# Patient Record
Sex: Male | Born: 1955 | Race: White | Hispanic: No | Marital: Married | Smoking: Never smoker
Health system: Southern US, Community
[De-identification: ages and names within clinical notes are randomized; demographics above are authoritative.]

## PROBLEM LIST (undated history)

## (undated) DIAGNOSIS — E039 Hypothyroidism, unspecified: Secondary | ICD-10-CM

## (undated) DIAGNOSIS — Z86018 Personal history of other benign neoplasm: Secondary | ICD-10-CM

## (undated) DIAGNOSIS — K219 Gastro-esophageal reflux disease without esophagitis: Secondary | ICD-10-CM

## (undated) DIAGNOSIS — Z85828 Personal history of other malignant neoplasm of skin: Secondary | ICD-10-CM

## (undated) HISTORY — DX: Personal history of other malignant neoplasm of skin: Z85.828

## (undated) HISTORY — PX: WISDOM TOOTH EXTRACTION: SHX21

## (undated) HISTORY — PX: ESOPHAGOGASTRODUODENOSCOPY (EGD) WITH ESOPHAGEAL DILATION: SHX5812

---
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
## 2006-09-25 ENCOUNTER — Ambulatory Visit: Payer: Self-pay | Admitting: Internal Medicine

## 2006-10-16 ENCOUNTER — Ambulatory Visit: Payer: Self-pay | Admitting: Internal Medicine

## 2006-10-16 LAB — HM COLONOSCOPY: HM COLON: NORMAL

## 2006-11-05 DIAGNOSIS — D239 Other benign neoplasm of skin, unspecified: Secondary | ICD-10-CM

## 2006-11-05 HISTORY — DX: Other benign neoplasm of skin, unspecified: D23.9

## 2012-10-30 ENCOUNTER — Ambulatory Visit: Payer: Self-pay | Admitting: Gastroenterology

## 2012-10-30 IMAGING — RF DG BARIUM SWALLOW
2 series · 8 of 8 positions shown · non-contrast
Comparison: none

REASON FOR EXAM: Dysphagia WIth Tablet
COMMENTS:

PROCEDURE:     FL  - FL BARIUM SWALLOW  - [DATE] [DATE]
RESULT:     Comparison: None.
TECHNIQUE: Standard double contrast barium swallow was performed, with
monitoring via abdomen fluoroscopy.

[Series 1: fluoro_barium 2fps_bw · 0.17mm/px · 4 of 14 frames shown (1 of 2)]
[frame 3/14]
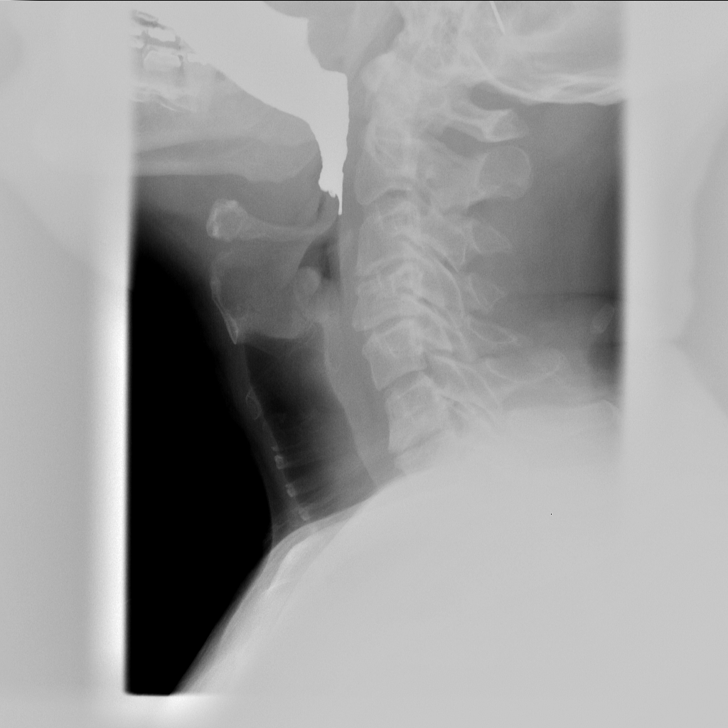
[frame 5/14]
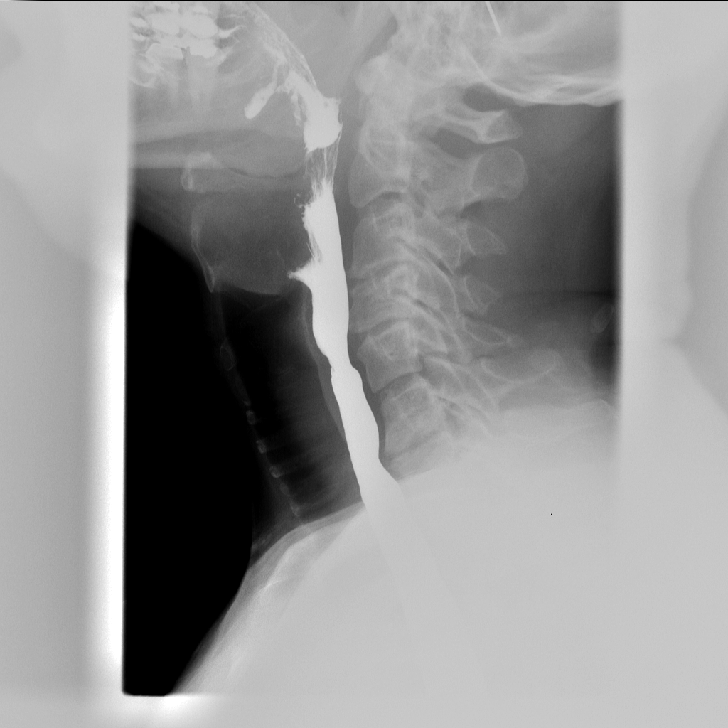
[frame 8/14]
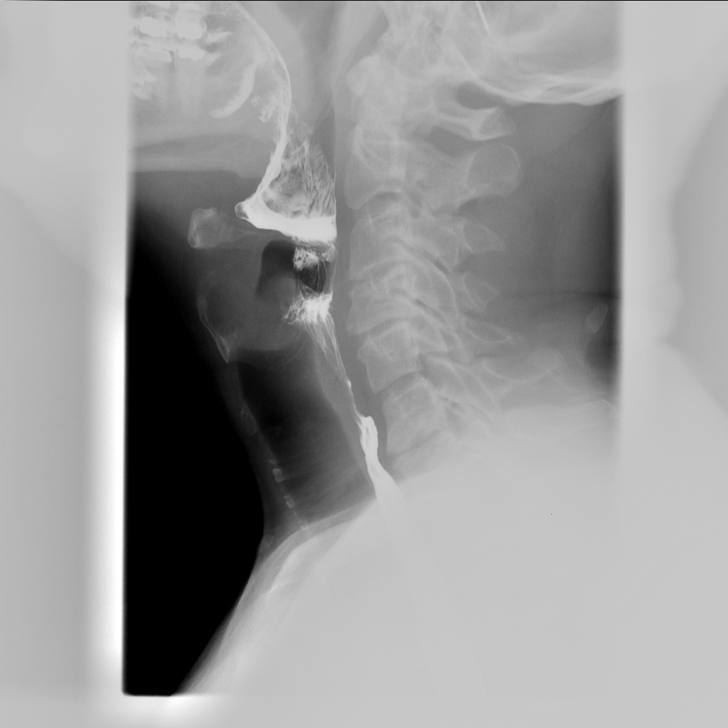
[frame 12/14]
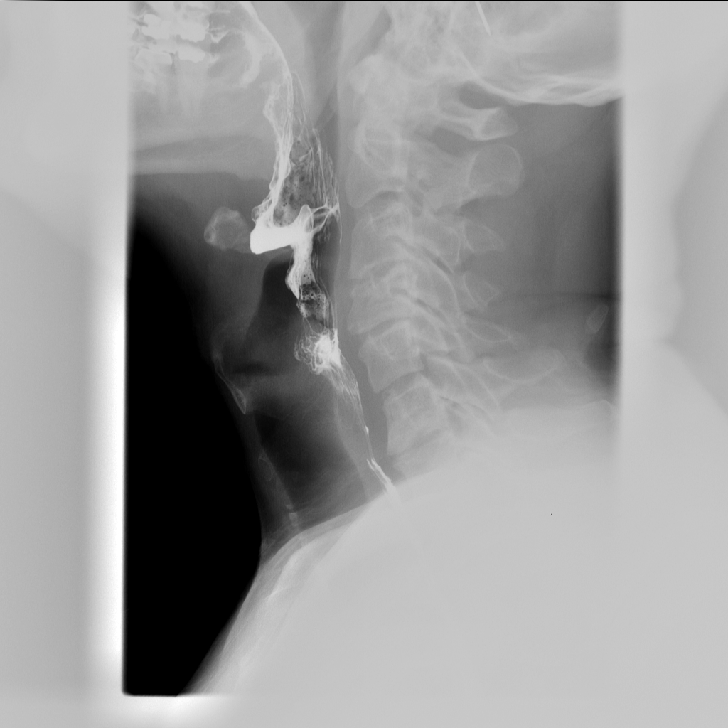

[Series 2: fluoro_barium 2fps_bw · 0.17mm/px · 4 of 12 frames shown (2 of 2)]
[frame 2/12]
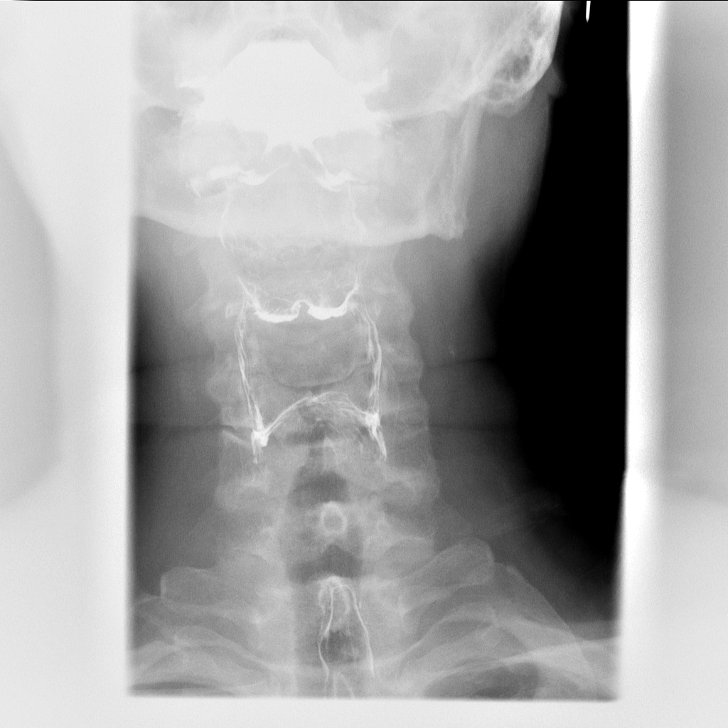
[frame 5/12]
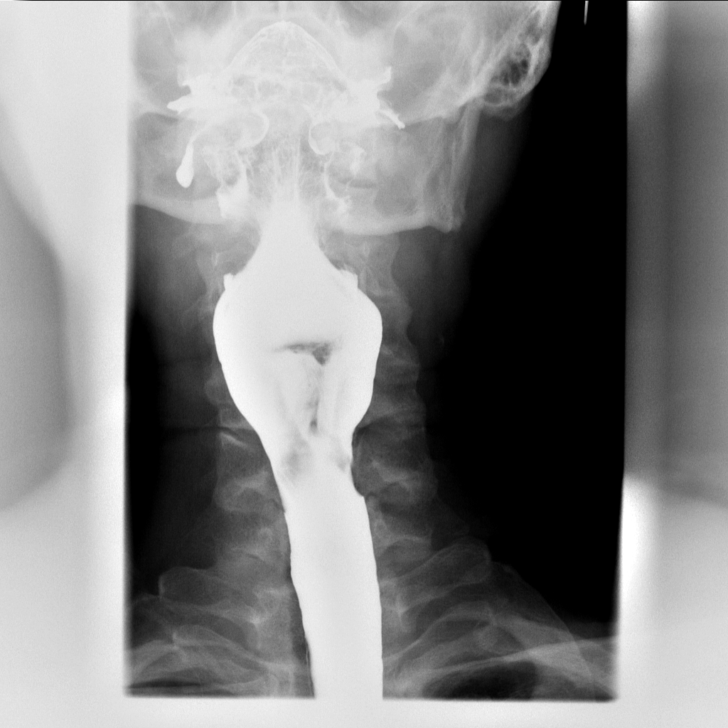
[frame 7/12]
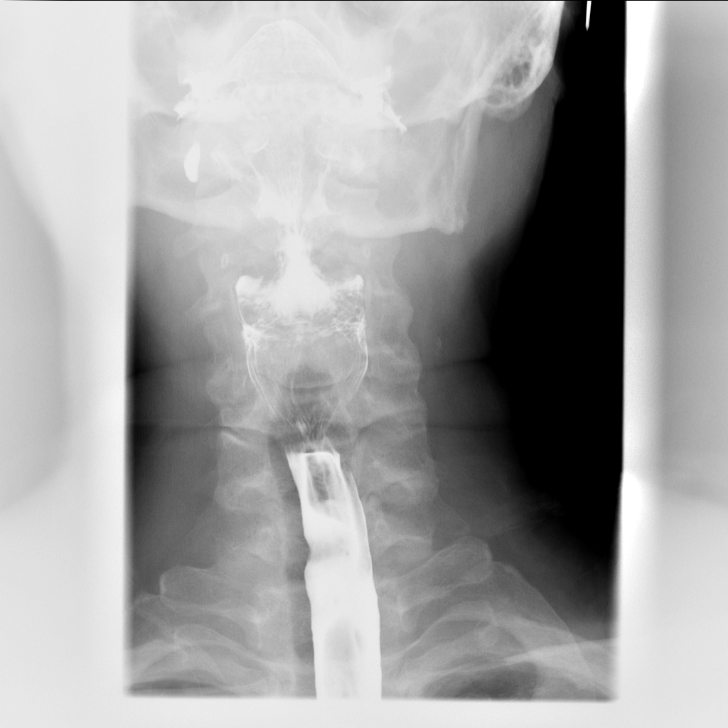
[frame 11/12]
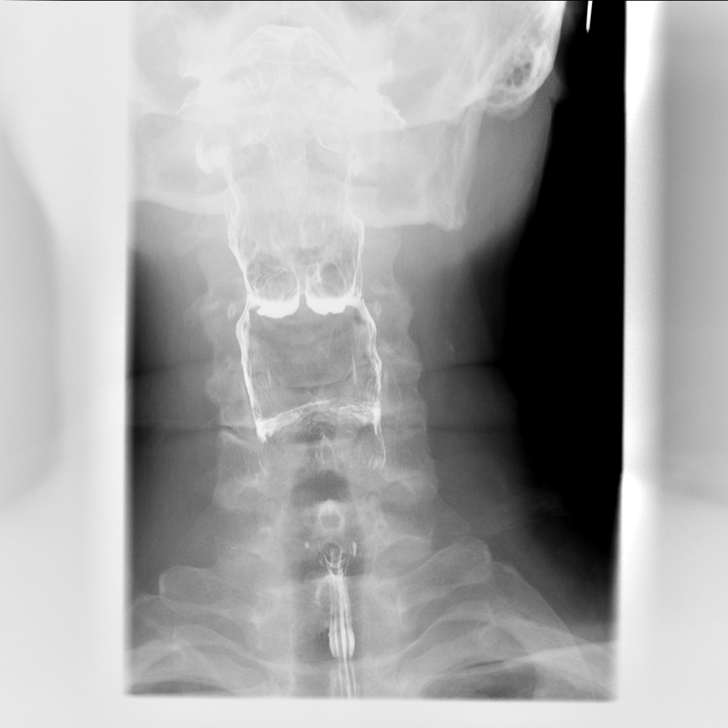

[8 of 8 positions shown; findings below may reference images not displayed]

FINDINGS: There is normal passage of barium contrast material through the hypopharynx
and into the cervical esophagus. Mild multilevel degenerative disc disease
is seen in the cervical spine.

The thoracic esophagus was normal in caliber. No intraluminal filling defect
or ulceration identified. There is a small sliding-type hiatal hernia. The
gastroesophageal junction was normal in caliber. There was gastroesophageal
reflux to the level of the superior thoracic esophagus. The patient
swallowed a 13 mm barium tablet which passed freely into the stomach.
IMPRESSION: 1. Small sliding-type hiatal hernia.
2. Full column gastroesophageal reflux.

## 2013-03-10 DIAGNOSIS — C4491 Basal cell carcinoma of skin, unspecified: Secondary | ICD-10-CM

## 2013-03-10 HISTORY — DX: Basal cell carcinoma of skin, unspecified: C44.91

## 2013-11-11 DIAGNOSIS — C439 Malignant melanoma of skin, unspecified: Secondary | ICD-10-CM

## 2013-11-11 DIAGNOSIS — Z85820 Personal history of malignant melanoma of skin: Secondary | ICD-10-CM

## 2013-11-11 HISTORY — DX: Personal history of malignant melanoma of skin: Z85.820

## 2013-11-11 HISTORY — DX: Malignant melanoma of skin, unspecified: C43.9

## 2013-11-24 ENCOUNTER — Encounter: Payer: Self-pay | Admitting: Dermatology

## 2013-11-24 HISTORY — PX: MELANOMA EXCISION: SHX5266

## 2014-10-19 LAB — TSH: TSH: 2.45 u[IU]/mL (ref 0.41–5.90)

## 2014-10-19 LAB — BASIC METABOLIC PANEL
BUN: 17 mg/dL (ref 4–21)
Creatinine: 1.2 mg/dL (ref 0.6–1.3)
Glucose: 96 mg/dL
POTASSIUM: 4.4 mmol/L (ref 3.4–5.3)
Sodium: 146 mmol/L (ref 137–147)

## 2014-10-19 LAB — CBC AND DIFFERENTIAL
HEMATOCRIT: 44 % (ref 41–53)
HEMOGLOBIN: 15.4 g/dL (ref 13.5–17.5)
Neutrophils Absolute: 3 /uL
PLATELETS: 247 10*3/uL (ref 150–399)
WBC: 5.7 10*3/mL

## 2014-10-19 LAB — LIPID PANEL
CHOLESTEROL: 194 mg/dL (ref 0–200)
HDL: 61 mg/dL (ref 35–70)
LDL CALC: 120 mg/dL
LDL/HDL RATIO: 2
TRIGLYCERIDES: 66 mg/dL (ref 40–160)

## 2014-10-19 LAB — HEPATIC FUNCTION PANEL
ALK PHOS: 55 U/L (ref 25–125)
ALT: 14 U/L (ref 10–40)
AST: 16 U/L (ref 14–40)
Bilirubin, Total: 0.5 mg/dL

## 2014-10-19 LAB — PSA: PSA: 1.1

## 2015-09-19 DIAGNOSIS — M545 Low back pain, unspecified: Secondary | ICD-10-CM | POA: Insufficient documentation

## 2015-09-19 DIAGNOSIS — E039 Hypothyroidism, unspecified: Secondary | ICD-10-CM | POA: Insufficient documentation

## 2015-09-19 DIAGNOSIS — E7800 Pure hypercholesterolemia, unspecified: Secondary | ICD-10-CM | POA: Insufficient documentation

## 2015-09-19 DIAGNOSIS — K219 Gastro-esophageal reflux disease without esophagitis: Secondary | ICD-10-CM | POA: Insufficient documentation

## 2015-09-19 DIAGNOSIS — G8929 Other chronic pain: Secondary | ICD-10-CM | POA: Insufficient documentation

## 2015-09-19 DIAGNOSIS — J309 Allergic rhinitis, unspecified: Secondary | ICD-10-CM | POA: Insufficient documentation

## 2015-09-19 DIAGNOSIS — R609 Edema, unspecified: Secondary | ICD-10-CM | POA: Insufficient documentation

## 2015-09-21 ENCOUNTER — Ambulatory Visit (INDEPENDENT_AMBULATORY_CARE_PROVIDER_SITE_OTHER): Payer: BLUE CROSS/BLUE SHIELD | Admitting: Family Medicine

## 2015-09-21 ENCOUNTER — Encounter: Payer: Self-pay | Admitting: Family Medicine

## 2015-09-21 VITALS — BP 118/78 | HR 66 | Temp 97.8°F | Resp 16 | Wt 210.0 lb

## 2015-09-21 DIAGNOSIS — I499 Cardiac arrhythmia, unspecified: Secondary | ICD-10-CM | POA: Diagnosis not present

## 2015-09-21 DIAGNOSIS — I493 Ventricular premature depolarization: Secondary | ICD-10-CM

## 2015-09-21 NOTE — Progress Notes (Signed)
Patient ID: [name], male   DOB: March 10, 1956, 59 y.o.   MRN: [id number]    Subjective:  HPI Pt is here today because he saw Dr. Myrna Blazer NP and she told him he had an irregular heart beat. He reports that he had been having some chest pain, burning and reflux but he thought it was all related to running out of his Protonix (which is why he went to see Dr. Candace Cruise). Denies any shortness of breath, dizziness, palpitations, or fatigue.  It is notable that the last few years of work up and very stressful for the patient. He frequently works 12 hour days. His wife is retiring early next year and he is planning to do so the next few years but right now the stress level remains high.  Prior to Admission medications   Medication Sig Start Date End Date Taking? Authorizing Provider  aspirin 81 MG tablet Take by mouth.   Yes Historical Provider, MD  levothyroxine (SYNTHROID, LEVOTHROID) 50 MCG tablet Take by mouth. 01/24/15  Yes Historical Provider, MD  Multiple Vitamins tablet Take by mouth.   Yes Historical Provider, MD  pantoprazole (PROTONIX) 40 MG tablet Take by mouth.   Yes Historical Provider, MD    Patient Active Problem List   Diagnosis Date Noted  . Allergic rhinitis 09/19/2015  . Chronic LBP 09/19/2015  . Acid reflux 09/19/2015  . Adult hypothyroidism 09/19/2015  . Edema 09/19/2015  . Pure hypercholesterolemia 09/19/2015    History reviewed. No pertinent past medical history.  Social History   Social History  . Marital Status: Married    Spouse Name: N/A  . Number of Children: N/A  . Years of Education: N/A   Occupational History  . Not on file.   Social History Main Topics  . Smoking status: Never Smoker   . Smokeless tobacco: Not on file  . Alcohol Use: Yes     Comment: occasionally weekends  . Drug Use: No  . Sexual Activity: Not on file   Other Topics Concern  . Not on file   Social History Narrative    No Known Allergies  Review of Systems  Constitutional:  Negative.   HENT: Negative.   Respiratory: Negative.   Cardiovascular: Positive for chest pain.  Gastrointestinal: Positive for heartburn.  Genitourinary: Negative.   Musculoskeletal: Negative.   Skin: Negative.   Neurological: Negative.   Endo/Heme/Allergies: Negative.   Psychiatric/Behavioral: Negative.     Immunization History  Administered Date(s) Administered  . Tdap 12/01/2006  . Zoster 10/19/2014   Objective:  BP 118/78 mmHg  Pulse 66  Temp(Src) 97.8 F (36.6 C) (Oral)  Resp 16  Wt 210 lb (95.255 kg)  SpO2 98%  Physical Exam  Constitutional: He is oriented to person, place, and time and well-developed, well-nourished, and in no distress.  HENT:  Head: Normocephalic and atraumatic.  Right Ear: External ear normal.  Left Ear: External ear normal.  Nose: Nose normal.  Mouth/Throat: Oropharynx is clear and moist.  Eyes: Conjunctivae and EOM are normal. Pupils are equal, round, and reactive to light.  Neck: Normal range of motion. Neck supple.  Cardiovascular: Normal rate, normal heart sounds and intact distal pulses.   Pulmonary/Chest: Effort normal and breath sounds normal.  Abdominal: Soft. Bowel sounds are normal.  Musculoskeletal: Normal range of motion.  Neurological: He is alert and oriented to person, place, and time. He has normal reflexes. Gait normal. GCS score is 15.  Skin: Skin is warm and dry.  Psychiatric:  Mood, memory, affect and judgment normal.    Lab Results  Component Value Date   WBC 5.7 10/19/2014   HGB 15.4 10/19/2014   HCT 44 10/19/2014   PLT 247 10/19/2014   CHOL 194 10/19/2014   TRIG 66 10/19/2014   HDL 61 10/19/2014   LDLCALC 120 10/19/2014   TSH 2.45 10/19/2014   PSA 1.1 10/19/2014    CMP     Component Value Date/Time   NA 146 10/19/2014   K 4.4 10/19/2014   BUN 17 10/19/2014   CREATININE 1.2 10/19/2014   AST 16 10/19/2014   ALT 14 10/19/2014   ALKPHOS 55 10/19/2014    Assessment and Plan :  1. Irregular heart  beat  - EKG 12-Lead - Ambulatory referral to Cardiology - TSH - Renal function panel  2. PVC (premature ventricular contraction) Refer to cardiology for evaluation for her overall cardiac risk assessment and cardiac health. He has already taken aspirin 81 mg daily. no further treatment noted or  needed today - Ambulatory referral to Cardiology I have done the exam and reviewed the above chart and it is accurate to the best of my knowledge.  Patient was seen and examined by Dr. Miguel Aschoff, and noted scribed by Webb Laws, [place] MD [facility] 09/21/2015 2:09 PM

## 2015-09-22 ENCOUNTER — Telehealth: Payer: Self-pay | Admitting: Family Medicine

## 2015-09-22 LAB — RENAL FUNCTION PANEL
Albumin: 4.3 g/dL (ref 3.6–4.8)
BUN / CREAT RATIO: 14 (ref 10–22)
BUN: 15 mg/dL (ref 8–27)
CHLORIDE: 105 mmol/L (ref 96–106)
CO2: 26 mmol/L (ref 18–29)
Calcium: 9.4 mg/dL (ref 8.6–10.2)
Creatinine, Ser: 1.07 mg/dL (ref 0.76–1.27)
GFR calc non Af Amer: 75 mL/min/{1.73_m2} (ref >59)
GFR, EST AFRICAN AMERICAN: 86 mL/min/{1.73_m2} (ref >59)
Glucose: 91 mg/dL (ref 65–99)
Phosphorus: 3.5 mg/dL (ref 2.5–4.5)
Potassium: 4.6 mmol/L (ref 3.5–5.2)
Sodium: 146 mmol/L — ABNORMAL HIGH (ref 134–144)

## 2015-09-22 LAB — TSH: TSH: 2.73 u[IU]/mL (ref 0.450–4.500)

## 2015-09-22 NOTE — Telephone Encounter (Signed)
Pt called saying he was in yesterday and had an EKG that was a little off.  That Dr. Rosanna Randy set him up an appt with a cardiologist at a later date.  He called this morning with a little tightness in his chest a funny feeling in his left hand.  I spoke to [place] and she recomened for his to go to the ER.  I relayed that to Me. Yoak and he said he would do that.  Thanks Con Memos

## 2015-09-22 NOTE — Telephone Encounter (Signed)
FYI

## 2015-09-26 ENCOUNTER — Telehealth: Payer: Self-pay

## 2015-09-26 NOTE — Telephone Encounter (Signed)
Left message to call back  

## 2015-09-26 NOTE — Telephone Encounter (Signed)
-----   Message from Jerrol Banana., MD sent at 09/26/2015  9:26 AM EST ----- Labs ok--normal range. Maybe increase water intake a little.

## 2015-09-27 NOTE — Telephone Encounter (Signed)
Pt advised-aa 

## 2015-10-05 DIAGNOSIS — E782 Mixed hyperlipidemia: Secondary | ICD-10-CM | POA: Insufficient documentation

## 2015-10-06 DIAGNOSIS — I493 Ventricular premature depolarization: Secondary | ICD-10-CM | POA: Insufficient documentation

## 2015-10-06 DIAGNOSIS — R079 Chest pain, unspecified: Secondary | ICD-10-CM | POA: Insufficient documentation

## 2015-10-06 DIAGNOSIS — R002 Palpitations: Secondary | ICD-10-CM | POA: Insufficient documentation

## 2015-10-12 ENCOUNTER — Encounter: Payer: Self-pay | Admitting: Family Medicine

## 2015-10-24 ENCOUNTER — Encounter: Payer: Self-pay | Admitting: Family Medicine

## 2015-10-24 ENCOUNTER — Ambulatory Visit (INDEPENDENT_AMBULATORY_CARE_PROVIDER_SITE_OTHER): Payer: BLUE CROSS/BLUE SHIELD | Admitting: Family Medicine

## 2015-10-24 VITALS — BP 124/78 | HR 78 | Temp 98.1°F | Resp 16 | Ht 74.0 in | Wt 211.0 lb

## 2015-10-24 DIAGNOSIS — Z1211 Encounter for screening for malignant neoplasm of colon: Secondary | ICD-10-CM | POA: Diagnosis not present

## 2015-10-24 DIAGNOSIS — Z125 Encounter for screening for malignant neoplasm of prostate: Secondary | ICD-10-CM | POA: Diagnosis not present

## 2015-10-24 DIAGNOSIS — Z0000 Encounter for general adult medical examination without abnormal findings: Secondary | ICD-10-CM

## 2015-10-24 LAB — POCT URINALYSIS DIPSTICK
Bilirubin, UA: NEGATIVE
GLUCOSE UA: NEGATIVE
Ketones, UA: NEGATIVE
Leukocytes, UA: NEGATIVE
NITRITE UA: NEGATIVE
PROTEIN UA: NEGATIVE
RBC UA: NEGATIVE
SPEC GRAV UA: 1.025
UROBILINOGEN UA: 0.2
pH, UA: 6

## 2015-10-24 LAB — IFOBT (OCCULT BLOOD): IFOBT: NEGATIVE

## 2015-10-24 NOTE — Progress Notes (Signed)
Patient ID: [name], male   DOB: 01/01/1956, 60 y.o.   MRN: [id number]       Patient: [name], Male    DOB: [birth year], 60 y.o.   MRN: [id number] Visit Date: 10/24/2015  Today's Provider: Wilhemena Durie, MD   Chief Complaint  Patient presents with  . Annual Exam   Subjective:    Annual physical exam [name] is a 60 y.o. male who presents today for health maintenance and complete physical. He feels fairly well. He reports he is not exercising. He reports he is sleeping well.  ----------------------------------------------------------------- Colonoscopy- 10/16/06 EKG- 09/21/15 Tdap- 12/01/06 Zoster- 10/19/14  Review of Systems  Constitutional: Negative.   HENT: Negative.   Eyes: Negative.   Respiratory: Negative.   Cardiovascular: Negative.   Gastrointestinal: Negative.   Endocrine: Negative.   Genitourinary: Negative.   Musculoskeletal: Negative.   Skin: Negative.   Allergic/Immunologic: Negative.   Neurological: Negative.   Hematological: Negative.   Psychiatric/Behavioral: Negative.     Social History      He  reports that he has never smoked. He does not have any smokeless tobacco history on file. He reports that he drinks alcohol. He reports that he does not use illicit drugs.       Social History   Social History  . Marital Status: Married    Spouse Name: N/A  . Number of Children: N/A  . Years of Education: N/A   Social History Main Topics  . Smoking status: Never Smoker   . Smokeless tobacco: None  . Alcohol Use: Yes     Comment: occasionally weekends  . Drug Use: No  . Sexual Activity: Not Asked   Other Topics Concern  . None   Social History Narrative    History reviewed. No pertinent past medical history.   Patient Active Problem List   Diagnosis Date Noted  . Allergic rhinitis 09/19/2015  . Chronic LBP 09/19/2015  . Acid reflux 09/19/2015  . Adult hypothyroidism 09/19/2015  . Edema 09/19/2015  . Pure  hypercholesterolemia 09/19/2015    History reviewed. No pertinent past surgical history.  Family History        Family Status  Relation Status Death Age  . Mother Alive   . Father Deceased 95  . Brother Alive         His family history includes Heart attack in his father; Heart disease in his father; Lung cancer in his father.    No Known Allergies  Previous Medications   ASPIRIN 81 MG TABLET    Take by mouth.   LEVOTHYROXINE (SYNTHROID, LEVOTHROID) 50 MCG TABLET    Take by mouth.   MULTIPLE VITAMINS TABLET    Take by mouth.   PANTOPRAZOLE (PROTONIX) 40 MG TABLET    Take by mouth.    Patient Care Team: Jerrol Banana., MD as PCP - General (Family Medicine)     Objective:   Vitals: BP 124/78 mmHg  Pulse 78  Temp(Src) 98.1 F (36.7 C) (Oral)  Resp 16  Ht 6\' 2"  (1.88 m)  Wt 211 lb (95.709 kg)  BMI 27.08 kg/m2   Physical Exam  Constitutional: He is oriented to person, place, and time. He appears well-developed and well-nourished.  HENT:  Head: Normocephalic and atraumatic.  Right Ear: External ear normal.  Left Ear: External ear normal.  Nose: Nose normal.  Mouth/Throat: Oropharynx is clear and moist.  Eyes: Conjunctivae and EOM are normal. Pupils are equal, round, and  reactive to light.  Neck: Normal range of motion. Neck supple.  Cardiovascular: Normal rate, regular rhythm, normal heart sounds and intact distal pulses.   Pulmonary/Chest: Effort normal and breath sounds normal.  Abdominal: Soft. Bowel sounds are normal.  Genitourinary: Rectum normal, prostate normal and penis normal.  Musculoskeletal: Normal range of motion.  Neurological: He is alert and oriented to person, place, and time. He has normal reflexes.  Skin: Skin is warm and dry.  Psychiatric: He has a normal mood and affect. His behavior is normal. Judgment and thought content normal.     Depression Screen No flowsheet data found.    Assessment & Plan:     Routine Health  Maintenance and Physical Exam  Exercise Activities and Dietary recommendations Goals    None      Immunization History  Administered Date(s) Administered  . Influenza-Unspecified 08/07/2015  . Tdap 12/01/2006  . Zoster 10/19/2014    Health Maintenance  Topic Date Due  . Hepatitis C Screening  05-Feb-1956  . HIV Screening  11/25/1968  . INFLUENZA VACCINE  09/22/2016 (Originally 05/08/2015)  . COLONOSCOPY  10/16/2016  . TETANUS/TDAP  12/01/2016  . ZOSTAVAX  Completed     His wife is retiring March 31 and it is of note the patient is continued considering retirement in the next year or 2. Discussed health benefits of physical activity, and encouraged him to engage in regular exercise appropriate for his age and condition.    --------------------------------------------------------------------

## 2015-10-26 LAB — CBC WITH DIFFERENTIAL/PLATELET
BASOS: 0 %
Basophils Absolute: 0 10*3/uL (ref 0.0–0.2)
EOS (ABSOLUTE): 0.1 10*3/uL (ref 0.0–0.4)
Eos: 3 %
HEMATOCRIT: 44.2 % (ref 37.5–51.0)
Hemoglobin: 14.8 g/dL (ref 12.6–17.7)
IMMATURE GRANULOCYTES: 1 %
Immature Grans (Abs): 0 10*3/uL (ref 0.0–0.1)
LYMPHS ABS: 2.1 10*3/uL (ref 0.7–3.1)
Lymphs: 42 %
MCH: 30.8 pg (ref 26.6–33.0)
MCHC: 33.5 g/dL (ref 31.5–35.7)
MCV: 92 fL (ref 79–97)
MONOS ABS: 0.4 10*3/uL (ref 0.1–0.9)
Monocytes: 8 %
NEUTROS PCT: 46 %
Neutrophils Absolute: 2.4 10*3/uL (ref 1.4–7.0)
PLATELETS: 242 10*3/uL (ref 150–379)
RBC: 4.8 x10E6/uL (ref 4.14–5.80)
RDW: 14.6 % (ref 12.3–15.4)
WBC: 5.1 10*3/uL (ref 3.4–10.8)

## 2015-10-26 LAB — LIPID PANEL WITH LDL/HDL RATIO
Cholesterol, Total: 193 mg/dL (ref 100–199)
HDL: 63 mg/dL (ref >39)
LDL Calculated: 115 mg/dL — ABNORMAL HIGH (ref 0–99)
LDL/HDL RATIO: 1.8 ratio (ref 0.0–3.6)
Triglycerides: 73 mg/dL (ref 0–149)
VLDL Cholesterol Cal: 15 mg/dL (ref 5–40)

## 2015-10-26 LAB — COMPREHENSIVE METABOLIC PANEL
ALBUMIN: 4.3 g/dL (ref 3.6–4.8)
ALT: 18 IU/L (ref 0–44)
AST: 17 IU/L (ref 0–40)
Albumin/Globulin Ratio: 2.3 (ref 1.1–2.5)
Alkaline Phosphatase: 52 IU/L (ref 39–117)
BILIRUBIN TOTAL: 0.4 mg/dL (ref 0.0–1.2)
BUN/Creatinine Ratio: 13 (ref 10–22)
BUN: 15 mg/dL (ref 8–27)
CALCIUM: 9.5 mg/dL (ref 8.6–10.2)
CHLORIDE: 104 mmol/L (ref 96–106)
CO2: 25 mmol/L (ref 18–29)
CREATININE: 1.13 mg/dL (ref 0.76–1.27)
GFR, EST AFRICAN AMERICAN: 81 mL/min/{1.73_m2} (ref >59)
GFR, EST NON AFRICAN AMERICAN: 70 mL/min/{1.73_m2} (ref >59)
GLUCOSE: 98 mg/dL (ref 65–99)
Globulin, Total: 1.9 g/dL (ref 1.5–4.5)
Potassium: 5.1 mmol/L (ref 3.5–5.2)
Sodium: 144 mmol/L (ref 134–144)
TOTAL PROTEIN: 6.2 g/dL (ref 6.0–8.5)

## 2015-10-26 LAB — PSA: PROSTATE SPECIFIC AG, SERUM: 1.3 ng/mL (ref 0.0–4.0)

## 2015-10-26 LAB — TSH: TSH: 2.99 u[IU]/mL (ref 0.450–4.500)

## 2015-10-27 ENCOUNTER — Telehealth: Payer: Self-pay

## 2015-10-27 NOTE — Telephone Encounter (Signed)
LMTCB

## 2015-10-27 NOTE — Telephone Encounter (Signed)
Patient advised as directed below. 

## 2015-10-27 NOTE — Telephone Encounter (Signed)
-----   Message from Jerrol Banana., MD sent at 10/27/2015  8:26 AM EST ----- Labs WNL

## 2015-11-06 DIAGNOSIS — I872 Venous insufficiency (chronic) (peripheral): Secondary | ICD-10-CM | POA: Insufficient documentation

## 2016-02-13 ENCOUNTER — Other Ambulatory Visit: Payer: Self-pay | Admitting: Family Medicine

## 2016-04-23 ENCOUNTER — Encounter: Payer: Self-pay | Admitting: Family Medicine

## 2016-04-23 ENCOUNTER — Ambulatory Visit (INDEPENDENT_AMBULATORY_CARE_PROVIDER_SITE_OTHER): Payer: BLUE CROSS/BLUE SHIELD | Admitting: Family Medicine

## 2016-04-23 VITALS — BP 118/80 | HR 76 | Temp 97.7°F | Resp 16 | Wt 213.0 lb

## 2016-04-23 DIAGNOSIS — J4521 Mild intermittent asthma with (acute) exacerbation: Secondary | ICD-10-CM

## 2016-04-23 DIAGNOSIS — E039 Hypothyroidism, unspecified: Secondary | ICD-10-CM

## 2016-04-23 DIAGNOSIS — G5792 Unspecified mononeuropathy of left lower limb: Secondary | ICD-10-CM

## 2016-04-23 DIAGNOSIS — K219 Gastro-esophageal reflux disease without esophagitis: Secondary | ICD-10-CM | POA: Diagnosis not present

## 2016-04-23 DIAGNOSIS — J45909 Unspecified asthma, uncomplicated: Secondary | ICD-10-CM | POA: Insufficient documentation

## 2016-04-23 DIAGNOSIS — M792 Neuralgia and neuritis, unspecified: Secondary | ICD-10-CM | POA: Insufficient documentation

## 2016-04-23 NOTE — Progress Notes (Signed)
Patient: [name] Male    DOB: 02-21-1956   60 y.o.   MRN: [id number] Visit Date: 04/23/2016  Today's Provider: Wilhemena Durie, MD   Chief Complaint  Patient presents with  . Hypothyroidism  . Gastroesophageal Reflux  . Foot Pain   Subjective:    HPI      Hypothyroid, follow-up:  TSH  Date Value Ref Range Status  10/25/2015 2.990 0.450 - 4.500 uIU/mL Final  09/21/2015 2.730 0.450 - 4.500 uIU/mL Final  10/19/2014 2.45 0.41 - 5.90 uIU/mL Final   Wt Readings from Last 3 Encounters:  04/23/16 213 lb (96.616 kg)  10/24/15 211 lb (95.709 kg)  09/21/15 210 lb (95.255 kg)    He was last seen for hypothyroid 6 months ago.  Management since that visit includes continuing medication Levothyroxine 50 mcg. He reports good compliance with treatment. Did [place] to take medications [place]. He is not having side effects.  He is not exercising. He is experiencing, as far as symptoms, none. He denies change in energy level, diarrhea, heat / cold intolerance, nervousness, palpitations and weight changes Weight trend: stable  ------------------------------------------------------------------------   GERD, Follow up:  The patient was last seen for GERD 6 months ago. Changes made since that visit include continuing Protonix 40 mg.  He reports excellent compliance with treatment. He is not having side effects.   He IS experiencing abdominal bloating, belching and cough. He is NOT experiencing chest pain, choking on food, dysphagia, heartburn, melena, nausea, need to clear throat frequently, shortness of breath or unexpected weight loss  ------------------------------------------------------------------------   Heel Pain Pt also states he is experiencing left heel "tingling, prickly sensation that "feels hot". Is intermittent and occurs infrequently.  No Known Allergies Current Meds  Medication Sig  . aspirin 81 MG tablet  Take by mouth.  . levothyroxine (SYNTHROID, LEVOTHROID) 50 MCG tablet take 1 tablet by mouth once daily  . Multiple Vitamins tablet Take by mouth.  . pantoprazole (PROTONIX) 40 MG tablet Take by mouth.    Review of Systems  Constitutional: Negative for fever, chills, diaphoresis, activity change, appetite change, fatigue and unexpected weight change.  HENT: Negative.   Eyes: Negative.   Respiratory: Positive for cough ( has noticed this after working out in the heat this weekend ).   Cardiovascular: Negative for chest pain, palpitations and leg swelling.  Endocrine: Negative for cold intolerance and heat intolerance.  Allergic/Immunologic: Negative.   Neurological: Negative.   Psychiatric/Behavioral: Negative.     Social History  Substance Use Topics  . Smoking status: Never Smoker   . Smokeless tobacco: Not on file  . Alcohol Use: Yes     Comment: occasionally weekends   Objective:   BP 118/80 mmHg  Pulse 76  Temp(Src) 97.7 F (36.5 C) (Oral)  Resp 16  Wt 213 lb (96.616 kg)  Physical Exam  Constitutional: He appears well-developed and well-nourished.  HENT:  Head: Normocephalic and atraumatic.  Mouth/Throat: Oropharynx is clear and moist.  Eyes: Conjunctivae and EOM are normal. Pupils are equal, round, and reactive to light.  Cardiovascular: Normal rate, regular rhythm and normal heart sounds.   Pulmonary/Chest: Effort normal and breath sounds normal. No respiratory distress. He has no wheezes. He has no rales.  Abdominal: Soft. Bowel sounds are normal. He exhibits no distension.  Musculoskeletal: He exhibits no tenderness (no tenderness or numbness over left foot. ).  Psychiatric: He has a normal  mood and affect. His behavior is normal.      Assessment & Plan:     1. Hypothyroidism, unspecified hypothyroidism type Stable. Continue current medications and recheck in 6 months at CPE.  2. Gastroesophageal reflux disease, esophagitis presence not specified Stable.  Continue medications. Controlled. Flares without medication. I actually think this will improve after he retires which he will do in the next year. He feels that his job is extremely high stress. 3. Foot neuralgia, left New problem. Previous labs WNL. FU PRN.  4. Reactive airway disease, mild intermittent, with acute exacerbation Most likely cause of cough. Pt reports this is stable, and declines inhaler at this time. FU PRN.     Patient seen and examined by Miguel Aschoff, MD, and note scribed by Renaldo Fiddler, CMA.   Richard Cranford Mon, MD  [facility].hpt

## 2016-10-29 ENCOUNTER — Encounter: Payer: Self-pay | Admitting: Family Medicine

## 2016-10-29 ENCOUNTER — Ambulatory Visit (INDEPENDENT_AMBULATORY_CARE_PROVIDER_SITE_OTHER): Payer: BLUE CROSS/BLUE SHIELD | Admitting: Family Medicine

## 2016-10-29 VITALS — BP 124/76 | HR 76 | Temp 98.5°F | Resp 14 | Ht 73.0 in | Wt 216.0 lb

## 2016-10-29 DIAGNOSIS — K219 Gastro-esophageal reflux disease without esophagitis: Secondary | ICD-10-CM | POA: Diagnosis not present

## 2016-10-29 DIAGNOSIS — Z125 Encounter for screening for malignant neoplasm of prostate: Secondary | ICD-10-CM | POA: Diagnosis not present

## 2016-10-29 DIAGNOSIS — Z0000 Encounter for general adult medical examination without abnormal findings: Secondary | ICD-10-CM

## 2016-10-29 DIAGNOSIS — Z23 Encounter for immunization: Secondary | ICD-10-CM

## 2016-10-29 LAB — POCT URINALYSIS DIPSTICK
Bilirubin, UA: NEGATIVE
Glucose, UA: NEGATIVE
Ketones, UA: NEGATIVE
Leukocytes, UA: NEGATIVE
Nitrite, UA: NEGATIVE
PH UA: 6
RBC UA: NEGATIVE
SPEC GRAV UA: 1.015
UROBILINOGEN UA: 0.2

## 2016-10-29 NOTE — Progress Notes (Signed)
Patient: [name], Male    DOB: 20-Jul-1956, 61 y.o.   MRN: [id number] Visit Date: 10/29/2016  Today's Provider: Wilhemena Durie, MD   Chief Complaint  Patient presents with  . Annual Exam   Subjective:  [name] is a 61 y.o. male who presents today for health maintenance and complete physical. He feels well. He reports exercising not right now. He reports he is sleeping well. HE has been seen GI specialist to follow up on Pantoprazole and having this filled through them. He has colonoscopy scheduled in April 2018. Patient is still working but plans to retire by the end of the year. Immunization History  Administered Date(s) Administered  . Influenza-Unspecified 08/07/2015  . Tdap 12/01/2006  . Zoster 10/19/2014  Last colonoscopy 10/16/06 normal  Review of Systems  Constitutional: Negative.   HENT: Negative.   Eyes: Negative.   Respiratory: Negative.   Cardiovascular: Negative.   Gastrointestinal: Negative.   Endocrine: Negative.   Genitourinary: Negative.   Musculoskeletal: Negative.   Skin: Negative.   Allergic/Immunologic: Negative.   Neurological: Negative.   Hematological: Negative.   Psychiatric/Behavioral: Negative.     Social History   Social History  . Marital status: Married    Spouse name: N/A  . Number of children: N/A  . Years of education: N/A   Occupational History  . Not on file.   Social History Main Topics  . Smoking status: Never Smoker  . Smokeless tobacco: Never Used  . Alcohol use Yes     Comment: about 1 glass of wine with dinner  . Drug use: No  . Sexual activity: Not on file   Other Topics Concern  . Not on file   Social History Narrative  . No narrative on file    Patient Active Problem List   Diagnosis Date Noted  . 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 Reactive airway disease 04/23/2016  . Venous insufficiency of leg 11/06/2015  . Allergic rhinitis 09/19/2015  . Chronic LBP 09/19/2015  . Acid reflux 09/19/2015  .  Adult hypothyroidism 09/19/2015  . Edema 09/19/2015  . Pure hypercholesterolemia 09/19/2015    Past Surgical History:  Procedure Laterality Date  . WISDOM TOOTH EXTRACTION      His family history includes Heart attack in his father; Heart disease in his father; Hyperlipidemia in his mother; Lung cancer in his father; Macular degeneration in his mother.     Outpatient Encounter Prescriptions as of 10/29/2016  Medication Sig Note  . aspirin 81 MG tablet Take by mouth. 09/19/2015: Received from: [facility]  . levothyroxine (SYNTHROID, LEVOTHROID) 50 MCG tablet take 1 tablet by mouth once daily   . Multiple Vitamins tablet Take by mouth. 09/19/2015: Received from: [facility]  . FLUARIX QUADRIVALENT 0.5 ML injection inject 0.5 milliliter intramuscularly   . pantoprazole (PROTONIX) 20 MG tablet Take 1 tablet by mouth daily.   . [DISCONTINUED] pantoprazole (PROTONIX) 40 MG tablet Take by mouth. 09/19/2015: Received from: [facility]   No facility-administered encounter medications on file as of 10/29/2016.     Patient Care Team: Jerrol Banana., MD as PCP - General (Family Medicine)      Objective:   Vitals:  Vitals:   10/29/16 0853  BP: 124/76  Pulse: 76  Resp: 14  Temp: 98.5 F (36.9 C)  Weight: 216 lb (98 kg)  Height: 6\' 1"  (1.854 m)    Physical Exam  Constitutional: He is oriented to person, place, and time.  He appears well-developed and well-nourished.  HENT:  Head: Normocephalic and atraumatic.  Right Ear: External ear normal.  Left Ear: External ear normal.  Mouth/Throat: Oropharynx is clear and moist.  Eyes: Conjunctivae are normal. Pupils are equal, round, and reactive to light.  Neck: Normal range of motion. Neck supple.  Cardiovascular: Normal rate, regular rhythm, normal heart sounds and intact distal pulses.   No murmur heard. Pulmonary/Chest: Effort normal and breath sounds normal. No respiratory  distress. He has no wheezes.  Abdominal: Soft. He exhibits no distension. There is no tenderness.  Genitourinary: Penis normal.  Musculoskeletal: He exhibits no edema or tenderness.  Mild bilateral Dupuytren's contracture of both fourth fingers  Neurological: He is alert and oriented to person, place, and time. No cranial nerve deficit. Coordination normal.  Skin: Skin is warm and dry. No rash noted. No erythema.  Patient has a corn on the right medial fifth toe.  Psychiatric: He has a normal mood and affect. His behavior is normal. Judgment and thought content normal.    Assessment & Plan:   1. Annual physical exam Has colonoscopy scheduled for April 2018.  CBC w/Diff/Platelet - Comprehensive metabolic panel - TSH - Lipid Panel With LDL/HDL Ratio - POCT urinalysis dipstick  2. Prostate cancer screening - PSA  3. Gastroesophageal reflux disease, esophagitis presence not specified Follows GI.  4. Need for immunization against tetanus alone Td administered today. HPI, Exam and A&P transcribed under direction and in the presence of Miguel Aschoff, MD. 5. Dupuytren's contractures of both hands 6. Corn of the fifth toe on right foot  I have done the exam and reviewed the chart and it is accurate to the best of my knowledge. Development worker, community has been used and  any errors in dictation or transcription are unintentional. Miguel Aschoff M.D. [facility]

## 2016-10-30 LAB — LIPID PANEL WITH LDL/HDL RATIO
Cholesterol, Total: 199 mg/dL (ref 100–199)
HDL: 57 mg/dL (ref >39)
LDL Calculated: 129 mg/dL — ABNORMAL HIGH (ref 0–99)
LDl/HDL Ratio: 2.3 ratio units (ref 0.0–3.6)
Triglycerides: 66 mg/dL (ref 0–149)
VLDL Cholesterol Cal: 13 mg/dL (ref 5–40)

## 2016-10-30 LAB — COMPREHENSIVE METABOLIC PANEL
ALBUMIN: 4.3 g/dL (ref 3.6–4.8)
ALK PHOS: 60 IU/L (ref 39–117)
ALT: 21 IU/L (ref 0–44)
AST: 18 IU/L (ref 0–40)
Albumin/Globulin Ratio: 2 (ref 1.2–2.2)
BILIRUBIN TOTAL: 0.5 mg/dL (ref 0.0–1.2)
BUN / CREAT RATIO: 13 (ref 10–24)
BUN: 17 mg/dL (ref 8–27)
CO2: 27 mmol/L (ref 18–29)
Calcium: 9.4 mg/dL (ref 8.6–10.2)
Chloride: 102 mmol/L (ref 96–106)
Creatinine, Ser: 1.26 mg/dL (ref 0.76–1.27)
GFR calc Af Amer: 70 mL/min/{1.73_m2} (ref >59)
GFR calc non Af Amer: 61 mL/min/{1.73_m2} (ref >59)
GLOBULIN, TOTAL: 2.1 g/dL (ref 1.5–4.5)
GLUCOSE: 90 mg/dL (ref 65–99)
Potassium: 4.6 mmol/L (ref 3.5–5.2)
SODIUM: 144 mmol/L (ref 134–144)
Total Protein: 6.4 g/dL (ref 6.0–8.5)

## 2016-10-30 LAB — CBC WITH DIFFERENTIAL/PLATELET
BASOS ABS: 0 10*3/uL (ref 0.0–0.2)
Basos: 0 %
EOS (ABSOLUTE): 0.1 10*3/uL (ref 0.0–0.4)
Eos: 2 %
HEMOGLOBIN: 14.9 g/dL (ref 13.0–17.7)
Hematocrit: 44.6 % (ref 37.5–51.0)
Immature Grans (Abs): 0 10*3/uL (ref 0.0–0.1)
Immature Granulocytes: 1 %
LYMPHS ABS: 2 10*3/uL (ref 0.7–3.1)
Lymphs: 39 %
MCH: 30.4 pg (ref 26.6–33.0)
MCHC: 33.4 g/dL (ref 31.5–35.7)
MCV: 91 fL (ref 79–97)
MONOCYTES: 10 %
MONOS ABS: 0.5 10*3/uL (ref 0.1–0.9)
NEUTROS ABS: 2.5 10*3/uL (ref 1.4–7.0)
Neutrophils: 48 %
Platelets: 234 10*3/uL (ref 150–379)
RBC: 4.9 x10E6/uL (ref 4.14–5.80)
RDW: 14.2 % (ref 12.3–15.4)
WBC: 5.2 10*3/uL (ref 3.4–10.8)

## 2016-10-30 LAB — TSH: TSH: 2.61 u[IU]/mL (ref 0.450–4.500)

## 2016-10-30 LAB — PSA: PROSTATE SPECIFIC AG, SERUM: 1.3 ng/mL (ref 0.0–4.0)

## 2016-10-31 ENCOUNTER — Telehealth: Payer: Self-pay

## 2016-10-31 NOTE — Telephone Encounter (Signed)
-----   Message from Jerrol Banana., MD sent at 10/30/2016  4:39 PM EST ----- Labs stable.

## 2016-10-31 NOTE — Telephone Encounter (Signed)
Pt advised.   Thanks,   -Tamsin Nader  

## 2017-01-17 ENCOUNTER — Encounter: Payer: Self-pay | Admitting: *Deleted

## 2017-01-20 ENCOUNTER — Encounter: Payer: Self-pay | Admitting: *Deleted

## 2017-01-20 ENCOUNTER — Encounter
Admission: RE | Disposition: A | Discharge status: Home / Self Care | Payer: Self-pay | Source: Ambulatory Visit | Attending: Gastroenterology

## 2017-01-20 ENCOUNTER — Ambulatory Visit
Admission: RE | Admit: 2017-01-20 | Discharge: 2017-01-20 | Disposition: A | Discharge status: Home / Self Care | Payer: BLUE CROSS/BLUE SHIELD | Source: Ambulatory Visit | Attending: Gastroenterology | Admitting: Gastroenterology

## 2017-01-20 ENCOUNTER — Ambulatory Visit: Payer: BLUE CROSS/BLUE SHIELD | Admitting: Anesthesiology

## 2017-01-20 DIAGNOSIS — K219 Gastro-esophageal reflux disease without esophagitis: Secondary | ICD-10-CM | POA: Diagnosis not present

## 2017-01-20 DIAGNOSIS — E039 Hypothyroidism, unspecified: Secondary | ICD-10-CM | POA: Insufficient documentation

## 2017-01-20 DIAGNOSIS — Z1211 Encounter for screening for malignant neoplasm of colon: Secondary | ICD-10-CM | POA: Diagnosis not present

## 2017-01-20 DIAGNOSIS — Z7982 Long term (current) use of aspirin: Secondary | ICD-10-CM | POA: Insufficient documentation

## 2017-01-20 HISTORY — DX: Hypothyroidism, unspecified: E03.9

## 2017-01-20 HISTORY — DX: Gastro-esophageal reflux disease without esophagitis: K21.9

## 2017-01-20 HISTORY — PX: COLONOSCOPY WITH PROPOFOL: SHX5780

## 2017-01-20 LAB — HM COLONOSCOPY

## 2017-01-20 SURGERY — COLONOSCOPY WITH PROPOFOL
Anesthesia: General

## 2017-01-20 MED ORDER — SODIUM CHLORIDE 0.9 % IV SOLN: INTRAVENOUS | Status: DC

## 2017-01-20 MED ORDER — SODIUM CHLORIDE 0.9 % IV SOLN
INTRAVENOUS | Status: DC
  Administered 2017-01-20: 09:00:00 via INTRAVENOUS

## 2017-01-20 MED ORDER — FENTANYL CITRATE (PF) 100 MCG/2ML IJ SOLN
INTRAMUSCULAR | Status: AC
  Filled 2017-01-20: qty 2

## 2017-01-20 MED ORDER — PROPOFOL 500 MG/50ML IV EMUL
INTRAVENOUS | Status: AC
  Filled 2017-01-20: qty 50

## 2017-01-20 MED ORDER — FENTANYL CITRATE (PF) 100 MCG/2ML IJ SOLN
INTRAMUSCULAR | Status: DC | PRN
  Administered 2017-01-20: 50 ug via INTRAVENOUS

## 2017-01-20 MED ORDER — MIDAZOLAM HCL 2 MG/2ML IJ SOLN
INTRAMUSCULAR | Status: AC
  Filled 2017-01-20: qty 2

## 2017-01-20 MED ORDER — PROPOFOL 500 MG/50ML IV EMUL
INTRAVENOUS | Status: DC | PRN
  Administered 2017-01-20: 120 ug/kg/min via INTRAVENOUS

## 2017-01-20 MED ORDER — MIDAZOLAM HCL 2 MG/2ML IJ SOLN
INTRAMUSCULAR | Status: DC | PRN
  Administered 2017-01-20: 2 mg via INTRAVENOUS

## 2017-01-20 NOTE — Anesthesia Procedure Notes (Signed)
Performed by: COOK-MARTIN, Charlot Gouin Pre-anesthesia Checklist: Patient identified, Emergency Drugs available, Suction available, Patient being monitored and Timeout performed Patient Re-evaluated:Patient Re-evaluated prior to inductionOxygen Delivery Method: Nasal cannula Preoxygenation: Pre-oxygenation with 100% oxygen Intubation Type: IV induction Placement Confirmation: positive ETCO2 and CO2 detector       

## 2017-01-20 NOTE — Transfer of Care (Signed)
Immediate Anesthesia Transfer of Care Note  Patient: [name] [facility]  Procedure(s) Performed: Procedure(s): COLONOSCOPY WITH PROPOFOL (N/A)  Patient Location: PACU  Anesthesia Type:General  Level of Consciousness: awake and sedated  Airway & Oxygen Therapy: Patient Spontanous Breathing and Patient connected to nasal cannula oxygen  Post-op Assessment: Report given to RN and Post -op Vital signs reviewed and stable  Post vital signs: Reviewed and stable  Last Vitals:  Vitals:   01/20/17 0849  BP: 117/84  Pulse: 80  Resp: 16  Temp: 36.4 C    Last Pain:  Vitals:   01/20/17 0849  TempSrc: Tympanic         Complications: No apparent anesthesia complications

## 2017-01-20 NOTE — Anesthesia Post-op Follow-up Note (Cosign Needed)
Anesthesia QCDR form completed.        

## 2017-01-20 NOTE — Anesthesia Preprocedure Evaluation (Signed)
Anesthesia Evaluation  Patient identified by MRN, date of birth, ID band Patient awake    Reviewed: Allergy & Precautions, H&P , NPO status , Patient's Chart, lab work & pertinent test results, reviewed documented beta blocker date and time   History of Anesthesia Complications Negative for: history of anesthetic complications  Airway Mallampati: I  TM Distance: >3 FB Neck ROM: full    Dental  (+) Caps   Pulmonary neg pulmonary ROS,           Cardiovascular Exercise Tolerance: Good negative cardio ROS       Neuro/Psych negative neurological ROS  negative psych ROS   GI/Hepatic Neg liver ROS, GERD  ,  Endo/Other  neg diabetesHypothyroidism   Renal/GU negative Renal ROS  negative genitourinary   Musculoskeletal   Abdominal   Peds  Hematology negative hematology ROS (+)   Anesthesia Other Findings Past Medical History: No date: GERD (gastroesophageal reflux disease) No date: Hypothyroidism   Reproductive/Obstetrics negative OB ROS                             Anesthesia Physical Anesthesia Plan  ASA: II  Anesthesia Plan: General   Post-op Pain Management:    Induction:   Airway Management Planned:   Additional Equipment:   Intra-op Plan:   Post-operative Plan:   Informed Consent: I have reviewed the patients History and Physical, chart, labs and discussed the procedure including the risks, benefits and alternatives for the proposed anesthesia with the patient or authorized representative who has indicated his/her understanding and acceptance.   Dental Advisory Given  Plan Discussed with: Anesthesiologist, CRNA and Surgeon  Anesthesia Plan Comments:         Anesthesia Quick Evaluation

## 2017-01-20 NOTE — Op Note (Signed)
[facility] Patient Name: [name] Procedure Date: 01/20/2017 9:40 AM MRN: [id number] Account #: [id number] Date of Birth: 1956-08-18 Admit Type: Outpatient Age: 61 Room: [facility] ENDO ROOM 3 Gender: Male Note Status: Finalized Procedure:            Colonoscopy Indications:          Screening for colorectal malignant neoplasm Providers:            Lollie Sails, MD Referring MD:         Janine Ores. Rosanna Randy, MD (Referring MD) Medicines:            Monitored Anesthesia Care Complications:        No immediate complications. Procedure:            Pre-Anesthesia Assessment:                       - ASA Grade Assessment: II - A patient with mild                        systemic disease.                       After obtaining informed consent, the colonoscope was                        passed under direct vision. Throughout the procedure,                        the patient's blood pressure, pulse, and oxygen                        saturations were monitored continuously. The                        Colonoscope was introduced through the anus and                        advanced to the the cecum, identified by appendiceal                        orifice and ileocecal valve. The colonoscopy was                        performed without difficulty. The patient tolerated the                        procedure well. The quality of the bowel preparation                        was good. Findings:      The colon (entire examined portion) appeared normal.      The retroflexed view of the distal rectum and anal verge was normal and       showed no anal or rectal abnormalities.      The digital rectal exam was normal. Impression:           - The entire examined colon is normal.                       - The distal rectum and anal verge are normal on  retroflexion view.                       - No specimens collected. Recommendation:       -  Discharge patient to home.                       - Repeat colonoscopy in 10 years for screening purposes. Procedure Code(s):    --- Professional ---                       [phone], Colonoscopy, flexible; diagnostic, including                        collection of specimen(s) by brushing or washing, when                        performed (separate procedure) Diagnosis Code(s):    --- Professional ---                       Z12.11, Encounter for screening for malignant neoplasm                        of colon CPT copyright 2016 American Medical Association. All rights reserved. The codes documented in this report are preliminary and upon coder review may  be revised to meet current compliance requirements. Lollie Sails, MD 01/20/2017 9:57:58 AM This report has been signed electronically. Number of Addenda: 0 Note Initiated On: 01/20/2017 9:40 AM Scope Withdrawal Time: 0 hours 5 minutes 54 seconds  Total Procedure Duration: 0 hours 10 minutes 5 seconds       [facility]

## 2017-01-20 NOTE — H&P (Signed)
Outpatient short stay form Pre-procedure 01/20/2017 9:33 AM Lollie Sails MD  Primary Physician: Dr. Miguel Aschoff  Reason for visit:  Colonoscopy  History of present illness:  Patient is a 61 year old male presenting today as above. He tolerated his prep well. He does take 81 mg aspirin but has held that for a number days. He takes no other aspirin or blood thinning agents. His last colonoscopy was probably 10 years ago. Patient states there were no findings on that.    Current Facility-Administered Medications:  .  0.9 %  sodium chloride infusion, , Intravenous, Continuous, Lollie Sails, MD, Last Rate: 20 mL/hr at 01/20/17 0913 .  0.9 %  sodium chloride infusion, , Intravenous, Continuous, Lollie Sails, MD  Prescriptions Prior to Admission  Medication Sig Dispense Refill Last Dose  . levothyroxine (SYNTHROID, LEVOTHROID) 50 MCG tablet take 1 tablet by mouth once daily 30 tablet 12 01/19/2017 at Unknown time  . Multiple Vitamins tablet Take by mouth.   Past Week at Unknown time  . pantoprazole (PROTONIX) 20 MG tablet Take 1 tablet by mouth daily.  0 01/19/2017 at Unknown time  . vitamin C (ASCORBIC ACID) 500 MG tablet Take 500 mg by mouth daily.   Past Week at Unknown time  . aspirin 81 MG tablet Take by mouth.   01/14/2017  . FLUARIX QUADRIVALENT 0.5 ML injection inject 0.5 milliliter intramuscularly  0      Not on File   Past Medical History:  Diagnosis Date  . GERD (gastroesophageal reflux disease)   . Hypothyroidism     Review of systems:      Physical Exam    Heart and lungs: Regular rate and rhythm without rub or gallop, lungs are bilaterally clear    HEENT: Normocephalic atraumatic eyes are anicteric    Other:     Pertinant exam for procedure: Soft nontender nondistended bowel sounds positive normoactive.    Planned proceedures: Colonoscopy and indicated procedures. I have discussed the risks benefits and complications of procedures to include  not limited to bleeding, infection, perforation and the risk of sedation and the patient wishes to proceed.    Lollie Sails, MD Gastroenterology 01/20/2017  9:33 AM

## 2017-01-21 ENCOUNTER — Encounter: Payer: Self-pay | Admitting: Gastroenterology

## 2017-01-27 NOTE — Anesthesia Postprocedure Evaluation (Signed)
Anesthesia Post Note  Patient: [name] [facility]  Procedure(s) Performed: Procedure(s) (LRB): COLONOSCOPY WITH PROPOFOL (N/A)  Patient location during evaluation: Endoscopy Anesthesia Type: General Level of consciousness: awake and alert Pain management: pain level controlled Vital Signs Assessment: post-procedure vital signs reviewed and stable Respiratory status: spontaneous breathing, nonlabored ventilation, respiratory function stable and patient connected to nasal cannula oxygen Cardiovascular status: blood pressure returned to baseline and stable Postop Assessment: no signs of nausea or vomiting Anesthetic complications: no     Last Vitals:  Vitals:   01/20/17 1021 01/20/17 1031  BP: 101/73 110/76  Pulse: 68 69  Resp: 14 16  Temp:      Last Pain:  Vitals:   01/21/17 0717  TempSrc:   PainSc: 0-No pain                 Martha Clan

## 2017-02-23 ENCOUNTER — Other Ambulatory Visit: Payer: Self-pay | Admitting: Family Medicine

## 2017-10-30 ENCOUNTER — Ambulatory Visit (INDEPENDENT_AMBULATORY_CARE_PROVIDER_SITE_OTHER): Payer: BLUE CROSS/BLUE SHIELD | Admitting: Family Medicine

## 2017-10-30 ENCOUNTER — Other Ambulatory Visit: Payer: Self-pay

## 2017-10-30 VITALS — BP 136/80 | HR 72 | Temp 97.5°F | Resp 16 | Wt 213.0 lb

## 2017-10-30 DIAGNOSIS — R200 Anesthesia of skin: Secondary | ICD-10-CM

## 2017-10-30 DIAGNOSIS — Z0000 Encounter for general adult medical examination without abnormal findings: Secondary | ICD-10-CM

## 2017-10-30 DIAGNOSIS — Z125 Encounter for screening for malignant neoplasm of prostate: Secondary | ICD-10-CM

## 2017-10-30 MED ORDER — WRIST SPLINT/COCK-UP/RIGHT L MISC: 1.0000 | Freq: Every day | 0 refills | Status: DC

## 2017-10-30 MED ORDER — WRIST SPLINT/COCK-UP/LEFT L MISC: 1.0000 | Freq: Every day | 0 refills | Status: DC

## 2017-10-30 NOTE — Progress Notes (Signed)
Patient: [name], Male    DOB: Oct 05, 1956, 62 y.o.   MRN: [id number] Visit Date: 10/30/2017  Today's Provider: Wilhemena Durie, MD   Chief Complaint  Patient presents with  . Annual Exam   Subjective:  [name] is a 62 y.o. male who presents today for health maintenance and complete physical. He feels well. He reports exercising 4 times weekly. He reports he is sleeping well.  Immunization History  Administered Date(s) Administered  . Influenza,inj,Quad PF,6+ Mos 08/01/2016  . Influenza,inj,Quad PF,6-35 Mos 05/29/2017  . Influenza-Unspecified 08/07/2015  . Td 10/29/2016  . Tdap 12/01/2006  . Zoster 10/19/2014   01/20/17 Colonoscopy, [place]-Normal, repeat 10 years.  Review of Systems  Constitutional: Negative.   HENT: Negative.   Eyes: Negative.   Respiratory: Negative.   Cardiovascular: Negative.   Gastrointestinal: Negative.   Endocrine: Negative.   Genitourinary: Negative.   Musculoskeletal: Positive for back pain.       Mild chronic low back pain  Skin: Negative.   Allergic/Immunologic: Negative.   Neurological: Negative.   Hematological: Negative.   Psychiatric/Behavioral: Negative.     Social History   Socioeconomic History  . Marital status: Married    Spouse name: Not on file  . Number of children: Not on file  . Years of education: Not on file  . Highest education level: Not on file  Social Needs  . Financial resource strain: Not on file  . Food insecurity - worry: Not on file  . Food insecurity - inability: Not on file  . Transportation needs - medical: Not on file  . Transportation needs - non-medical: Not on file  Occupational History  . Not on file  Tobacco Use  . Smoking status: Never Smoker  . Smokeless tobacco: Never Used  Substance and Sexual Activity  . Alcohol use: Yes    Comment: about 1 glass of wine with dinner  . Drug use: No  . Sexual activity: Not on file  Other Topics Concern  . Not on file  Social History  Narrative  . Not on file    Patient Active Problem List   Diagnosis Date Noted  . 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 Reactive airway disease 04/23/2016  . Venous insufficiency of leg 11/06/2015  . Allergic rhinitis 09/19/2015  . Chronic LBP 09/19/2015  . Acid reflux 09/19/2015  . Adult hypothyroidism 09/19/2015  . Edema 09/19/2015  . Pure hypercholesterolemia 09/19/2015    Past Surgical History:  Procedure Laterality Date  . COLONOSCOPY WITH PROPOFOL N/A 01/20/2017   Procedure: COLONOSCOPY WITH PROPOFOL;  Surgeon: Lollie Sails, MD;  Location: [facility] ENDOSCOPY;  Service: Endoscopy;  [name];  . ESOPHAGOGASTRODUODENOSCOPY (EGD) WITH ESOPHAGEAL DILATION    . WISDOM TOOTH EXTRACTION      His family history includes Heart attack in his father; Heart disease in his father; Hyperlipidemia in his mother; Lung cancer in his father; Macular degeneration in his mother.     Outpatient Encounter Medications as of 10/30/2017  Medication Sig Note  . aspirin 81 MG tablet Take by mouth. 09/19/2015: Received from: [facility] [facility]  . FLUARIX QUADRIVALENT 0.5 ML injection inject 0.5 milliliter intramuscularly   . levothyroxine (SYNTHROID, LEVOTHROID) 50 MCG tablet take 1 tablet by mouth once daily   . Multiple Vitamins tablet Take by mouth. 09/19/2015: Received from: [employer] [facility]  . pantoprazole (PROTONIX) 20 MG tablet Take 1 tablet by mouth daily.   . vitamin C (ASCORBIC ACID) 500 MG tablet Take 500 mg  by mouth daily.    No facility-administered encounter medications on file as of 10/30/2017.     Patient Care Team: Jerrol Banana., MD as PCP - General (Family Medicine)      Objective:   Vitals:  Vitals:   10/30/17 0901  BP: 136/80  Pulse: 72  Resp: 16  Temp: (!) 97.5 F (36.4 C)  TempSrc: Oral  Weight: 213 lb (96.6 kg)    Physical Exam  Constitutional: He is oriented to person, place, and time. He appears well-developed and  well-nourished.  HENT:  Head: Normocephalic and atraumatic.  Right Ear: External ear normal.  Left Ear: External ear normal.  Nose: Nose normal.  Mouth/Throat: Oropharynx is clear and moist.  Eyes: Conjunctivae and EOM are normal. Pupils are equal, round, and reactive to light.  Neck: Normal range of motion. Neck supple.  Cardiovascular: Normal rate, regular rhythm, normal heart sounds and intact distal pulses.  Pulmonary/Chest: Effort normal and breath sounds normal.  Abdominal: Soft. Bowel sounds are normal.  Genitourinary: Rectum normal, prostate normal and penis normal.  Musculoskeletal: Normal range of motion.  Bilateral mild Dupeytrans contracture hands.  Neurological: He is alert and oriented to person, place, and time.  Skin: Skin is warm and dry.  Psychiatric: He has a normal mood and affect. His behavior is normal. Judgment and thought content normal.    Fall Risk  10/30/2017  Falls in the past year? No   Depression Screen PHQ 2/9 Scores 10/30/2017  PHQ - 2 Score 0   Current Exercise Habits: Home exercise routine, Type of exercise: walking, Frequency (Times/Week): 4   Functional Status Survey: Is the patient deaf or have difficulty hearing?: No Does the patient have difficulty seeing, even when wearing glasses/contacts?: No Does the patient have difficulty concentrating, remembering, or making decisions?: No Does the patient have difficulty walking or climbing stairs?: No Does the patient have difficulty dressing or bathing?: No Does the patient have difficulty doing errands alone such as visiting a doctor's office or shopping?: No    Assessment & Plan:     Routine Health Maintenance and Physical Exam  Exercise Activities and Dietary recommendations Goals    None      Immunization History  Administered Date(s) Administered  . Influenza,inj,Quad PF,6+ Mos 08/01/2016  . Influenza,inj,Quad PF,6-35 Mos 05/29/2017  . Influenza-Unspecified 08/07/2015  . Td  10/29/2016  . Tdap 12/01/2006  . Zoster 10/19/2014    Health Maintenance  Topic Date Due  . HIV Screening  11/25/1968  . TETANUS/TDAP  10/29/2026  . COLONOSCOPY  01/21/2027  . INFLUENZA VACCINE  Completed  . Hepatitis C Screening  Completed     Discussed health benefits of physical activity, and encouraged him to engage in regular exercise appropriate for his age and condition.  Mild CTS Try cock up wrist splints.    I have done the exam and reviewed the chart and it is accurate to the best of my knowledge. Development worker, community has been used and  any errors in dictation or transcription are unintentional. Miguel Aschoff M.D. [facility]

## 2017-10-31 LAB — CBC WITH DIFFERENTIAL/PLATELET
BASOS ABS: 0 10*3/uL (ref 0.0–0.2)
Basos: 1 %
EOS (ABSOLUTE): 0.1 10*3/uL (ref 0.0–0.4)
Eos: 2 %
HEMOGLOBIN: 14.9 g/dL (ref 13.0–17.7)
Hematocrit: 43.7 % (ref 37.5–51.0)
Immature Grans (Abs): 0 10*3/uL (ref 0.0–0.1)
Immature Granulocytes: 0 %
LYMPHS ABS: 2.1 10*3/uL (ref 0.7–3.1)
Lymphs: 43 %
MCH: 31 pg (ref 26.6–33.0)
MCHC: 34.1 g/dL (ref 31.5–35.7)
MCV: 91 fL (ref 79–97)
MONOCYTES: 7 %
Monocytes Absolute: 0.4 10*3/uL (ref 0.1–0.9)
NEUTROS PCT: 47 %
Neutrophils Absolute: 2.3 10*3/uL (ref 1.4–7.0)
Platelets: 236 10*3/uL (ref 150–379)
RBC: 4.8 x10E6/uL (ref 4.14–5.80)
RDW: 13.6 % (ref 12.3–15.4)
WBC: 5 10*3/uL (ref 3.4–10.8)

## 2017-10-31 LAB — PSA: Prostate Specific Ag, Serum: 1.4 ng/mL (ref 0.0–4.0)

## 2017-10-31 LAB — COMPREHENSIVE METABOLIC PANEL
ALBUMIN: 4.2 g/dL (ref 3.6–4.8)
ALK PHOS: 58 IU/L (ref 39–117)
ALT: 23 IU/L (ref 0–44)
AST: 23 IU/L (ref 0–40)
Albumin/Globulin Ratio: 2.1 (ref 1.2–2.2)
BUN/Creatinine Ratio: 14 (ref 10–24)
BUN: 17 mg/dL (ref 8–27)
Bilirubin Total: 0.4 mg/dL (ref 0.0–1.2)
CO2: 25 mmol/L (ref 20–29)
CREATININE: 1.22 mg/dL (ref 0.76–1.27)
Calcium: 9.6 mg/dL (ref 8.6–10.2)
Chloride: 102 mmol/L (ref 96–106)
GFR calc Af Amer: 72 mL/min/{1.73_m2} (ref >59)
GFR calc non Af Amer: 63 mL/min/{1.73_m2} (ref >59)
GLUCOSE: 84 mg/dL (ref 65–99)
Globulin, Total: 2 g/dL (ref 1.5–4.5)
Potassium: 4.5 mmol/L (ref 3.5–5.2)
Sodium: 142 mmol/L (ref 134–144)
Total Protein: 6.2 g/dL (ref 6.0–8.5)

## 2017-10-31 LAB — LIPID PANEL WITH LDL/HDL RATIO
CHOLESTEROL TOTAL: 192 mg/dL (ref 100–199)
HDL: 58 mg/dL (ref >39)
LDL CALC: 121 mg/dL — AB (ref 0–99)
LDl/HDL Ratio: 2.1 ratio (ref 0.0–3.6)
Triglycerides: 66 mg/dL (ref 0–149)
VLDL CHOLESTEROL CAL: 13 mg/dL (ref 5–40)

## 2017-10-31 LAB — TSH: TSH: 2.75 u[IU]/mL (ref 0.450–4.500)

## 2018-03-23 ENCOUNTER — Other Ambulatory Visit: Payer: Self-pay | Admitting: Family Medicine

## 2018-09-01 ENCOUNTER — Other Ambulatory Visit: Payer: Self-pay | Admitting: *Deleted

## 2018-09-01 MED ORDER — LEVOTHYROXINE SODIUM 50 MCG PO TABS: 50.0000 ug | ORAL_TABLET | Freq: Every day | ORAL | 1 refills | Status: DC

## 2018-09-01 NOTE — Telephone Encounter (Signed)
Patient called office requesting levothyroxine rx be changed to 90 day supply.

## 2018-11-17 DIAGNOSIS — M5033 Other cervical disc degeneration, cervicothoracic region: Secondary | ICD-10-CM | POA: Diagnosis not present

## 2018-11-17 DIAGNOSIS — M9901 Segmental and somatic dysfunction of cervical region: Secondary | ICD-10-CM | POA: Diagnosis not present

## 2018-11-17 DIAGNOSIS — M62830 Muscle spasm of back: Secondary | ICD-10-CM | POA: Diagnosis not present

## 2018-11-17 DIAGNOSIS — M9903 Segmental and somatic dysfunction of lumbar region: Secondary | ICD-10-CM | POA: Diagnosis not present

## 2018-12-16 DIAGNOSIS — M9901 Segmental and somatic dysfunction of cervical region: Secondary | ICD-10-CM | POA: Diagnosis not present

## 2018-12-16 DIAGNOSIS — M62830 Muscle spasm of back: Secondary | ICD-10-CM | POA: Diagnosis not present

## 2018-12-16 DIAGNOSIS — M5033 Other cervical disc degeneration, cervicothoracic region: Secondary | ICD-10-CM | POA: Diagnosis not present

## 2018-12-16 DIAGNOSIS — M9903 Segmental and somatic dysfunction of lumbar region: Secondary | ICD-10-CM | POA: Diagnosis not present

## 2019-01-04 ENCOUNTER — Encounter: Payer: BLUE CROSS/BLUE SHIELD | Admitting: Family Medicine

## 2019-01-13 DIAGNOSIS — M62830 Muscle spasm of back: Secondary | ICD-10-CM | POA: Diagnosis not present

## 2019-01-13 DIAGNOSIS — M9901 Segmental and somatic dysfunction of cervical region: Secondary | ICD-10-CM | POA: Diagnosis not present

## 2019-01-13 DIAGNOSIS — M9903 Segmental and somatic dysfunction of lumbar region: Secondary | ICD-10-CM | POA: Diagnosis not present

## 2019-01-13 DIAGNOSIS — M5033 Other cervical disc degeneration, cervicothoracic region: Secondary | ICD-10-CM | POA: Diagnosis not present

## 2019-02-10 DIAGNOSIS — M62830 Muscle spasm of back: Secondary | ICD-10-CM | POA: Diagnosis not present

## 2019-02-10 DIAGNOSIS — M9903 Segmental and somatic dysfunction of lumbar region: Secondary | ICD-10-CM | POA: Diagnosis not present

## 2019-02-10 DIAGNOSIS — M9901 Segmental and somatic dysfunction of cervical region: Secondary | ICD-10-CM | POA: Diagnosis not present

## 2019-02-10 DIAGNOSIS — M5033 Other cervical disc degeneration, cervicothoracic region: Secondary | ICD-10-CM | POA: Diagnosis not present

## 2019-03-10 DIAGNOSIS — M62830 Muscle spasm of back: Secondary | ICD-10-CM | POA: Diagnosis not present

## 2019-03-10 DIAGNOSIS — M9903 Segmental and somatic dysfunction of lumbar region: Secondary | ICD-10-CM | POA: Diagnosis not present

## 2019-03-10 DIAGNOSIS — M9901 Segmental and somatic dysfunction of cervical region: Secondary | ICD-10-CM | POA: Diagnosis not present

## 2019-03-10 DIAGNOSIS — M5033 Other cervical disc degeneration, cervicothoracic region: Secondary | ICD-10-CM | POA: Diagnosis not present

## 2019-03-12 ENCOUNTER — Other Ambulatory Visit: Payer: Self-pay | Admitting: Family Medicine

## 2019-03-12 NOTE — Telephone Encounter (Signed)
Pharmacy requesting refills. Thanks!  

## 2019-03-23 DIAGNOSIS — R69 Illness, unspecified: Secondary | ICD-10-CM | POA: Diagnosis not present

## 2019-04-14 DIAGNOSIS — M9901 Segmental and somatic dysfunction of cervical region: Secondary | ICD-10-CM | POA: Diagnosis not present

## 2019-04-14 DIAGNOSIS — M9903 Segmental and somatic dysfunction of lumbar region: Secondary | ICD-10-CM | POA: Diagnosis not present

## 2019-04-14 DIAGNOSIS — M5033 Other cervical disc degeneration, cervicothoracic region: Secondary | ICD-10-CM | POA: Diagnosis not present

## 2019-04-14 DIAGNOSIS — M62830 Muscle spasm of back: Secondary | ICD-10-CM | POA: Diagnosis not present

## 2019-05-10 ENCOUNTER — Encounter: Payer: Self-pay | Admitting: Family Medicine

## 2019-05-10 ENCOUNTER — Ambulatory Visit (INDEPENDENT_AMBULATORY_CARE_PROVIDER_SITE_OTHER): Payer: Medicare HMO | Admitting: Family Medicine

## 2019-05-10 ENCOUNTER — Other Ambulatory Visit: Payer: Self-pay

## 2019-05-10 VITALS — BP 118/76 | HR 60 | Temp 98.8°F | Resp 16 | Ht 73.0 in | Wt 194.0 lb

## 2019-05-10 DIAGNOSIS — Z23 Encounter for immunization: Secondary | ICD-10-CM

## 2019-05-10 DIAGNOSIS — Z0000 Encounter for general adult medical examination without abnormal findings: Secondary | ICD-10-CM | POA: Diagnosis not present

## 2019-05-10 NOTE — Progress Notes (Signed)
Patient: [name], Male    DOB: 11-10-1955, 63 y.o.   MRN: [id number] Visit Date: 05/10/2019  Today's Provider: Wilhemena Durie, MD   Chief Complaint  Patient presents with  . annual wellness exam   Subjective:     Annual physical visit [name] is a 63 y.o. male. He feels well. He reports exercising daily. He reports he is sleeping well. Pt is married and has been retired for 2 years.   Review of Systems  Constitutional: Negative.   HENT: Negative.   Eyes: Negative.   Respiratory: Negative.   Cardiovascular: Negative.   Gastrointestinal: Negative.   Endocrine: Negative.   Genitourinary: Negative.   Musculoskeletal: Negative.   Skin: Negative.   Allergic/Immunologic: Negative.   Neurological: Negative.   Hematological: Negative.   Psychiatric/Behavioral: Negative.     Social History   Socioeconomic History  . Marital status: Married    Spouse name: Not on file  . Number of children: Not on file  . Years of education: Not on file  . Highest education level: Not on file  Occupational History  . Not on file  Social Needs  . Financial resource strain: Not on file  . Food insecurity    Worry: Not on file    Inability: Not on file  . Transportation needs    Medical: Not on file    Non-medical: Not on file  Tobacco Use  . Smoking status: Never Smoker  . Smokeless tobacco: Never Used  Substance and Sexual Activity  . Alcohol use: Yes    Comment: about 1 glass of wine with dinner  . Drug use: No  . Sexual activity: Not on file  Lifestyle  . Physical activity    Days per week: Not on file    Minutes per session: Not on file  . Stress: Not on file  Relationships  . Social Herbalist on phone: Not on file    Gets together: Not on file    Attends religious service: Not on file    Active member of club or organization: Not on file    Attends meetings of clubs or organizations: Not on file    Relationship status: Not on  file  . Intimate partner violence    Fear of current or ex partner: Not on file    Emotionally abused: Not on file    Physically abused: Not on file    Forced sexual activity: Not on file  Other Topics Concern  . Not on file  Social History Narrative  . Not on file    Past Medical History:  Diagnosis Date  . GERD (gastroesophageal reflux disease)   . Hx of basal cell carcinoma    multiple sites  . Hx of dysplastic nevus    multiple sites  . 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 Hypothyroidism      Patient Active Problem List   Diagnosis Date Noted  . 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 Reactive airway disease 04/23/2016  . Venous insufficiency of leg 11/06/2015  . Allergic rhinitis 09/19/2015  . Chronic LBP 09/19/2015  . Acid reflux 09/19/2015  . Adult hypothyroidism 09/19/2015  . Edema 09/19/2015  . Pure hypercholesterolemia 09/19/2015    Past Surgical History:  Procedure Laterality Date  . COLONOSCOPY WITH PROPOFOL N/A 01/20/2017   Procedure: COLONOSCOPY WITH PROPOFOL;  Surgeon: Lollie Sails,  MD;  Location: [facility] ENDOSCOPY;  Service: Endoscopy;  [name];  . ESOPHAGOGASTRODUODENOSCOPY (EGD) WITH ESOPHAGEAL DILATION    . MELANOMA EXCISION Right 11/24/2013   lateral infraclavicular  . WISDOM TOOTH EXTRACTION      His family history includes Heart attack in his father; Heart disease in his father; Hyperlipidemia in his mother; Lung cancer in his father; Macular degeneration in his mother.   Current Outpatient Medications:  .  aspirin 81 MG tablet, Take by mouth., Disp: , Rfl:  .  levothyroxine (SYNTHROID) 50 MCG tablet, TAKE 1 TABLET(50 MCG) BY MOUTH DAILY, Disp: 90 tablet, Rfl: 1 .  Multiple Vitamins tablet, Take by mouth., Disp: , Rfl:  .  pantoprazole (PROTONIX) 20 MG tablet, Take 1 tablet by mouth daily., Disp: , Rfl: 0 .  vitamin C (ASCORBIC ACID) 500 MG tablet, Take  500 mg by mouth daily., Disp: , Rfl:  .  Elastic Bandages & Supports (WRIST SPLINT/COCK-UP/LEFT L) MISC, 1 each by Does not apply route daily. (Patient not taking: Reported on 05/10/2019), Disp: 1 each, Rfl: 0 .  Elastic Bandages & Supports (WRIST SPLINT/COCK-UP/RIGHT L) MISC, 1 each by Does not apply route daily. (Patient not taking: Reported on 05/10/2019), Disp: 1 each, Rfl: 0 .  FLUARIX QUADRIVALENT 0.5 ML injection, inject 0.5 milliliter intramuscularly, Disp: , Rfl: 0  Patient Care Team: Jerrol Banana., MD as PCP - General (Family Medicine)    Objective:    Vitals: BP 118/76   Pulse 60   Temp 98.8 F (37.1 C)   Resp 16   Wt 194 lb (88 kg)   SpO2 98%   BMI 25.60 kg/m   Physical Exam Vitals signs reviewed.  Constitutional:      Appearance: He is well-developed.  HENT:     Head: Normocephalic and atraumatic.     Right Ear: Ear canal and external ear normal.     Left Ear: Ear canal and external ear normal.     Ears:     Comments: Left EAC blocked.  Cleared by irrigation.    Nose: Nose normal.  Eyes:     Conjunctiva/sclera: Conjunctivae normal.     Pupils: Pupils are equal, round, and reactive to light.  Neck:     Musculoskeletal: Normal range of motion and neck supple.  Cardiovascular:     Rate and Rhythm: Normal rate and regular rhythm.     Heart sounds: Normal heart sounds.  Pulmonary:     Effort: Pulmonary effort is normal.     Breath sounds: Normal breath sounds.  Abdominal:     General: Bowel sounds are normal.     Palpations: Abdomen is soft.  Genitourinary:    Penis: Normal.      Prostate: Normal.     Rectum: Normal.  Musculoskeletal: Normal range of motion.     Comments: Bilateral mild Dupeytrans contracture hands.  Skin:    General: Skin is warm and dry.  Neurological:     Mental Status: He is alert and oriented to person, place, and time.  Psychiatric:        Behavior: Behavior normal.        Thought Content: Thought content normal.         Judgment: Judgment normal.     Activities of Daily Living  No flowsheet data found.  Fall Risk Assessment Fall Risk  10/30/2017  Falls in the past year? No     Depression Screen PHQ 2/9 Scores 10/30/2017  PHQ - 2 Score  0    No flowsheet data found.    Assessment & Plan:     Annual Wellness Visit  Reviewed patient's Family Medical History Reviewed and updated list of patient's medical providers Assessment of cognitive impairment was done Assessed patient's functional ability Established a written schedule for health screening [place] Completed and Reviewed  Exercise Activities and Dietary recommendations Goals   None     Immunization History  Administered Date(s) Administered  . Influenza,inj,Quad PF,6+ Mos 08/01/2016  . Influenza,inj,Quad PF,6-35 Mos 05/29/2017  . Influenza-Unspecified 08/07/2015  . Td 10/29/2016  . Tdap 12/01/2006  . Zoster 10/19/2014    Health Maintenance  Topic Date Due  . HIV Screening  11/25/1968  . PNA vac Low Risk Adult (1 of 2 - PCV13) 11/25/2018  . INFLUENZA VACCINE  05/08/2019  . TETANUS/TDAP  10/29/2026  . COLONOSCOPY  01/21/2027  . Hepatitis C Screening  Completed    Pneumovax given to 63 yo. Discussed health benefits of physical activity, and encouraged him to engage in regular exercise appropriate for his age and condition.     Berkley Wrightsman Cranford Mon, MD  [facility]

## 2019-05-12 DIAGNOSIS — M5033 Other cervical disc degeneration, cervicothoracic region: Secondary | ICD-10-CM | POA: Diagnosis not present

## 2019-05-12 DIAGNOSIS — M9901 Segmental and somatic dysfunction of cervical region: Secondary | ICD-10-CM | POA: Diagnosis not present

## 2019-05-12 DIAGNOSIS — M62830 Muscle spasm of back: Secondary | ICD-10-CM | POA: Diagnosis not present

## 2019-05-12 DIAGNOSIS — M9903 Segmental and somatic dysfunction of lumbar region: Secondary | ICD-10-CM | POA: Diagnosis not present

## 2019-05-26 DIAGNOSIS — Z809 Family history of malignant neoplasm, unspecified: Secondary | ICD-10-CM | POA: Diagnosis not present

## 2019-05-26 DIAGNOSIS — K219 Gastro-esophageal reflux disease without esophagitis: Secondary | ICD-10-CM | POA: Diagnosis not present

## 2019-05-26 DIAGNOSIS — G8929 Other chronic pain: Secondary | ICD-10-CM | POA: Diagnosis not present

## 2019-05-26 DIAGNOSIS — M1990 Unspecified osteoarthritis, unspecified site: Secondary | ICD-10-CM | POA: Diagnosis not present

## 2019-05-26 DIAGNOSIS — Z7982 Long term (current) use of aspirin: Secondary | ICD-10-CM | POA: Diagnosis not present

## 2019-05-26 DIAGNOSIS — E039 Hypothyroidism, unspecified: Secondary | ICD-10-CM | POA: Diagnosis not present

## 2019-05-26 DIAGNOSIS — Z8249 Family history of ischemic heart disease and other diseases of the circulatory system: Secondary | ICD-10-CM | POA: Diagnosis not present

## 2019-05-26 DIAGNOSIS — Z791 Long term (current) use of non-steroidal anti-inflammatories (NSAID): Secondary | ICD-10-CM | POA: Diagnosis not present

## 2019-06-08 ENCOUNTER — Ambulatory Visit (INDEPENDENT_AMBULATORY_CARE_PROVIDER_SITE_OTHER): Payer: Medicare HMO | Admitting: Family Medicine

## 2019-06-08 ENCOUNTER — Other Ambulatory Visit: Payer: Self-pay

## 2019-06-08 ENCOUNTER — Encounter: Payer: Self-pay | Admitting: Family Medicine

## 2019-06-08 VITALS — BP 124/74 | HR 58 | Temp 98.5°F | Resp 16 | Ht 73.0 in | Wt 195.0 lb

## 2019-06-08 DIAGNOSIS — E039 Hypothyroidism, unspecified: Secondary | ICD-10-CM | POA: Diagnosis not present

## 2019-06-08 DIAGNOSIS — J309 Allergic rhinitis, unspecified: Secondary | ICD-10-CM

## 2019-06-08 DIAGNOSIS — Z23 Encounter for immunization: Secondary | ICD-10-CM | POA: Diagnosis not present

## 2019-06-08 DIAGNOSIS — E7800 Pure hypercholesterolemia, unspecified: Secondary | ICD-10-CM | POA: Diagnosis not present

## 2019-06-08 NOTE — Progress Notes (Signed)
Patient: [name] Male    DOB: 1956/02/28   63 y.o.   MRN: [id number] Visit Date: 06/08/2019  Today's Provider: Wilhemena Durie, MD   Chief Complaint  Patient presents with  . needs labs   Subjective:   HPI Patient comes in today for repeat labs. He was last seen in the office 1 month ago for his wellness exam. He feels well today with no complaints. Last labs were checked on 10/2017 and were WNL.   No Known Allergies   Current Outpatient Medications:  .  aspirin 81 MG tablet, Take by mouth., Disp: , Rfl:  .  levothyroxine (SYNTHROID) 50 MCG tablet, TAKE 1 TABLET(50 MCG) BY MOUTH DAILY, Disp: 90 tablet, Rfl: 1 .  Multiple Vitamins tablet, Take by mouth., Disp: , Rfl:  .  pantoprazole (PROTONIX) 20 MG tablet, Take 1 tablet by mouth daily., Disp: , Rfl: 0 .  vitamin C (ASCORBIC ACID) 500 MG tablet, Take 500 mg by mouth daily., Disp: , Rfl:  .  Elastic Bandages & Supports (WRIST SPLINT/COCK-UP/LEFT L) MISC, 1 each by Does not apply route daily. (Patient not taking: Reported on 05/10/2019), Disp: 1 each, Rfl: 0 .  Elastic Bandages & Supports (WRIST SPLINT/COCK-UP/RIGHT L) MISC, 1 each by Does not apply route daily. (Patient not taking: Reported on 05/10/2019), Disp: 1 each, Rfl: 0 .  FLUARIX QUADRIVALENT 0.5 ML injection, inject 0.5 milliliter intramuscularly, Disp: , Rfl: 0  Review of Systems  Constitutional: Negative for activity change and fatigue.  Respiratory: Negative for cough and shortness of breath.   Cardiovascular: Negative for chest pain, palpitations and leg swelling.  Musculoskeletal: Negative for arthralgias.  Neurological: Negative for dizziness, light-headedness and headaches.  Psychiatric/Behavioral: Negative for agitation, self-injury, sleep disturbance and suicidal ideas. The patient is not nervous/anxious.     Social History   Tobacco Use  . Smoking status: Never Smoker  . Smokeless tobacco: Never Used  Substance Use Topics  . Alcohol use: Yes     Comment: about 1 glass of wine with dinner      Objective:   BP 124/74   Pulse (!) 58   Temp 98.5 F (36.9 C)   Resp 16   Ht 6\' 1"  (1.854 m)   Wt 195 lb (88.5 kg)   SpO2 98%   BMI 25.73 kg/m  Vitals:   06/08/19 0835  BP: 124/74  Pulse: (!) 58  Resp: 16  Temp: 98.5 F (36.9 C)  SpO2: 98%  Weight: 195 lb (88.5 kg)  Height: 6\' 1"  (1.854 m)  Body mass index is 25.73 kg/m.   Physical Exam Vitals signs reviewed.  Constitutional:      Appearance: Normal appearance.  Cardiovascular:     Rate and Rhythm: Regular rhythm.     Heart sounds: Normal heart sounds.  Neurological:     Mental Status: He is alert.  Psychiatric:        Mood and Affect: Mood normal.        Behavior: Behavior normal.        Thought Content: Thought content normal.        Judgment: Judgment normal.      No results found for any visits on 06/08/19.     Assessment & Plan    1. Adult hypothyroidism  - TSH  2. Pure hypercholesterolemia  - Comprehensive metabolic panel - Lipid panel  3. Allergic rhinitis, unspecified seasonality, unspecified trigger  - CBC with Differential/Platelet  4. Flu  vaccine need  - Flu Vaccine QUAD High Dose(Fluad)     Wilhemena Durie, MD  [facility]

## 2019-06-09 ENCOUNTER — Telehealth: Payer: Self-pay

## 2019-06-09 DIAGNOSIS — M9903 Segmental and somatic dysfunction of lumbar region: Secondary | ICD-10-CM | POA: Diagnosis not present

## 2019-06-09 DIAGNOSIS — M9901 Segmental and somatic dysfunction of cervical region: Secondary | ICD-10-CM | POA: Diagnosis not present

## 2019-06-09 DIAGNOSIS — M62830 Muscle spasm of back: Secondary | ICD-10-CM | POA: Diagnosis not present

## 2019-06-09 DIAGNOSIS — M5033 Other cervical disc degeneration, cervicothoracic region: Secondary | ICD-10-CM | POA: Diagnosis not present

## 2019-06-09 LAB — LIPID PANEL
Chol/HDL Ratio: 2.9 ratio (ref 0.0–5.0)
Cholesterol, Total: 217 mg/dL — ABNORMAL HIGH (ref 100–199)
HDL: 74 mg/dL (ref >39)
LDL Chol Calc (NIH): 130 mg/dL — ABNORMAL HIGH (ref 0–99)
Triglycerides: 72 mg/dL (ref 0–149)
VLDL Cholesterol Cal: 13 mg/dL (ref 5–40)

## 2019-06-09 LAB — CBC WITH DIFFERENTIAL/PLATELET
Basophils Absolute: 0.1 10*3/uL (ref 0.0–0.2)
Basos: 1 %
EOS (ABSOLUTE): 0.1 10*3/uL (ref 0.0–0.4)
Eos: 2 %
Hematocrit: 45.6 % (ref 37.5–51.0)
Hemoglobin: 15.2 g/dL (ref 13.0–17.7)
Immature Grans (Abs): 0 10*3/uL (ref 0.0–0.1)
Immature Granulocytes: 1 %
Lymphocytes Absolute: 2.3 10*3/uL (ref 0.7–3.1)
Lymphs: 40 %
MCH: 31.5 pg (ref 26.6–33.0)
MCHC: 33.3 g/dL (ref 31.5–35.7)
MCV: 95 fL (ref 79–97)
Monocytes Absolute: 0.5 10*3/uL (ref 0.1–0.9)
Monocytes: 8 %
Neutrophils Absolute: 2.8 10*3/uL (ref 1.4–7.0)
Neutrophils: 48 %
Platelets: 228 10*3/uL (ref 150–450)
RBC: 4.82 x10E6/uL (ref 4.14–5.80)
RDW: 13.1 % (ref 11.6–15.4)
WBC: 5.9 10*3/uL (ref 3.4–10.8)

## 2019-06-09 LAB — COMPREHENSIVE METABOLIC PANEL
ALT: 15 IU/L (ref 0–44)
AST: 18 IU/L (ref 0–40)
Albumin/Globulin Ratio: 2.3 — ABNORMAL HIGH (ref 1.2–2.2)
Albumin: 4.4 g/dL (ref 3.8–4.8)
Alkaline Phosphatase: 49 IU/L (ref 39–117)
BUN/Creatinine Ratio: 12 (ref 10–24)
BUN: 14 mg/dL (ref 8–27)
Bilirubin Total: 0.5 mg/dL (ref 0.0–1.2)
CO2: 23 mmol/L (ref 20–29)
Calcium: 9.6 mg/dL (ref 8.6–10.2)
Chloride: 104 mmol/L (ref 96–106)
Creatinine, Ser: 1.21 mg/dL (ref 0.76–1.27)
GFR calc Af Amer: 72 mL/min/{1.73_m2} (ref >59)
GFR calc non Af Amer: 62 mL/min/{1.73_m2} (ref >59)
Globulin, Total: 1.9 g/dL (ref 1.5–4.5)
Glucose: 90 mg/dL (ref 65–99)
Potassium: 4.3 mmol/L (ref 3.5–5.2)
Sodium: 142 mmol/L (ref 134–144)
Total Protein: 6.3 g/dL (ref 6.0–8.5)

## 2019-06-09 LAB — TSH: TSH: 3.36 u[IU]/mL (ref 0.450–4.500)

## 2019-06-09 NOTE — Telephone Encounter (Signed)
-----   Message from Jerrol Banana., MD sent at 06/09/2019  1:22 PM EDT ----- Stable.

## 2019-06-09 NOTE — Telephone Encounter (Signed)
Patient notified of lab results

## 2019-07-07 DIAGNOSIS — M5033 Other cervical disc degeneration, cervicothoracic region: Secondary | ICD-10-CM | POA: Diagnosis not present

## 2019-07-07 DIAGNOSIS — M9903 Segmental and somatic dysfunction of lumbar region: Secondary | ICD-10-CM | POA: Diagnosis not present

## 2019-07-07 DIAGNOSIS — M62830 Muscle spasm of back: Secondary | ICD-10-CM | POA: Diagnosis not present

## 2019-07-07 DIAGNOSIS — M9901 Segmental and somatic dysfunction of cervical region: Secondary | ICD-10-CM | POA: Diagnosis not present

## 2019-08-04 DIAGNOSIS — M9901 Segmental and somatic dysfunction of cervical region: Secondary | ICD-10-CM | POA: Diagnosis not present

## 2019-08-04 DIAGNOSIS — M5033 Other cervical disc degeneration, cervicothoracic region: Secondary | ICD-10-CM | POA: Diagnosis not present

## 2019-08-04 DIAGNOSIS — M9903 Segmental and somatic dysfunction of lumbar region: Secondary | ICD-10-CM | POA: Diagnosis not present

## 2019-08-04 DIAGNOSIS — M62830 Muscle spasm of back: Secondary | ICD-10-CM | POA: Diagnosis not present

## 2019-09-08 DIAGNOSIS — M5033 Other cervical disc degeneration, cervicothoracic region: Secondary | ICD-10-CM | POA: Diagnosis not present

## 2019-09-08 DIAGNOSIS — M9901 Segmental and somatic dysfunction of cervical region: Secondary | ICD-10-CM | POA: Diagnosis not present

## 2019-09-08 DIAGNOSIS — M62830 Muscle spasm of back: Secondary | ICD-10-CM | POA: Diagnosis not present

## 2019-09-08 DIAGNOSIS — M9903 Segmental and somatic dysfunction of lumbar region: Secondary | ICD-10-CM | POA: Diagnosis not present

## 2019-09-13 ENCOUNTER — Other Ambulatory Visit: Payer: Self-pay | Admitting: Family Medicine

## 2019-09-20 DIAGNOSIS — Z85820 Personal history of malignant melanoma of skin: Secondary | ICD-10-CM | POA: Diagnosis not present

## 2019-09-20 DIAGNOSIS — L814 Other melanin hyperpigmentation: Secondary | ICD-10-CM | POA: Diagnosis not present

## 2019-09-20 DIAGNOSIS — L578 Other skin changes due to chronic exposure to nonionizing radiation: Secondary | ICD-10-CM | POA: Diagnosis not present

## 2019-09-20 DIAGNOSIS — L570 Actinic keratosis: Secondary | ICD-10-CM | POA: Diagnosis not present

## 2019-09-20 DIAGNOSIS — D1801 Hemangioma of skin and subcutaneous tissue: Secondary | ICD-10-CM | POA: Diagnosis not present

## 2019-09-20 DIAGNOSIS — Z1283 Encounter for screening for malignant neoplasm of skin: Secondary | ICD-10-CM | POA: Diagnosis not present

## 2019-09-20 DIAGNOSIS — L821 Other seborrheic keratosis: Secondary | ICD-10-CM | POA: Diagnosis not present

## 2019-09-20 DIAGNOSIS — Z86018 Personal history of other benign neoplasm: Secondary | ICD-10-CM | POA: Diagnosis not present

## 2019-09-20 DIAGNOSIS — Z85828 Personal history of other malignant neoplasm of skin: Secondary | ICD-10-CM | POA: Diagnosis not present

## 2019-09-20 DIAGNOSIS — D225 Melanocytic nevi of trunk: Secondary | ICD-10-CM | POA: Diagnosis not present

## 2019-10-13 DIAGNOSIS — M9901 Segmental and somatic dysfunction of cervical region: Secondary | ICD-10-CM | POA: Diagnosis not present

## 2019-10-13 DIAGNOSIS — M9903 Segmental and somatic dysfunction of lumbar region: Secondary | ICD-10-CM | POA: Diagnosis not present

## 2019-10-13 DIAGNOSIS — M62830 Muscle spasm of back: Secondary | ICD-10-CM | POA: Diagnosis not present

## 2019-10-13 DIAGNOSIS — M5033 Other cervical disc degeneration, cervicothoracic region: Secondary | ICD-10-CM | POA: Diagnosis not present

## 2019-11-10 DIAGNOSIS — M9901 Segmental and somatic dysfunction of cervical region: Secondary | ICD-10-CM | POA: Diagnosis not present

## 2019-11-10 DIAGNOSIS — M9903 Segmental and somatic dysfunction of lumbar region: Secondary | ICD-10-CM | POA: Diagnosis not present

## 2019-11-10 DIAGNOSIS — M62830 Muscle spasm of back: Secondary | ICD-10-CM | POA: Diagnosis not present

## 2019-11-10 DIAGNOSIS — M5033 Other cervical disc degeneration, cervicothoracic region: Secondary | ICD-10-CM | POA: Diagnosis not present

## 2019-12-08 DIAGNOSIS — M9901 Segmental and somatic dysfunction of cervical region: Secondary | ICD-10-CM | POA: Diagnosis not present

## 2019-12-08 DIAGNOSIS — M9903 Segmental and somatic dysfunction of lumbar region: Secondary | ICD-10-CM | POA: Diagnosis not present

## 2019-12-08 DIAGNOSIS — M62830 Muscle spasm of back: Secondary | ICD-10-CM | POA: Diagnosis not present

## 2019-12-08 DIAGNOSIS — M5033 Other cervical disc degeneration, cervicothoracic region: Secondary | ICD-10-CM | POA: Diagnosis not present

## 2020-01-12 DIAGNOSIS — M62830 Muscle spasm of back: Secondary | ICD-10-CM | POA: Diagnosis not present

## 2020-01-12 DIAGNOSIS — M5033 Other cervical disc degeneration, cervicothoracic region: Secondary | ICD-10-CM | POA: Diagnosis not present

## 2020-01-12 DIAGNOSIS — M9903 Segmental and somatic dysfunction of lumbar region: Secondary | ICD-10-CM | POA: Diagnosis not present

## 2020-01-12 DIAGNOSIS — M9901 Segmental and somatic dysfunction of cervical region: Secondary | ICD-10-CM | POA: Diagnosis not present

## 2020-02-09 DIAGNOSIS — M5033 Other cervical disc degeneration, cervicothoracic region: Secondary | ICD-10-CM | POA: Diagnosis not present

## 2020-02-09 DIAGNOSIS — M62830 Muscle spasm of back: Secondary | ICD-10-CM | POA: Diagnosis not present

## 2020-02-09 DIAGNOSIS — M9903 Segmental and somatic dysfunction of lumbar region: Secondary | ICD-10-CM | POA: Diagnosis not present

## 2020-02-09 DIAGNOSIS — M9901 Segmental and somatic dysfunction of cervical region: Secondary | ICD-10-CM | POA: Diagnosis not present

## 2020-02-23 DIAGNOSIS — H43813 Vitreous degeneration, bilateral: Secondary | ICD-10-CM | POA: Diagnosis not present

## 2020-02-23 DIAGNOSIS — H33302 Unspecified retinal break, left eye: Secondary | ICD-10-CM | POA: Diagnosis not present

## 2020-02-23 DIAGNOSIS — H40013 Open angle with borderline findings, low risk, bilateral: Secondary | ICD-10-CM | POA: Diagnosis not present

## 2020-02-23 DIAGNOSIS — H524 Presbyopia: Secondary | ICD-10-CM | POA: Diagnosis not present

## 2020-02-29 DIAGNOSIS — M9901 Segmental and somatic dysfunction of cervical region: Secondary | ICD-10-CM | POA: Diagnosis not present

## 2020-02-29 DIAGNOSIS — M9903 Segmental and somatic dysfunction of lumbar region: Secondary | ICD-10-CM | POA: Diagnosis not present

## 2020-02-29 DIAGNOSIS — M5033 Other cervical disc degeneration, cervicothoracic region: Secondary | ICD-10-CM | POA: Diagnosis not present

## 2020-02-29 DIAGNOSIS — M62830 Muscle spasm of back: Secondary | ICD-10-CM | POA: Diagnosis not present

## 2020-03-05 ENCOUNTER — Other Ambulatory Visit: Payer: Self-pay | Admitting: Family Medicine

## 2020-03-08 DIAGNOSIS — M5033 Other cervical disc degeneration, cervicothoracic region: Secondary | ICD-10-CM | POA: Diagnosis not present

## 2020-03-08 DIAGNOSIS — M9903 Segmental and somatic dysfunction of lumbar region: Secondary | ICD-10-CM | POA: Diagnosis not present

## 2020-03-08 DIAGNOSIS — M9901 Segmental and somatic dysfunction of cervical region: Secondary | ICD-10-CM | POA: Diagnosis not present

## 2020-03-08 DIAGNOSIS — M62830 Muscle spasm of back: Secondary | ICD-10-CM | POA: Diagnosis not present

## 2020-04-03 DIAGNOSIS — H33302 Unspecified retinal break, left eye: Secondary | ICD-10-CM | POA: Diagnosis not present

## 2020-04-03 DIAGNOSIS — H40013 Open angle with borderline findings, low risk, bilateral: Secondary | ICD-10-CM | POA: Diagnosis not present

## 2020-04-03 DIAGNOSIS — H43812 Vitreous degeneration, left eye: Secondary | ICD-10-CM | POA: Diagnosis not present

## 2020-04-06 DIAGNOSIS — H21341 Primary cyst of pars plana, right eye: Secondary | ICD-10-CM | POA: Diagnosis not present

## 2020-04-06 DIAGNOSIS — H43823 Vitreomacular adhesion, bilateral: Secondary | ICD-10-CM | POA: Diagnosis not present

## 2020-04-06 DIAGNOSIS — H35413 Lattice degeneration of retina, bilateral: Secondary | ICD-10-CM | POA: Diagnosis not present

## 2020-04-06 DIAGNOSIS — H43811 Vitreous degeneration, right eye: Secondary | ICD-10-CM | POA: Diagnosis not present

## 2020-04-06 DIAGNOSIS — H33323 Round hole, bilateral: Secondary | ICD-10-CM | POA: Diagnosis not present

## 2020-04-12 DIAGNOSIS — M62830 Muscle spasm of back: Secondary | ICD-10-CM | POA: Diagnosis not present

## 2020-04-12 DIAGNOSIS — M9903 Segmental and somatic dysfunction of lumbar region: Secondary | ICD-10-CM | POA: Diagnosis not present

## 2020-04-12 DIAGNOSIS — M9901 Segmental and somatic dysfunction of cervical region: Secondary | ICD-10-CM | POA: Diagnosis not present

## 2020-04-12 DIAGNOSIS — M5033 Other cervical disc degeneration, cervicothoracic region: Secondary | ICD-10-CM | POA: Diagnosis not present

## 2020-04-27 DIAGNOSIS — H33322 Round hole, left eye: Secondary | ICD-10-CM | POA: Diagnosis not present

## 2020-05-02 DIAGNOSIS — K219 Gastro-esophageal reflux disease without esophagitis: Secondary | ICD-10-CM | POA: Diagnosis not present

## 2020-05-02 DIAGNOSIS — Z8719 Personal history of other diseases of the digestive system: Secondary | ICD-10-CM | POA: Diagnosis not present

## 2020-05-04 DIAGNOSIS — H33321 Round hole, right eye: Secondary | ICD-10-CM | POA: Diagnosis not present

## 2020-05-09 NOTE — Progress Notes (Signed)
Complete physical exam  I,April Miller,acting as a scribe for Wilhemena Durie, MD.,have documented all relevant documentation on the behalf of Wilhemena Durie, MD,as directed by  Wilhemena Durie, MD while in the presence of Wilhemena Durie, MD.   Patient: [name]   DOB: Jun 19, 1956   64 y.o. Male  MRN: [id number] Visit Date: 05/10/2020  Today's healthcare provider: Wilhemena Durie, MD   Chief Complaint  Patient presents with  . Annual Exam   Subjective    [name] is a 64 y.o. male who presents today for a complete physical exam.  He reports consuming a general diet. Home exercise routine includes walking. He generally feels well. He reports sleeping well. He does not have additional problems to discuss today.  Patient has had Covid vaccines. HPI    Past Medical History:  Diagnosis Date  . GERD (gastroesophageal reflux disease)   . Hx of basal cell carcinoma    multiple sites  . Hx of dysplastic nevus    multiple sites  . Hx of malignant melanoma 11/11/2013   L lateral infraclavicular, superficial spreading, Breslow's 0.37mm, Clark's level III  . Hypothyroidism    Past Surgical History:  Procedure Laterality Date  . COLONOSCOPY WITH PROPOFOL N/A 01/20/2017   Procedure: COLONOSCOPY WITH PROPOFOL;  Surgeon: Lollie Sails, MD;  Location: [facility] ENDOSCOPY;  Service: Endoscopy;  [name];  . ESOPHAGOGASTRODUODENOSCOPY (EGD) WITH ESOPHAGEAL DILATION    . MELANOMA EXCISION Right 11/24/2013   lateral infraclavicular  . WISDOM TOOTH EXTRACTION     Social History   Socioeconomic History  . Marital status: Married    Spouse name: Not on file  . Number of children: Not on file  . Years of education: Not on file  . Highest education level: Not on file  Occupational History  . Not on file  Tobacco Use  . Smoking status: Never Smoker  . Smokeless tobacco: Never Used  Substance and Sexual Activity  . Alcohol use: Yes    Comment:  about 1 glass of wine with dinner  . Drug use: No  . Sexual activity: Not on file  Other Topics Concern  . Not on file  Social History Narrative  . Not on file   Social Determinants of Health   Financial Resource Strain:   . Difficulty of Paying Living Expenses:   Food Insecurity:   . Worried About Charity fundraiser in the Last Year:   . Arboriculturist in the Last Year:   Transportation Needs:   . Film/video editor (Medical):   Marland Kitchen Lack of Transportation (Non-Medical):   Physical Activity:   . Days of Exercise per Week:   . Minutes of Exercise per Session:   Stress:   . Feeling of Stress :   Social Connections:   . Frequency of Communication with Friends and Family:   . Frequency of Social Gatherings with Friends and Family:   . Attends Religious Services:   . Active Member of Clubs or Organizations:   . Attends Archivist Meetings:   Marland Kitchen Marital Status:   Intimate Partner Violence:   . Fear of Current or Ex-Partner:   . Emotionally Abused:   Marland Kitchen Physically Abused:   . Sexually Abused:    Family Status  Relation Name Status  . Mother  Alive  . Father  Deceased at age 65  . Brother  Alive       has  enlarged prostate and had hernia surgery   Family History  Problem Relation Age of Onset  . Macular degeneration Mother   . Hyperlipidemia Mother   . Heart disease Father   . Heart attack Father   . Lung cancer Father        metastasized   No Known Allergies  Patient Care Team: Jerrol Banana., MD as PCP - General (Family Medicine)   Medications: Outpatient Medications Prior to Visit  Medication Sig  . aspirin 81 MG tablet Take by mouth.  . levothyroxine (SYNTHROID) 50 MCG tablet TAKE 1 TABLET BY MOUTH EVERY DAY  . Multiple Vitamins tablet Take by mouth.  . Multiple Vitamins-Minerals (PRESERVISION AREDS 2 PO) Take by mouth.  . pantoprazole (PROTONIX) 20 MG tablet Take 1 tablet by mouth daily.  . vitamin C (ASCORBIC ACID) 500 MG tablet Take  500 mg by mouth daily.  . Elastic Bandages & Supports (WRIST SPLINT/COCK-UP/LEFT L) MISC 1 each by Does not apply route daily. (Patient not taking: Reported on 05/10/2019)  . Elastic Bandages & Supports (WRIST SPLINT/COCK-UP/RIGHT L) MISC 1 each by Does not apply route daily. (Patient not taking: Reported on 05/10/2019)  . FLUARIX QUADRIVALENT 0.5 ML injection inject 0.5 milliliter intramuscularly (Patient not taking: Reported on 05/10/2020)   No facility-administered medications prior to visit.    Review of Systems  Constitutional: Negative.   HENT: Negative.   Eyes: Negative.   Respiratory: Negative.   Cardiovascular: Negative.   Gastrointestinal: Negative.   Endocrine: Negative.   Genitourinary: Negative.   Musculoskeletal: Positive for back pain and neck stiffness.  Skin: Negative.   Allergic/Immunologic: Negative.   Neurological: Negative.   Hematological: Negative.   Psychiatric/Behavioral: Negative.       Objective    BP (!) 164/89 (BP Location: Right Arm, Patient Position: Sitting, Cuff Size: Large)   Pulse (!) 58   Temp 98.3 F (36.8 C) (Oral)   Resp 16   Ht 6\' 1"  (1.854 m)   Wt 196 lb (88.9 kg)   SpO2 96%   BMI 25.86 kg/m    Physical Exam Constitutional:      Appearance: Normal appearance. He is normal weight.  HENT:     Head: Normocephalic and atraumatic.     Right Ear: Tympanic membrane, ear canal and external ear normal.     Left Ear: Tympanic membrane, ear canal and external ear normal.     Nose: Nose normal.     Mouth/Throat:     Mouth: Mucous membranes are moist.     Pharynx: Oropharynx is clear.  Eyes:     Extraocular Movements: Extraocular movements intact.     Conjunctiva/sclera: Conjunctivae normal.     Pupils: Pupils are equal, round, and reactive to light.  Cardiovascular:     Rate and Rhythm: Normal rate and regular rhythm.     Pulses: Normal pulses.     Heart sounds: Normal heart sounds.  Pulmonary:     Effort: Pulmonary effort is normal.       Breath sounds: Normal breath sounds.  Abdominal:     General: Abdomen is flat. Bowel sounds are normal.     Palpations: Abdomen is soft.  Genitourinary:    Penis: Normal.      Testes: Normal.     Prostate: Normal.     Rectum: Normal.  Musculoskeletal:        General: Normal range of motion.     Cervical back: Normal range of motion and neck supple.  Skin:    General: Skin is warm and dry.  Neurological:     General: No focal deficit present.     Mental Status: He is alert and oriented to person, place, and time. Mental status is at baseline.  Psychiatric:        Mood and Affect: Mood normal.        Behavior: Behavior normal.        Thought Content: Thought content normal.        Judgment: Judgment normal.       Last depression screening scores PHQ 2/9 Scores 05/10/2020 05/10/2019 10/30/2017  PHQ - 2 Score 0 0 0  PHQ- 9 Score 0 - -   Last fall risk screening Fall Risk  05/10/2020  Falls in the past year? 0  Number falls in past yr: 0  Injury with Fall? 0  Follow up -   Last Audit-C alcohol use screening Alcohol Use Disorder Test (AUDIT) 05/10/2020  1. How often do you have a drink containing alcohol? 3  2. How many drinks containing alcohol do you have on a typical day when you are drinking? 1  3. How often do you have six or more drinks on one occasion? 1  AUDIT-C Score 5  4. How often during the last year have you found that you were not able to stop drinking once you had started? -  5. How often during the last year have you failed to do what was normally expected from you because of drinking? -  6. How often during the last year have you needed a first drink in the morning to get yourself going after a heavy drinking session? -  7. How often during the last year have you had a feeling of guilt of remorse after drinking? -  8. How often during the last year have you been unable to remember what happened the night before because you had been drinking? -  9. Have you or  someone else been injured as a result of your drinking? -  10. Has a relative or friend or a doctor or another health worker been concerned about your drinking or suggested you cut down? -  Alcohol Use Disorder Identification Test Final Score (AUDIT) -  Alcohol Brief Interventions/Follow-up -   A score of 3 or more in women, and 4 or more in men indicates increased risk for alcohol abuse, EXCEPT if all of the points are from question 1   No results found for any visits on 05/10/20.  Assessment & Plan    Routine Health Maintenance and Physical Exam  Exercise Activities and Dietary recommendations Goals   None     Immunization History  Administered Date(s) Administered  . Fluad Quad(high Dose 65+) 06/08/2019  . Influenza,inj,Quad PF,6+ Mos 08/01/2016, 07/15/2018  . Influenza,inj,Quad PF,6-35 Mos 05/29/2017  . Influenza-Unspecified 08/07/2015  . Pneumococcal Polysaccharide-23 05/10/2019  . Td 10/29/2016  . Tdap 12/01/2006  . Zoster 10/19/2014    Health Maintenance  Topic Date Due  . COVID-19 Vaccine (1) Never done  . INFLUENZA VACCINE  05/07/2020  . PNA vac Low Risk Adult (2 of 2 - PCV13) 05/09/2020  . TETANUS/TDAP  10/29/2026  . COLONOSCOPY  01/21/2027  . Hepatitis C Screening  Completed    Discussed health benefits of physical activity, and encouraged him to engage in regular exercise appropriate for his 1. Annual physical exam Anoscopy 2028  2. Medicare annual wellness visit, subsequent  - CBC  - Comprehensive metabolic panel -  Lipid panel - TSH  3. Encounter for hepatitis C screening test for low risk patient  - Hepatitis C antibody  4. Adult hypothyroidism  - CBC - Comprehensive metabolic panel - Lipid panel - TSH  5. Pure hypercholesterolemia  - CBC - Comprehensive metabolic panel - Lipid panel - TSH  6. Screening for blood or protein in urine  - POCT urinalysis dipstick  7. Encounter for screening fecal occult blood testing  - IFOBT POC  (occult bld, rslt in office); Future - IFOBT POC (occult bld, rslt in office)   Return in 1 year (on 05/15/2021) for AWV.        Dorann Davidson Cranford Mon, MD  [facility] [phone] (phone) [phone] (fax)  [place]

## 2020-05-10 ENCOUNTER — Encounter: Payer: Self-pay | Admitting: Family Medicine

## 2020-05-10 ENCOUNTER — Other Ambulatory Visit: Payer: Self-pay

## 2020-05-10 ENCOUNTER — Ambulatory Visit (INDEPENDENT_AMBULATORY_CARE_PROVIDER_SITE_OTHER): Payer: Medicare HMO | Admitting: Family Medicine

## 2020-05-10 VITALS — BP 164/89 | HR 58 | Temp 98.3°F | Resp 16 | Ht 73.0 in | Wt 196.0 lb

## 2020-05-10 DIAGNOSIS — Z1211 Encounter for screening for malignant neoplasm of colon: Secondary | ICD-10-CM

## 2020-05-10 DIAGNOSIS — Z0000 Encounter for general adult medical examination without abnormal findings: Secondary | ICD-10-CM | POA: Diagnosis not present

## 2020-05-10 DIAGNOSIS — Z1389 Encounter for screening for other disorder: Secondary | ICD-10-CM

## 2020-05-10 DIAGNOSIS — E039 Hypothyroidism, unspecified: Secondary | ICD-10-CM

## 2020-05-10 DIAGNOSIS — Z1159 Encounter for screening for other viral diseases: Secondary | ICD-10-CM

## 2020-05-10 DIAGNOSIS — E7800 Pure hypercholesterolemia, unspecified: Secondary | ICD-10-CM | POA: Diagnosis not present

## 2020-05-10 LAB — POCT URINALYSIS DIPSTICK
Appearance: NORMAL
Bilirubin, UA: NEGATIVE
Blood, UA: NEGATIVE
Glucose, UA: NEGATIVE
Ketones, UA: NEGATIVE
Leukocytes, UA: NEGATIVE
Nitrite, UA: NEGATIVE
Odor: NORMAL
Protein, UA: NEGATIVE
Spec Grav, UA: 1.01 (ref 1.010–1.025)
Urobilinogen, UA: 0.2 E.U./dL
pH, UA: 6 (ref 5.0–8.0)

## 2020-05-10 LAB — IFOBT (OCCULT BLOOD): IFOBT: NEGATIVE

## 2020-05-11 LAB — CBC
Hematocrit: 43.8 % (ref 37.5–51.0)
Hemoglobin: 14.8 g/dL (ref 13.0–17.7)
MCH: 31.8 pg (ref 26.6–33.0)
MCHC: 33.8 g/dL (ref 31.5–35.7)
MCV: 94 fL (ref 79–97)
Platelets: 220 10*3/uL (ref 150–450)
RBC: 4.66 x10E6/uL (ref 4.14–5.80)
RDW: 13.2 % (ref 11.6–15.4)
WBC: 4.9 10*3/uL (ref 3.4–10.8)

## 2020-05-11 LAB — COMPREHENSIVE METABOLIC PANEL
ALT: 12 IU/L (ref 0–44)
AST: 16 IU/L (ref 0–40)
Albumin/Globulin Ratio: 2.3 — ABNORMAL HIGH (ref 1.2–2.2)
Albumin: 4.2 g/dL (ref 3.8–4.8)
Alkaline Phosphatase: 58 IU/L (ref 48–121)
BUN/Creatinine Ratio: 11 (ref 10–24)
BUN: 13 mg/dL (ref 8–27)
Bilirubin Total: 0.4 mg/dL (ref 0.0–1.2)
CO2: 25 mmol/L (ref 20–29)
Calcium: 9.3 mg/dL (ref 8.6–10.2)
Chloride: 103 mmol/L (ref 96–106)
Creatinine, Ser: 1.17 mg/dL (ref 0.76–1.27)
GFR calc Af Amer: 75 mL/min/{1.73_m2} (ref >59)
GFR calc non Af Amer: 65 mL/min/{1.73_m2} (ref >59)
Globulin, Total: 1.8 g/dL (ref 1.5–4.5)
Glucose: 84 mg/dL (ref 65–99)
Potassium: 4.4 mmol/L (ref 3.5–5.2)
Sodium: 141 mmol/L (ref 134–144)
Total Protein: 6 g/dL (ref 6.0–8.5)

## 2020-05-11 LAB — LIPID PANEL
Chol/HDL Ratio: 3.1 ratio (ref 0.0–5.0)
Cholesterol, Total: 202 mg/dL — ABNORMAL HIGH (ref 100–199)
HDL: 65 mg/dL (ref >39)
LDL Chol Calc (NIH): 125 mg/dL — ABNORMAL HIGH (ref 0–99)
Triglycerides: 68 mg/dL (ref 0–149)
VLDL Cholesterol Cal: 12 mg/dL (ref 5–40)

## 2020-05-11 LAB — HEPATITIS C ANTIBODY: Hep C Virus Ab: <0.1 s/co ratio (ref 0.0–0.9)

## 2020-05-11 LAB — TSH: TSH: 3.76 u[IU]/mL (ref 0.450–4.500)

## 2020-05-17 DIAGNOSIS — M62830 Muscle spasm of back: Secondary | ICD-10-CM | POA: Diagnosis not present

## 2020-05-17 DIAGNOSIS — M9901 Segmental and somatic dysfunction of cervical region: Secondary | ICD-10-CM | POA: Diagnosis not present

## 2020-05-17 DIAGNOSIS — M9903 Segmental and somatic dysfunction of lumbar region: Secondary | ICD-10-CM | POA: Diagnosis not present

## 2020-05-17 DIAGNOSIS — M5033 Other cervical disc degeneration, cervicothoracic region: Secondary | ICD-10-CM | POA: Diagnosis not present

## 2020-06-14 DIAGNOSIS — M9903 Segmental and somatic dysfunction of lumbar region: Secondary | ICD-10-CM | POA: Diagnosis not present

## 2020-06-14 DIAGNOSIS — M9901 Segmental and somatic dysfunction of cervical region: Secondary | ICD-10-CM | POA: Diagnosis not present

## 2020-06-14 DIAGNOSIS — M62830 Muscle spasm of back: Secondary | ICD-10-CM | POA: Diagnosis not present

## 2020-06-14 DIAGNOSIS — M5033 Other cervical disc degeneration, cervicothoracic region: Secondary | ICD-10-CM | POA: Diagnosis not present

## 2020-07-04 DIAGNOSIS — M62830 Muscle spasm of back: Secondary | ICD-10-CM | POA: Diagnosis not present

## 2020-07-04 DIAGNOSIS — M9901 Segmental and somatic dysfunction of cervical region: Secondary | ICD-10-CM | POA: Diagnosis not present

## 2020-07-04 DIAGNOSIS — M9903 Segmental and somatic dysfunction of lumbar region: Secondary | ICD-10-CM | POA: Diagnosis not present

## 2020-07-04 DIAGNOSIS — M5033 Other cervical disc degeneration, cervicothoracic region: Secondary | ICD-10-CM | POA: Diagnosis not present

## 2020-07-04 DIAGNOSIS — R69 Illness, unspecified: Secondary | ICD-10-CM | POA: Diagnosis not present

## 2020-07-05 DIAGNOSIS — M25532 Pain in left wrist: Secondary | ICD-10-CM | POA: Diagnosis not present

## 2020-07-17 DIAGNOSIS — M62830 Muscle spasm of back: Secondary | ICD-10-CM | POA: Diagnosis not present

## 2020-07-17 DIAGNOSIS — M9903 Segmental and somatic dysfunction of lumbar region: Secondary | ICD-10-CM | POA: Diagnosis not present

## 2020-07-17 DIAGNOSIS — M5033 Other cervical disc degeneration, cervicothoracic region: Secondary | ICD-10-CM | POA: Diagnosis not present

## 2020-07-17 DIAGNOSIS — M9901 Segmental and somatic dysfunction of cervical region: Secondary | ICD-10-CM | POA: Diagnosis not present

## 2020-07-21 DIAGNOSIS — M5033 Other cervical disc degeneration, cervicothoracic region: Secondary | ICD-10-CM | POA: Diagnosis not present

## 2020-07-21 DIAGNOSIS — M62830 Muscle spasm of back: Secondary | ICD-10-CM | POA: Diagnosis not present

## 2020-07-21 DIAGNOSIS — M9903 Segmental and somatic dysfunction of lumbar region: Secondary | ICD-10-CM | POA: Diagnosis not present

## 2020-07-21 DIAGNOSIS — M9901 Segmental and somatic dysfunction of cervical region: Secondary | ICD-10-CM | POA: Diagnosis not present

## 2020-07-26 DIAGNOSIS — M62830 Muscle spasm of back: Secondary | ICD-10-CM | POA: Diagnosis not present

## 2020-07-26 DIAGNOSIS — M9901 Segmental and somatic dysfunction of cervical region: Secondary | ICD-10-CM | POA: Diagnosis not present

## 2020-07-26 DIAGNOSIS — M9903 Segmental and somatic dysfunction of lumbar region: Secondary | ICD-10-CM | POA: Diagnosis not present

## 2020-07-26 DIAGNOSIS — M25532 Pain in left wrist: Secondary | ICD-10-CM | POA: Diagnosis not present

## 2020-07-26 DIAGNOSIS — M5033 Other cervical disc degeneration, cervicothoracic region: Secondary | ICD-10-CM | POA: Diagnosis not present

## 2020-08-09 DIAGNOSIS — M9901 Segmental and somatic dysfunction of cervical region: Secondary | ICD-10-CM | POA: Diagnosis not present

## 2020-08-09 DIAGNOSIS — M5033 Other cervical disc degeneration, cervicothoracic region: Secondary | ICD-10-CM | POA: Diagnosis not present

## 2020-08-09 DIAGNOSIS — M9903 Segmental and somatic dysfunction of lumbar region: Secondary | ICD-10-CM | POA: Diagnosis not present

## 2020-08-09 DIAGNOSIS — M62830 Muscle spasm of back: Secondary | ICD-10-CM | POA: Diagnosis not present

## 2020-08-16 DIAGNOSIS — H33323 Round hole, bilateral: Secondary | ICD-10-CM | POA: Diagnosis not present

## 2020-08-16 DIAGNOSIS — H43823 Vitreomacular adhesion, bilateral: Secondary | ICD-10-CM | POA: Diagnosis not present

## 2020-08-16 DIAGNOSIS — H31093 Other chorioretinal scars, bilateral: Secondary | ICD-10-CM | POA: Diagnosis not present

## 2020-08-16 DIAGNOSIS — H21341 Primary cyst of pars plana, right eye: Secondary | ICD-10-CM | POA: Diagnosis not present

## 2020-08-16 DIAGNOSIS — H35413 Lattice degeneration of retina, bilateral: Secondary | ICD-10-CM | POA: Diagnosis not present

## 2020-08-30 ENCOUNTER — Other Ambulatory Visit: Payer: Self-pay | Admitting: Family Medicine

## 2020-09-06 DIAGNOSIS — M62830 Muscle spasm of back: Secondary | ICD-10-CM | POA: Diagnosis not present

## 2020-09-06 DIAGNOSIS — M5033 Other cervical disc degeneration, cervicothoracic region: Secondary | ICD-10-CM | POA: Diagnosis not present

## 2020-09-06 DIAGNOSIS — M9903 Segmental and somatic dysfunction of lumbar region: Secondary | ICD-10-CM | POA: Diagnosis not present

## 2020-09-06 DIAGNOSIS — M9901 Segmental and somatic dysfunction of cervical region: Secondary | ICD-10-CM | POA: Diagnosis not present

## 2020-09-11 DIAGNOSIS — M25571 Pain in right ankle and joints of right foot: Secondary | ICD-10-CM | POA: Diagnosis not present

## 2020-09-25 ENCOUNTER — Other Ambulatory Visit: Payer: Self-pay

## 2020-09-25 ENCOUNTER — Ambulatory Visit (INDEPENDENT_AMBULATORY_CARE_PROVIDER_SITE_OTHER): Payer: Medicare HMO | Admitting: Dermatology

## 2020-09-25 DIAGNOSIS — Z86018 Personal history of other benign neoplasm: Secondary | ICD-10-CM | POA: Diagnosis not present

## 2020-09-25 DIAGNOSIS — Z85828 Personal history of other malignant neoplasm of skin: Secondary | ICD-10-CM

## 2020-09-25 DIAGNOSIS — D1800 Hemangioma unspecified site: Secondary | ICD-10-CM

## 2020-09-25 DIAGNOSIS — L570 Actinic keratosis: Secondary | ICD-10-CM

## 2020-09-25 DIAGNOSIS — Z85820 Personal history of malignant melanoma of skin: Secondary | ICD-10-CM

## 2020-09-25 DIAGNOSIS — L578 Other skin changes due to chronic exposure to nonionizing radiation: Secondary | ICD-10-CM | POA: Diagnosis not present

## 2020-09-25 DIAGNOSIS — L821 Other seborrheic keratosis: Secondary | ICD-10-CM | POA: Diagnosis not present

## 2020-09-25 DIAGNOSIS — L814 Other melanin hyperpigmentation: Secondary | ICD-10-CM | POA: Diagnosis not present

## 2020-09-25 DIAGNOSIS — D229 Melanocytic nevi, unspecified: Secondary | ICD-10-CM

## 2020-09-25 DIAGNOSIS — D1723 Benign lipomatous neoplasm of skin and subcutaneous tissue of right leg: Secondary | ICD-10-CM | POA: Diagnosis not present

## 2020-09-25 DIAGNOSIS — Z1283 Encounter for screening for malignant neoplasm of skin: Secondary | ICD-10-CM

## 2020-09-25 NOTE — Progress Notes (Signed)
Follow-Up Visit   Subjective  [name] is a 64 y.o. male who presents for the following: Annual Exam (Patient here today for TBSE. He has a history of MM, Dysplastic Nevi and BCC. Patient not aware of anything new or changing today. ). The patient presents for Total-Body Skin Exam (TBSE) for skin cancer screening and mole check.  The following portions of the chart were reviewed this encounter and updated as appropriate:   Tobacco  Allergies  Meds  Problems  Med Hx  Surg Hx  Fam Hx     Review of Systems:  No other skin or systemic complaints except as noted in HPI or Assessment and Plan.  Objective  Well appearing patient in no apparent distress; mood and affect are within normal limits.  A full examination was performed including scalp, head, eyes, ears, nose, lips, neck, chest, axillae, abdomen, back, buttocks, bilateral upper extremities, bilateral lower extremities, hands, feet, fingers, toes, fingernails, and toenails. All findings within normal limits unless otherwise noted below.  Objective  Scalp x 8 (8): Erythematous thin papules/macules with gritty scale.    Assessment & Plan  AK (actinic keratosis) (8) Scalp x 8  Destruction of lesion - Scalp x 8 Complexity: simple   Destruction method: cryotherapy   Informed consent: discussed and consent obtained   Timeout:  patient name, date of birth, surgical site, and procedure verified Lesion destroyed using liquid nitrogen: Yes   Region frozen until ice ball extended beyond lesion: Yes   Outcome: patient tolerated procedure well with no complications   Post-procedure details: wound care instructions given    Lipoma of right lower extremity Right Thigh - Anterior  Benign-appearing.  Observation.  Call clinic for new or changing lesions.  Recommend daily use of broad spectrum spf 30+ sunscreen to sun-exposed areas.   Lentigines - Scattered tan macules - Discussed due to sun exposure - Benign, observe -  Call for any changes  Seborrheic Keratoses - Stuck-on, waxy, tan-brown papules and plaques  - Discussed benign etiology and prognosis. - Observe - Call for any changes  Melanocytic Nevi - Tan-brown and/or pink-flesh-colored symmetric macules and papules - Benign appearing on exam today - Observation - Call clinic for new or changing moles - Recommend daily use of broad spectrum spf 30+ sunscreen to sun-exposed areas.   Hemangiomas - Red papules - Discussed benign nature - Observe - Call for any changes  Actinic Damage - chronic, secondary to cumulative UV radiation exposure/sun exposure over time - diffuse scaly erythematous macules with underlying dyspigmentation - Recommend daily broad spectrum sunscreen SPF 30+ to sun-exposed areas, reapply every 2 hours as needed.  - Call for new or changing lesions.  Severe, Confluent Chronic Actinic Changes with Pre-Cancerous Actinic Keratoses due to cumulative sun exposure/UV radiation exposure over time - Discussed Prescription "Field Treatment" to forehead, scalp and temples Field treatment involves treatment of an entire area of skin that has confluent Actinic Changes (Sun/ Ultraviolet light damage) and PreCancerous Actinic Keratoses by method of PhotoDynamic Therapy (PDT) and/or prescription Topical Chemotherapy agents such as 5-fluorouracil, 5-fluorouracil/calcipotriene, and/or imiquimod.  The purpose is to decrease the number of clinically evident and subclinical PreCancerous lesions to prevent progression to development of skin cancer by chemically destroying early precancer changes that may or may not be visible.  It has been shown to reduce the risk of developing skin cancer in the treated area. As a result of treatment, redness, scaling, crusting, and open sores may occur during treatment course.  One or more than one of these methods may be used and may have to be used several times to control, suppress and eliminate the PreCancerous  changes. Discussed treatment course, expected reaction, and possible side effects.  Skin cancer screening performed today.  History of Dysplastic Nevi - No evidence of recurrence today - Recommend regular full body skin exams - Recommend daily broad spectrum sunscreen SPF 30+ to sun-exposed areas, reapply every 2 hours as needed.  - Call if any new or changing lesions are noted between office visits  History of Basal Cell Carcinoma of the Skin - No evidence of recurrence today - Recommend regular full body skin exams - Recommend daily broad spectrum sunscreen SPF 30+ to sun-exposed areas, reapply every 2 hours as needed.  - Call if any new or changing lesions are noted between office visits  History of Melanoma - No evidence of recurrence today at left lateral infraclavicular, Level III - No lymphadenopathy - Recommend regular full body skin exams - Recommend daily broad spectrum sunscreen SPF 30+ to sun-exposed areas, reapply every 2 hours as needed.  - Call if any new or changing lesions are noted between office visits  Return in about 15 weeks (around 01/08/2021) for AK follow up.  Graciella Belton, RMA, am acting as scribe for Sarina Ser, MD . Documentation: I have reviewed the above documentation for accuracy and completeness, and I agree with the above.  Sarina Ser, MD

## 2020-09-25 NOTE — Patient Instructions (Signed)
Melanoma ABCDEs  Melanoma is the most dangerous type of skin cancer, and is the leading cause of death from skin disease.  You are more likely to develop melanoma if you:  Have light-colored skin, light-colored eyes, or red or blond hair  Spend a lot of time in the sun  Tan regularly, either outdoors or in a tanning bed  Have had blistering sunburns, especially during childhood  Have a close family member who has had a melanoma  Have atypical moles or large birthmarks  Early detection of melanoma is key since treatment is typically straightforward and cure rates are extremely high if we catch it early.   The first sign of melanoma is often a change in a mole or a new dark spot.  The ABCDE system is a way of remembering the signs of melanoma.  A for asymmetry:  The two halves do not match. B for border:  The edges of the growth are irregular. C for color:  A mixture of colors are present instead of an even brown color. D for diameter:  Melanomas are usually (but not always) greater than 61mm - the size of a pencil eraser. E for evolution:  The spot keeps changing in size, shape, and color.  Please check your skin once per month between visits. You can use a small mirror in front and a large mirror behind you to keep an eye on the back side or your body.   If you see any new or changing lesions before your next follow-up, please call to schedule a visit.  Please continue daily skin protection including broad spectrum sunscreen SPF 30+ to sun-exposed areas, reapplying every 2 hours as needed when you're outdoors.   Cryotherapy Aftercare  . Wash gently with soap and water everyday.   Marland Kitchen Apply Vaseline and Band-Aid daily until healed.  Instructions for [facility] Medications  One or more of your medications was sent to the [facility] mail order compounding pharmacy. You will receive an email from them and can purchase the medicine through that link. It will then be mailed to  your home at the address you confirmed. If for any reason you do not receive an email from them, please check your spam folder. If you still do not find the email, please let us know. [facility] phone number is [phone].  5-Fluorouracil/Calcipotriene Patient Education   Actinic keratoses are the dry, red scaly spots on the skin caused by sun damage. A portion of these spots can turn into skin cancer with time, and treating them can help prevent development of skin cancer.   Treatment of these spots requires removal of the defective skin cells. There are various ways to remove actinic keratoses, including freezing with liquid nitrogen, treatment with creams, or treatment with a blue light procedure in the office.   5-fluorouracil cream is a topical cream used to treat actinic keratoses. It works by interfering with the growth of abnormal fast-growing skin cells, such as actinic keratoses. These cells peel off and are replaced by healthy ones.   5-fluorouracil/calcipotriene is a combination of the 5-fluorouracil cream with a vitamin D analog cream called calcipotriene. The calcipotriene alone does not treat actinic keratoses. However, when it is combined with 5-fluorouracil, it helps the 5-fluorouracil treat the actinic keratoses much faster so that the same results can be achieved with a much shorter treatment time.  INSTRUCTIONS FOR 5-FLUOROURACIL/CALCIPOTRIENE CREAM:   5-fluorouracil/calcipotriene cream typically only needs to be used for 7 days to  scalp, forehead and temples. A thin layer should be applied twice a day to the treatment areas recommended by your physician.   If your physician prescribed you separate tubes of 5-fluourouracil and calcipotriene, apply a thin layer of 5-fluorouracil followed by a thin layer of calcipotriene.   Avoid contact with your eyes, nostrils, and mouth. Do not use 5-fluorouracil/calcipotriene cream on infected or open wounds.   You will develop  redness, irritation and some crusting at areas where you have pre-cancer damage/actinic keratoses. IF YOU DEVELOP PAIN, BLEEDING, OR SIGNIFICANT CRUSTING, STOP THE TREATMENT EARLY - you have already gotten a good response and the actinic keratoses should clear up well.  Wash your hands after applying 5-fluorouracil 5% cream on your skin.   A moisturizer or sunscreen with a minimum SPF 30 should be applied each morning.   Once you have finished the treatment, you can apply a thin layer of Vaseline twice a day to irritated areas to soothe and calm the areas more quickly. If you experience significant discomfort, contact your physician.  For some patients it is necessary to repeat the treatment for best results.  SIDE EFFECTS: When using 5-fluorouracil/calcipotriene cream, you may have mild irritation, such as redness, dryness, swelling, or a mild burning sensation. This usually resolves within 2 weeks. The more actinic keratoses you have, the more redness and inflammation you can expect during treatment. Eye irritation has been reported rarely. If this occurs, please let us know.  If you have any trouble using this cream, please call the office. If you have any other questions about this information, please do not hesitate to ask me before you leave the office.

## 2020-10-04 ENCOUNTER — Encounter: Payer: Self-pay | Admitting: Dermatology

## 2020-10-07 HISTORY — PX: WRIST SURGERY: SHX841

## 2020-10-10 DIAGNOSIS — H40013 Open angle with borderline findings, low risk, bilateral: Secondary | ICD-10-CM | POA: Diagnosis not present

## 2020-10-11 DIAGNOSIS — M9903 Segmental and somatic dysfunction of lumbar region: Secondary | ICD-10-CM | POA: Diagnosis not present

## 2020-10-11 DIAGNOSIS — M5033 Other cervical disc degeneration, cervicothoracic region: Secondary | ICD-10-CM | POA: Diagnosis not present

## 2020-10-11 DIAGNOSIS — M62830 Muscle spasm of back: Secondary | ICD-10-CM | POA: Diagnosis not present

## 2020-10-11 DIAGNOSIS — M9901 Segmental and somatic dysfunction of cervical region: Secondary | ICD-10-CM | POA: Diagnosis not present

## 2020-10-16 DIAGNOSIS — M25571 Pain in right ankle and joints of right foot: Secondary | ICD-10-CM | POA: Diagnosis not present

## 2020-10-16 DIAGNOSIS — M25532 Pain in left wrist: Secondary | ICD-10-CM | POA: Diagnosis not present

## 2020-11-08 DIAGNOSIS — M9903 Segmental and somatic dysfunction of lumbar region: Secondary | ICD-10-CM | POA: Diagnosis not present

## 2020-11-08 DIAGNOSIS — M62830 Muscle spasm of back: Secondary | ICD-10-CM | POA: Diagnosis not present

## 2020-11-08 DIAGNOSIS — M9901 Segmental and somatic dysfunction of cervical region: Secondary | ICD-10-CM | POA: Diagnosis not present

## 2020-11-08 DIAGNOSIS — M5033 Other cervical disc degeneration, cervicothoracic region: Secondary | ICD-10-CM | POA: Diagnosis not present

## 2020-11-15 DIAGNOSIS — M25532 Pain in left wrist: Secondary | ICD-10-CM | POA: Diagnosis not present

## 2020-11-26 ENCOUNTER — Other Ambulatory Visit: Payer: Self-pay | Admitting: Family Medicine

## 2020-12-06 DIAGNOSIS — M5033 Other cervical disc degeneration, cervicothoracic region: Secondary | ICD-10-CM | POA: Diagnosis not present

## 2020-12-06 DIAGNOSIS — M9901 Segmental and somatic dysfunction of cervical region: Secondary | ICD-10-CM | POA: Diagnosis not present

## 2020-12-06 DIAGNOSIS — M62830 Muscle spasm of back: Secondary | ICD-10-CM | POA: Diagnosis not present

## 2020-12-06 DIAGNOSIS — M9903 Segmental and somatic dysfunction of lumbar region: Secondary | ICD-10-CM | POA: Diagnosis not present

## 2020-12-11 DIAGNOSIS — M62830 Muscle spasm of back: Secondary | ICD-10-CM | POA: Diagnosis not present

## 2020-12-11 DIAGNOSIS — M5033 Other cervical disc degeneration, cervicothoracic region: Secondary | ICD-10-CM | POA: Diagnosis not present

## 2020-12-11 DIAGNOSIS — M9901 Segmental and somatic dysfunction of cervical region: Secondary | ICD-10-CM | POA: Diagnosis not present

## 2020-12-11 DIAGNOSIS — M9903 Segmental and somatic dysfunction of lumbar region: Secondary | ICD-10-CM | POA: Diagnosis not present

## 2020-12-13 DIAGNOSIS — M25532 Pain in left wrist: Secondary | ICD-10-CM | POA: Diagnosis not present

## 2020-12-13 DIAGNOSIS — M654 Radial styloid tenosynovitis [de Quervain]: Secondary | ICD-10-CM | POA: Diagnosis not present

## 2021-01-04 DIAGNOSIS — M654 Radial styloid tenosynovitis [de Quervain]: Secondary | ICD-10-CM | POA: Diagnosis not present

## 2021-01-10 DIAGNOSIS — M62830 Muscle spasm of back: Secondary | ICD-10-CM | POA: Diagnosis not present

## 2021-01-10 DIAGNOSIS — M9903 Segmental and somatic dysfunction of lumbar region: Secondary | ICD-10-CM | POA: Diagnosis not present

## 2021-01-10 DIAGNOSIS — M9901 Segmental and somatic dysfunction of cervical region: Secondary | ICD-10-CM | POA: Diagnosis not present

## 2021-01-10 DIAGNOSIS — M5033 Other cervical disc degeneration, cervicothoracic region: Secondary | ICD-10-CM | POA: Diagnosis not present

## 2021-01-23 ENCOUNTER — Encounter: Payer: Self-pay | Admitting: Dermatology

## 2021-01-23 ENCOUNTER — Other Ambulatory Visit: Payer: Self-pay

## 2021-01-23 ENCOUNTER — Ambulatory Visit (INDEPENDENT_AMBULATORY_CARE_PROVIDER_SITE_OTHER): Payer: Medicare HMO | Admitting: Dermatology

## 2021-01-23 DIAGNOSIS — L570 Actinic keratosis: Secondary | ICD-10-CM | POA: Diagnosis not present

## 2021-01-23 DIAGNOSIS — L578 Other skin changes due to chronic exposure to nonionizing radiation: Secondary | ICD-10-CM

## 2021-01-23 NOTE — Progress Notes (Signed)
   Follow-Up Visit   Subjective  [name] is a 65 y.o. male who presents for the following: Actinic Keratosis (Scalp and face - S/P 5FU/Calcipotriene mix x 7 days).  The following portions of the chart were reviewed this encounter and updated as appropriate:   Tobacco  Allergies  Meds  Problems  Med Hx  Surg Hx  Fam Hx     Review of Systems:  No other skin or systemic complaints except as noted in HPI or Assessment and Plan.  Objective  Well appearing patient in no apparent distress; mood and affect are within normal limits.  A focused examination was performed including the face and scalp . Relevant physical exam findings are noted in the Assessment and Plan.  Objective  Scalp x 7, R ear x 2 (9): Erythematous thin papules/macules with gritty scale.   Assessment & Plan  AK (actinic keratosis) (9) Scalp x 7, R ear x 2  Destruction of lesion - Scalp x 7, R ear x 2 Complexity: simple   Destruction method: cryotherapy   Informed consent: discussed and consent obtained   Timeout:  patient name, date of birth, surgical site, and procedure verified Lesion destroyed using liquid nitrogen: Yes   Region frozen until ice ball extended beyond lesion: Yes   Outcome: patient tolerated procedure well with no complications   Post-procedure details: wound care instructions given     Actinic Damage - Severe, confluent actinic changes with pre-cancerous actinic keratoses  - Severe, chronic, not at goal, secondary to cumulative UV radiation exposure over time - diffuse scaly erythematous macules and papules with underlying dyspigmentation - Discussed Prescription "Field Treatment" for Severe, Chronic Confluent Actinic Changes with Pre-Cancerous Actinic Keratoses Field treatment involves treatment of an entire area of skin that has confluent Actinic Changes (Sun/ Ultraviolet light damage) and PreCancerous Actinic Keratoses by method of PhotoDynamic Therapy (PDT) and/or prescription  Topical Chemotherapy agents such as 5-fluorouracil, 5-fluorouracil/calcipotriene, and/or imiquimod.  The purpose is to decrease the number of clinically evident and subclinical PreCancerous lesions to prevent progression to development of skin cancer by chemically destroying early precancer changes that may or may not be visible.  It has been shown to reduce the risk of developing skin cancer in the treated area. As a result of treatment, redness, scaling, crusting, and open sores may occur during treatment course. One or more than one of these methods may be used and may have to be used several times to control, suppress and eliminate the PreCancerous changes. Discussed treatment course, expected reaction, and possible side effects. - Recommend daily broad spectrum sunscreen SPF 30+ to sun-exposed areas, reapply every 2 hours as needed.  - Staying in the shade or wearing long sleeves, sun glasses (UVA+UVB protection) and wide brim hats (4-inch brim around the entire circumference of the hat) are also recommended. - Call for new or changing lesions. - In one month repeat 5FU/Calcipotriene tx on the scalp and ears.  Return in about 8 months (around 09/24/2021) for TBSE - Hx MM, BCC's, atypical nevi, AK's .  Luther Redo, CMA, am acting as scribe for Sarina Ser, MD .  Documentation: I have reviewed the above documentation for accuracy and completeness, and I agree with the above.  Sarina Ser, MD

## 2021-01-23 NOTE — Patient Instructions (Signed)

## 2021-01-25 ENCOUNTER — Encounter: Payer: Self-pay | Admitting: Dermatology

## 2021-01-25 DIAGNOSIS — M654 Radial styloid tenosynovitis [de Quervain]: Secondary | ICD-10-CM | POA: Diagnosis not present

## 2021-01-25 DIAGNOSIS — M25632 Stiffness of left wrist, not elsewhere classified: Secondary | ICD-10-CM | POA: Diagnosis not present

## 2021-02-06 DIAGNOSIS — M25632 Stiffness of left wrist, not elsewhere classified: Secondary | ICD-10-CM | POA: Diagnosis not present

## 2021-02-06 DIAGNOSIS — M654 Radial styloid tenosynovitis [de Quervain]: Secondary | ICD-10-CM | POA: Diagnosis not present

## 2021-02-07 DIAGNOSIS — M9903 Segmental and somatic dysfunction of lumbar region: Secondary | ICD-10-CM | POA: Diagnosis not present

## 2021-02-07 DIAGNOSIS — M5033 Other cervical disc degeneration, cervicothoracic region: Secondary | ICD-10-CM | POA: Diagnosis not present

## 2021-02-07 DIAGNOSIS — M9901 Segmental and somatic dysfunction of cervical region: Secondary | ICD-10-CM | POA: Diagnosis not present

## 2021-02-07 DIAGNOSIS — M62830 Muscle spasm of back: Secondary | ICD-10-CM | POA: Diagnosis not present

## 2021-02-14 DIAGNOSIS — M25632 Stiffness of left wrist, not elsewhere classified: Secondary | ICD-10-CM | POA: Diagnosis not present

## 2021-02-14 DIAGNOSIS — M654 Radial styloid tenosynovitis [de Quervain]: Secondary | ICD-10-CM | POA: Diagnosis not present

## 2021-02-20 DIAGNOSIS — M654 Radial styloid tenosynovitis [de Quervain]: Secondary | ICD-10-CM | POA: Diagnosis not present

## 2021-02-20 DIAGNOSIS — M25632 Stiffness of left wrist, not elsewhere classified: Secondary | ICD-10-CM | POA: Diagnosis not present

## 2021-02-22 ENCOUNTER — Other Ambulatory Visit: Payer: Self-pay | Admitting: Family Medicine

## 2021-02-27 DIAGNOSIS — M654 Radial styloid tenosynovitis [de Quervain]: Secondary | ICD-10-CM | POA: Diagnosis not present

## 2021-02-27 DIAGNOSIS — M25632 Stiffness of left wrist, not elsewhere classified: Secondary | ICD-10-CM | POA: Diagnosis not present

## 2021-03-02 ENCOUNTER — Ambulatory Visit: Payer: Self-pay | Admitting: *Deleted

## 2021-03-02 NOTE — Telephone Encounter (Signed)
   Reason for Disposition . COVID-19 vaccine, Frequently Asked Questions (FAQs)  Answer Assessment - Initial Assessment Questions 1. MAIN CONCERN OR SYMPTOM:  "What is your main concern right now?" "What question do you have?" "What's the main symptom you're worried about?" (e.g., fever, pain, redness, swelling)     If recently had Covid when can get booster shot.   I let him know after the 10 day quarantine period as long as he is fever free for 24 hrs with the use of medication and his symptoms are resolving or gone. He also wanted to know how long he needed to wait before getting the next booster. I let him know 5 months from the lat booster.   He thanked me for answering his 2 questions.  2. VACCINE: "What vaccination did you receive?" (e.g., none; AstraZeneca, J&J, [place], other) "Is this your first, second shot, or booster?" (e.g., first, second, booster)     *No Answer* 3. SYMPTOM ONSET: "When did the *No Answer* begin?" (e.g., not relevant; hours, days)      *No Answer* 4. SYMPTOM SEVERITY: "How bad is it?"      *No Answer* 5. FEVER: "Is there a fever?" If Yes, ask: "What is it, how was it measured, and when did it start?"      *No Answer* 6. PAST REACTIONS: "Have you reacted to immunizations before?" If Yes, ask: "What happened?"     *No Answer* 7. OTHER SYMPTOMS: "Do you have any other symptoms?"     *No Answer*  Protocols used: CORONAVIRUS (COVID-19) VACCINE QUESTIONS AND REACTIONS-A-AH

## 2021-03-07 DIAGNOSIS — M62830 Muscle spasm of back: Secondary | ICD-10-CM | POA: Diagnosis not present

## 2021-03-07 DIAGNOSIS — M9903 Segmental and somatic dysfunction of lumbar region: Secondary | ICD-10-CM | POA: Diagnosis not present

## 2021-03-07 DIAGNOSIS — M654 Radial styloid tenosynovitis [de Quervain]: Secondary | ICD-10-CM | POA: Diagnosis not present

## 2021-03-07 DIAGNOSIS — M25632 Stiffness of left wrist, not elsewhere classified: Secondary | ICD-10-CM | POA: Diagnosis not present

## 2021-03-07 DIAGNOSIS — M5033 Other cervical disc degeneration, cervicothoracic region: Secondary | ICD-10-CM | POA: Diagnosis not present

## 2021-03-07 DIAGNOSIS — M9901 Segmental and somatic dysfunction of cervical region: Secondary | ICD-10-CM | POA: Diagnosis not present

## 2021-03-12 DIAGNOSIS — M654 Radial styloid tenosynovitis [de Quervain]: Secondary | ICD-10-CM | POA: Diagnosis not present

## 2021-03-12 DIAGNOSIS — M25632 Stiffness of left wrist, not elsewhere classified: Secondary | ICD-10-CM | POA: Diagnosis not present

## 2021-03-22 ENCOUNTER — Encounter: Payer: Self-pay | Admitting: Internal Medicine

## 2021-03-22 ENCOUNTER — Ambulatory Visit: Payer: Self-pay | Admitting: *Deleted

## 2021-03-22 ENCOUNTER — Ambulatory Visit (INDEPENDENT_AMBULATORY_CARE_PROVIDER_SITE_OTHER): Payer: Medicare HMO | Admitting: Internal Medicine

## 2021-03-22 ENCOUNTER — Other Ambulatory Visit: Payer: Self-pay

## 2021-03-22 VITALS — BP 135/85 | HR 59 | Temp 98.7°F | Ht 72.84 in | Wt 196.4 lb

## 2021-03-22 DIAGNOSIS — T7840XA Allergy, unspecified, initial encounter: Secondary | ICD-10-CM

## 2021-03-22 MED ORDER — FAMOTIDINE 10 MG PO TABS
10.0000 mg | ORAL_TABLET | Freq: Once | ORAL | Status: AC
  Administered 2021-03-22: 10 mg via ORAL

## 2021-03-22 MED ORDER — DIPHENHYDRAMINE HCL 25 MG PO CAPS
25.0000 mg | ORAL_CAPSULE | Freq: Once | ORAL | Status: AC
  Administered 2021-03-22: 25 mg via ORAL

## 2021-03-22 MED ORDER — METHYLPREDNISOLONE SODIUM SUCC 40 MG IJ SOLR
40.0000 mg | Freq: Once | INTRAMUSCULAR | Status: AC
  Administered 2021-03-22: 40 mg via INTRAMUSCULAR

## 2021-03-22 MED ORDER — FEXOFENADINE HCL 180 MG PO TABS: 180.0000 mg | ORAL_TABLET | Freq: Every day | ORAL | 1 refills | Status: DC

## 2021-03-22 MED ORDER — METHYLPREDNISOLONE 4 MG PO TBPK
ORAL_TABLET | ORAL | 0 refills | Status: DC
Start: 2021-03-22 — End: 2021-07-17

## 2021-03-22 NOTE — Progress Notes (Signed)
BP 135/85   Pulse (!) 59   Temp 98.7 F (37.1 C) (Oral)   Ht 6' 0.84" (1.85 m)   Wt 196 lb 6.4 oz (89.1 kg)   SpO2 99%   BMI 26.03 kg/m    Subjective:    Patient ID: [name], male    DOB: [birth year], 65 y.o.   MRN: [id number]  Chief Complaint  Patient presents with  . Red Swollen Eye    Started on Monday more left eye than right eye, states also itches on back of neck, side, and b/l arms. With some burning in eyes    HPI: [name] is a 65 y.o. male  Left belwo the eye swelling, started Monday Tuesday was mowing lawn and trimming shrubbery, didn't see any poison ivy / didn't rrealize any bugs bitting him. Fels some bumps on both eyelids. Towards crentre of the brudge of the enose was bumppy. Fels like welps on left side of his     Chief Complaint  Patient presents with  . Red Swollen Eye    Started on Monday more left eye than right eye, states also itches on back of neck, side, and b/l arms. With some burning in eyes    Relevant past medical, surgical, family and social history reviewed and updated as indicated. Interim medical history since our last visit reviewed. Allergies and medications reviewed and updated.  Review of Systems  Per HPI unless specifically indicated above     Objective:    BP 135/85   Pulse (!) 59   Temp 98.7 F (37.1 C) (Oral)   Ht 6' 0.84" (1.85 m)   Wt 196 lb 6.4 oz (89.1 kg)   SpO2 99%   BMI 26.03 kg/m   Wt Readings from Last 3 Encounters:  03/22/21 196 lb 6.4 oz (89.1 kg)  05/10/20 196 lb (88.9 kg)  06/08/19 195 lb (88.5 kg)    Physical Exam Constitutional:      Appearance: Normal appearance.  HENT:     Head:     Comments: Swelling noted below the left eye , areas of hives noted on neck , arms and lower ext as well as lateral chest wall     Nose: No congestion or rhinorrhea.  Eyes:     Extraocular Movements: Extraocular movements intact.     Conjunctiva/sclera: Conjunctivae normal.     Pupils: Pupils are  equal, round, and reactive to light.  Cardiovascular:     Rate and Rhythm: Normal rate and regular rhythm.  Pulmonary:     Effort: No respiratory distress.     Breath sounds: No stridor. No wheezing or rhonchi.  Chest:     Chest wall: No tenderness.  Skin:    Coloration: Skin is not jaundiced or pale.     Findings: Rash present. No bruising or lesion.  Neurological:     Mental Status: He is alert.         Current Outpatient Medications:  .  aspirin 81 MG tablet, Take by mouth., Disp: , Rfl:  .  levothyroxine (SYNTHROID) 50 MCG tablet, TAKE 1 TABLET BY MOUTH EVERY DAY, Disp: 90 tablet, Rfl: 0 .  Multiple Vitamins tablet, Take by mouth., Disp: , Rfl:  .  Multiple Vitamins-Minerals (PRESERVISION AREDS 2 PO), Take by mouth., Disp: , Rfl:  .  pantoprazole (PROTONIX) 20 MG tablet, Take 1 tablet by mouth daily., Disp: , Rfl: 0    Assessment & Plan:  ALLERGIC REACTION : ? To allergen  in the yard/ outdoors.  Rash on face neck arms and lower ext  Solumedrol x 120 mg x IM  1 administered today Will start pt on medrol dose pak for such  FAMOTIDINE 10 MG X 1 ADMINSITERED IN OFFICE PO  BENADRYL 25 MG X 1 ADMINSTERED IN OFFICE PO  Allegra x 180 mg q pm rx for pt  To fu with pcp x 1 week for above.  Advised to call the office or go to the ER if she develops worsening rashes,  shortness of breath , angioedema, toungue swellin, dizziness or tingling or numbness.  Pt verbalized understanding of such.  Problem List Items Addressed This Visit   None    No orders of the defined types were placed in this encounter.    Meds ordered this encounter  Medications  . methylPREDNISolone (MEDROL DOSEPAK) 4 MG TBPK tablet    Sig: Use as directed    Dispense:  1 each    Refill:  0  . fexofenadine (ALLEGRA ALLERGY) 180 MG tablet    Sig: Take 1 tablet (180 mg total) by mouth daily.    Dispense:  20 tablet    Refill:  1  . methylPREDNISolone sodium succinate (SOLU-MEDROL) 40 mg/mL injection 40 mg   . diphenhydrAMINE (BENADRYL) capsule 25 mg  . famotidine (PEPCID) tablet 10 mg     Follow up plan: No follow-ups on file.

## 2021-03-22 NOTE — Telephone Encounter (Signed)
Reason for Disposition  [1] Painful rash AND [2] multiple small blisters grouped together (i.e., dermatomal distribution or "band" or "stripe")  Answer Assessment - Initial Assessment Questions 1. ONSET: "When did the swelling start?" (e.g., minutes, hours, days)     Started Monday- redness under L eye 2. LOCATION: "What part of the eyelids is swollen?"     L eye 3. SEVERITY: "How swollen is it?"     Puffy under eye- this morning eye swollen-whole eye 4. ITCHING: "Is there any itching?" If Yes, ask: "How much?"   (Scale 1-10; mild, moderate or severe)     yes 5. PAIN: "Is the swelling painful to touch?" If Yes, ask: "How painful is it?"   (Scale 1-10; mild, moderate or severe)     Uncomfortable- burning 6. FEVER: "Do you have a fever?" If Yes, ask: "What is it, how was it measured, and when did it start?"      no 7. CAUSE: "What do you think is causing the swelling?"     unsure 8. RECURRENT SYMPTOM: "Have you had eyelid swelling before?" If Yes, ask: "When was the last time?" "What happened that time?"     no 9. OTHER SYMPTOMS: "Do you have any other symptoms?" (e.g., blurred vision, eye discharge, rash, runny nose)     Mall blisters, oozing, r-forearm, under arm blister 10. PREGNANCY: "Is there any chance you are pregnant?" "When was your last menstrual period?"       N/a  Protocols used: Eye - Swelling-A-AH

## 2021-03-22 NOTE — Telephone Encounter (Signed)
FYI....   Pt has an appointment with [facility] today.    Thanks,   -Mickel Baas

## 2021-03-22 NOTE — Telephone Encounter (Signed)
Patient calling to report that starting Monday he had Left eye irritation- is has become worse and now the eye is swollen and has small blisters underneath. Call to office-they are going to schedule.

## 2021-03-23 DIAGNOSIS — M25632 Stiffness of left wrist, not elsewhere classified: Secondary | ICD-10-CM | POA: Diagnosis not present

## 2021-03-23 DIAGNOSIS — M654 Radial styloid tenosynovitis [de Quervain]: Secondary | ICD-10-CM | POA: Diagnosis not present

## 2021-03-27 ENCOUNTER — Telehealth: Payer: Self-pay

## 2021-03-27 NOTE — Telephone Encounter (Signed)
Copied from [place] [id number]. Topic: General - Other >> Mar 27, 2021  2:23 PM Keene Breath wrote: Reason for CRM: Patient would like the nurse to call him regarding the 2nd booster vaccine  and the current medication he is taking.  Please advise and call to discuss at [phone]

## 2021-03-28 DIAGNOSIS — M25632 Stiffness of left wrist, not elsewhere classified: Secondary | ICD-10-CM | POA: Diagnosis not present

## 2021-03-28 DIAGNOSIS — M654 Radial styloid tenosynovitis [de Quervain]: Secondary | ICD-10-CM | POA: Diagnosis not present

## 2021-03-28 NOTE — Telephone Encounter (Signed)
Spoke to the patient, and Dr Rosanna Randy recommends that he wait 1-2 weeks to get covid booster. Patient just completed steroid taper due to insect bite this morning.

## 2021-04-04 DIAGNOSIS — M9903 Segmental and somatic dysfunction of lumbar region: Secondary | ICD-10-CM | POA: Diagnosis not present

## 2021-04-04 DIAGNOSIS — M62830 Muscle spasm of back: Secondary | ICD-10-CM | POA: Diagnosis not present

## 2021-04-04 DIAGNOSIS — M5033 Other cervical disc degeneration, cervicothoracic region: Secondary | ICD-10-CM | POA: Diagnosis not present

## 2021-04-04 DIAGNOSIS — M9901 Segmental and somatic dysfunction of cervical region: Secondary | ICD-10-CM | POA: Diagnosis not present

## 2021-04-17 DIAGNOSIS — H40013 Open angle with borderline findings, low risk, bilateral: Secondary | ICD-10-CM | POA: Diagnosis not present

## 2021-04-25 DIAGNOSIS — M654 Radial styloid tenosynovitis [de Quervain]: Secondary | ICD-10-CM | POA: Diagnosis not present

## 2021-04-25 DIAGNOSIS — M25632 Stiffness of left wrist, not elsewhere classified: Secondary | ICD-10-CM | POA: Diagnosis not present

## 2021-05-01 DIAGNOSIS — M62830 Muscle spasm of back: Secondary | ICD-10-CM | POA: Diagnosis not present

## 2021-05-01 DIAGNOSIS — M5033 Other cervical disc degeneration, cervicothoracic region: Secondary | ICD-10-CM | POA: Diagnosis not present

## 2021-05-01 DIAGNOSIS — M9903 Segmental and somatic dysfunction of lumbar region: Secondary | ICD-10-CM | POA: Diagnosis not present

## 2021-05-01 DIAGNOSIS — M9901 Segmental and somatic dysfunction of cervical region: Secondary | ICD-10-CM | POA: Diagnosis not present

## 2021-05-03 DIAGNOSIS — Z8719 Personal history of other diseases of the digestive system: Secondary | ICD-10-CM | POA: Diagnosis not present

## 2021-05-03 DIAGNOSIS — K219 Gastro-esophageal reflux disease without esophagitis: Secondary | ICD-10-CM | POA: Diagnosis not present

## 2021-05-15 ENCOUNTER — Other Ambulatory Visit: Payer: Self-pay

## 2021-05-15 ENCOUNTER — Encounter: Payer: Self-pay | Admitting: Family Medicine

## 2021-05-15 ENCOUNTER — Ambulatory Visit (INDEPENDENT_AMBULATORY_CARE_PROVIDER_SITE_OTHER): Payer: Medicare HMO | Admitting: Family Medicine

## 2021-05-15 VITALS — BP 120/81 | HR 80 | Temp 98.9°F | Resp 16 | Ht 73.0 in | Wt 195.0 lb

## 2021-05-15 DIAGNOSIS — Z289 Immunization not carried out for unspecified reason: Secondary | ICD-10-CM | POA: Diagnosis not present

## 2021-05-15 DIAGNOSIS — Z125 Encounter for screening for malignant neoplasm of prostate: Secondary | ICD-10-CM

## 2021-05-15 DIAGNOSIS — E039 Hypothyroidism, unspecified: Secondary | ICD-10-CM | POA: Diagnosis not present

## 2021-05-15 DIAGNOSIS — E7800 Pure hypercholesterolemia, unspecified: Secondary | ICD-10-CM

## 2021-05-15 DIAGNOSIS — Z0000 Encounter for general adult medical examination without abnormal findings: Secondary | ICD-10-CM

## 2021-05-15 MED ORDER — SHINGRIX 50 MCG/0.5ML IM SUSR: 0.5000 mL | Freq: Once | INTRAMUSCULAR | 0 refills | Status: AC

## 2021-05-15 NOTE — Progress Notes (Signed)
Annual Wellness Visit     Patient: [name], Male    DOB: Jun 15, 1956, 65 y.o.   MRN: [id number] Visit Date: 05/15/2021  Today's Provider: Wilhemena Durie, MD   Chief Complaint  Patient presents with   Annual Exam   Subjective    [name] is a 65 y.o. male who presents today for his Annual Wellness Visit.Annual Physical. He reports consuming a general diet. Home exercise routine includes walking. He generally feels well. He reports sleeping well. He does not have additional problems to discuss today.        Medications: Outpatient Medications Prior to Visit  Medication Sig   aspirin 81 MG tablet Take by mouth.   levothyroxine (SYNTHROID) 50 MCG tablet TAKE 1 TABLET BY MOUTH EVERY DAY   Multiple Vitamins tablet Take by mouth.   Multiple Vitamins-Minerals (PRESERVISION AREDS 2 PO) Take by mouth.   pantoprazole (PROTONIX) 20 MG tablet Take 1 tablet by mouth daily.   fexofenadine (ALLEGRA ALLERGY) 180 MG tablet Take 1 tablet (180 mg total) by mouth daily. (Patient not taking: Reported on 05/15/2021)   methylPREDNISolone (MEDROL DOSEPAK) 4 MG TBPK tablet Use as directed   No facility-administered medications prior to visit.    No Known Allergies  Patient Care Team: Jerrol Banana., MD as PCP - General (Family Medicine)  Review of Systems  All other systems reviewed and are negative.       Objective    Vitals: BP 120/81   Pulse 80   Temp 98.9 F (37.2 C)   Resp 16   Ht '6\' 1"'$  (1.854 m)   Wt 195 lb (88.5 kg)   BMI 25.73 kg/m  BP Readings from Last 3 Encounters:  05/15/21 120/81  03/22/21 135/85  05/10/20 (!) 164/89   Wt Readings from Last 3 Encounters:  05/15/21 195 lb (88.5 kg)  03/22/21 196 lb 6.4 oz (89.1 kg)  05/10/20 196 lb (88.9 kg)      Physical Exam Vitals reviewed.  Constitutional:      Appearance: Normal appearance. He is normal weight.  HENT:     Head: Normocephalic and atraumatic.     Right Ear: Tympanic  membrane, ear canal and external ear normal.     Left Ear: Tympanic membrane, ear canal and external ear normal.     Nose: Nose normal.     Mouth/Throat:     Mouth: Mucous membranes are moist.     Pharynx: Oropharynx is clear.  Eyes:     Extraocular Movements: Extraocular movements intact.     Conjunctiva/sclera: Conjunctivae normal.     Pupils: Pupils are equal, round, and reactive to light.  Cardiovascular:     Rate and Rhythm: Normal rate and regular rhythm.     Pulses: Normal pulses.     Heart sounds: Normal heart sounds.  Pulmonary:     Effort: Pulmonary effort is normal.     Breath sounds: Normal breath sounds.  Abdominal:     General: Abdomen is flat. Bowel sounds are normal.     Palpations: Abdomen is soft.  Genitourinary:    Penis: Normal.      Testes: Normal.  Musculoskeletal:     Cervical back: Normal range of motion and neck supple.     Right lower leg: No edema.     Left lower leg: No edema.  Skin:    General: Skin is warm and dry.     Comments: Dark tan  Neurological:  General: No focal deficit present.     Mental Status: He is alert and oriented to person, place, and time. Mental status is at baseline.  Psychiatric:        Mood and Affect: Mood normal.        Behavior: Behavior normal.        Thought Content: Thought content normal.        Judgment: Judgment normal.     Most recent functional status assessment: In your present state of health, do you have any difficulty performing the following activities: 05/15/2021  Hearing? N  Vision? N  Difficulty concentrating or making decisions? N  Walking or climbing stairs? N  Dressing or bathing? N  Doing errands, shopping? N  Some recent data might be hidden   Most recent fall risk assessment: Fall Risk  03/22/2021  Falls in the past year? 0  Number falls in past yr: 0  Injury with Fall? 0  Risk for fall due to : No Fall Risks  Follow up Falls evaluation completed    Most recent depression  screenings: PHQ 2/9 Scores 03/22/2021 05/10/2020  PHQ - 2 Score 0 0  PHQ- 9 Score - 0   Most recent cognitive screening: 6CIT Screen 05/15/2021  What Year? 0 points  What month? 0 points  What time? 0 points  Count back from 20 0 points  Months in reverse 0 points  Repeat phrase 0 points  Total Score 0   Most recent Audit-C alcohol use screening Alcohol Use Disorder Test (AUDIT) 05/15/2021  1. How often do you have a drink containing alcohol? 0  2. How many drinks containing alcohol do you have on a typical day when you are drinking? 0  3. How often do you have six or more drinks on one occasion? 0  AUDIT-C Score 0  4. How often during the last year have you found that you were not able to stop drinking once you had started? -  5. How often during the last year have you failed to do what was normally expected from you because of drinking? -  6. How often during the last year have you needed a first drink in the morning to get yourself going after a heavy drinking session? -  7. How often during the last year have you had a feeling of guilt of remorse after drinking? -  8. How often during the last year have you been unable to remember what happened the night before because you had been drinking? -  9. Have you or someone else been injured as a result of your drinking? -  10. Has a relative or friend or a doctor or another health worker been concerned about your drinking or suggested you cut down? -  Alcohol Use Disorder Identification Test Final Score (AUDIT) -  Alcohol Brief Interventions/Follow-up -   A score of 3 or more in women, and 4 or more in men indicates increased risk for alcohol abuse, EXCEPT if all of the points are from question 1   No results found for any visits on 05/15/21.  Assessment & Plan     Annual wellness visit done today including the all of the following: Reviewed patient's Family Medical History Reviewed and updated list of patient's medical  providers Assessment of cognitive impairment was done Assessed patient's functional ability Established a written schedule for health screening [place] Completed and Reviewed  Exercise Activities and Dietary recommendations  Goals   None  Immunization History  Administered Date(s) Administered   Fluad Quad(high Dose 65+) 06/08/2019   Influenza,inj,Quad PF,6+ Mos 08/01/2016, 07/15/2018   Influenza,inj,Quad PF,6-35 Mos 05/29/2017   Influenza-Unspecified 08/07/2015   Pneumococcal Polysaccharide-23 05/10/2019   Td 10/29/2016   Tdap 12/01/2006   Zoster, Live 10/19/2014    Health Maintenance  Topic Date Due   COVID-19 Vaccine (1) Never done   INFLUENZA VACCINE  05/07/2021   Zoster Vaccines- Shingrix (1 of 2) 06/22/2021 (Originally [birth year]/25/1974)   PNA vac Low Risk Adult (2 of 2 - PCV13) 03/22/2022 (Originally 05/09/2020)   TETANUS/TDAP  10/29/2026   COLONOSCOPY (Pts 45-26yr Insurance coverage will need to be confirmed)  01/21/2027   Hepatitis C Screening  Completed   HPV VACCINES  Aged Out   1. Medicare annual wellness visit, subsequent   2. Annual physical exam   3. Pure hypercholesterolemia  - Lipid panel - Comprehensive Metabolic Panel (CMET)  4. Adult hypothyroidism  - TSH - CBC w/Diff/Platelet  5. Prescription for Shingrix. Vaccine not administered in office.   - Zoster Vaccine Adjuvanted ([facility] injection; Inject 0.5 mLs into the muscle once for 1 dose.  Dispense: 0.5 mL; Refill: 0  6. Screening for prostate cancer  - PSA   Discussed health benefits of physical activity, and encouraged him to engage in regular exercise appropriate for his age and condition.    No follow-ups on file.     I, RWilhemena Durie MD, have reviewed all documentation for this visit. The documentation on 05/21/21 for the exam, diagnosis, procedures, and orders are all accurate and complete.    Rembert Browe GCranford Mon MD  [phone](phone) [phone](fax)  [place]

## 2021-05-16 LAB — COMPREHENSIVE METABOLIC PANEL
ALT: 17 IU/L (ref 0–44)
AST: 21 IU/L (ref 0–40)
Albumin/Globulin Ratio: 2.3 — ABNORMAL HIGH (ref 1.2–2.2)
Albumin: 4.5 g/dL (ref 3.8–4.8)
Alkaline Phosphatase: 58 IU/L (ref 44–121)
BUN/Creatinine Ratio: 14 (ref 10–24)
BUN: 14 mg/dL (ref 8–27)
Bilirubin Total: 0.4 mg/dL (ref 0.0–1.2)
CO2: 26 mmol/L (ref 20–29)
Calcium: 9.5 mg/dL (ref 8.6–10.2)
Chloride: 103 mmol/L (ref 96–106)
Creatinine, Ser: 1.03 mg/dL (ref 0.76–1.27)
Globulin, Total: 2 g/dL (ref 1.5–4.5)
Glucose: 92 mg/dL (ref 65–99)
Potassium: 4.9 mmol/L (ref 3.5–5.2)
Sodium: 142 mmol/L (ref 134–144)
Total Protein: 6.5 g/dL (ref 6.0–8.5)
eGFR: 80 mL/min/{1.73_m2} (ref >59)

## 2021-05-16 LAB — CBC WITH DIFFERENTIAL/PLATELET
Basophils Absolute: 0.1 10*3/uL (ref 0.0–0.2)
Basos: 1 %
EOS (ABSOLUTE): 0.1 10*3/uL (ref 0.0–0.4)
Eos: 3 %
Hematocrit: 44.4 % (ref 37.5–51.0)
Hemoglobin: 15.4 g/dL (ref 13.0–17.7)
Immature Grans (Abs): 0 10*3/uL (ref 0.0–0.1)
Immature Granulocytes: 1 %
Lymphocytes Absolute: 1.7 10*3/uL (ref 0.7–3.1)
Lymphs: 32 %
MCH: 32.2 pg (ref 26.6–33.0)
MCHC: 34.7 g/dL (ref 31.5–35.7)
MCV: 93 fL (ref 79–97)
Monocytes Absolute: 0.4 10*3/uL (ref 0.1–0.9)
Monocytes: 8 %
Neutrophils Absolute: 2.9 10*3/uL (ref 1.4–7.0)
Neutrophils: 55 %
Platelets: 232 10*3/uL (ref 150–450)
RBC: 4.79 x10E6/uL (ref 4.14–5.80)
RDW: 12.5 % (ref 11.6–15.4)
WBC: 5.2 10*3/uL (ref 3.4–10.8)

## 2021-05-16 LAB — LIPID PANEL
Chol/HDL Ratio: 3.6 ratio (ref 0.0–5.0)
Cholesterol, Total: 223 mg/dL — ABNORMAL HIGH (ref 100–199)
HDL: 62 mg/dL (ref >39)
LDL Chol Calc (NIH): 144 mg/dL — ABNORMAL HIGH (ref 0–99)
Triglycerides: 98 mg/dL (ref 0–149)
VLDL Cholesterol Cal: 17 mg/dL (ref 5–40)

## 2021-05-16 LAB — TSH: TSH: 2.99 u[IU]/mL (ref 0.450–4.500)

## 2021-05-16 LAB — PSA: Prostate Specific Ag, Serum: 2.2 ng/mL (ref 0.0–4.0)

## 2021-05-22 ENCOUNTER — Other Ambulatory Visit: Payer: Self-pay | Admitting: Family Medicine

## 2021-05-29 DIAGNOSIS — M5033 Other cervical disc degeneration, cervicothoracic region: Secondary | ICD-10-CM | POA: Diagnosis not present

## 2021-05-29 DIAGNOSIS — M9901 Segmental and somatic dysfunction of cervical region: Secondary | ICD-10-CM | POA: Diagnosis not present

## 2021-05-29 DIAGNOSIS — M9903 Segmental and somatic dysfunction of lumbar region: Secondary | ICD-10-CM | POA: Diagnosis not present

## 2021-05-29 DIAGNOSIS — M62830 Muscle spasm of back: Secondary | ICD-10-CM | POA: Diagnosis not present

## 2021-06-25 ENCOUNTER — Ambulatory Visit: Payer: Self-pay | Admitting: *Deleted

## 2021-06-25 NOTE — Telephone Encounter (Signed)
Pt reports tenderness left sided rib area x 1 week. Over weekend noted lump left axilla, under skin, not visible, quarter size, tender with palpation. Denies fever, states "Been feeling achy all over." Called practice, [facility], appt secured for tomorrow at 1pm. Pt aware, care advise given, pt verbalizes understanding.

## 2021-06-25 NOTE — Telephone Encounter (Signed)
FYI. Please review. Thanks!  

## 2021-06-25 NOTE — Telephone Encounter (Signed)
Patient called in to get an appointment from Dr Rosanna Randy say that he have some pain and discomfort on his left side from under arm down but noticed a lump over the weekend and is concerned. Please call patient at  Ph# [phone]  Reason for Disposition  [1] Swelling is painful to touch AND [2] no fever  Answer Assessment - Initial Assessment Questions 1. APPEARANCE of SWELLING: "What does it look like?" (e.g., lymph node, insect bite, mole)     Lump is quarter size 2. SIZE: "How large is the swelling?" (e.g., inches, cm; or compare to size of pinhead, tip of pen, eraser, coin, pea, grape, ping pong ball)      Quarter size 3. LOCATION: "Where is the swelling located?"     Left armpit 4. ONSET: "When did the swelling start?"     Noted over weekend 5. PAIN: "Is it painful?" If Yes, ask: "How much?"     Tender when palpating 6. ITCH: "Does it itch?" If Yes, ask: "How much?"     no 7. CAUSE: "What do you think caused the swelling?"     no 8. OTHER SYMPTOMS: "Do you have any other symptoms?" (e.g., fever)     no  Protocols used: Skin Lump or Localized Swelling-A-AH

## 2021-06-26 ENCOUNTER — Ambulatory Visit
Admission: RE | Admit: 2021-06-26 | Discharge: 2021-06-26 | Disposition: A | Discharge status: Home / Self Care | Payer: Medicare HMO | Source: Ambulatory Visit | Attending: Family Medicine | Admitting: Family Medicine

## 2021-06-26 ENCOUNTER — Ambulatory Visit
Admission: RE | Admit: 2021-06-26 | Discharge: 2021-06-26 | Disposition: A | Discharge status: Home / Self Care | Payer: Medicare HMO | Attending: Family Medicine | Admitting: Family Medicine

## 2021-06-26 ENCOUNTER — Encounter: Payer: Self-pay | Admitting: Family Medicine

## 2021-06-26 ENCOUNTER — Ambulatory Visit (INDEPENDENT_AMBULATORY_CARE_PROVIDER_SITE_OTHER): Payer: Medicare HMO | Admitting: Family Medicine

## 2021-06-26 ENCOUNTER — Other Ambulatory Visit: Payer: Self-pay

## 2021-06-26 VITALS — BP 139/84 | HR 71 | Resp 16 | Wt 194.0 lb

## 2021-06-26 DIAGNOSIS — M5033 Other cervical disc degeneration, cervicothoracic region: Secondary | ICD-10-CM | POA: Diagnosis not present

## 2021-06-26 DIAGNOSIS — M62830 Muscle spasm of back: Secondary | ICD-10-CM | POA: Diagnosis not present

## 2021-06-26 DIAGNOSIS — R059 Cough, unspecified: Secondary | ICD-10-CM | POA: Diagnosis not present

## 2021-06-26 DIAGNOSIS — M9901 Segmental and somatic dysfunction of cervical region: Secondary | ICD-10-CM | POA: Diagnosis not present

## 2021-06-26 DIAGNOSIS — M9903 Segmental and somatic dysfunction of lumbar region: Secondary | ICD-10-CM | POA: Diagnosis not present

## 2021-06-26 DIAGNOSIS — R079 Chest pain, unspecified: Secondary | ICD-10-CM

## 2021-06-26 DIAGNOSIS — R590 Localized enlarged lymph nodes: Secondary | ICD-10-CM | POA: Diagnosis not present

## 2021-06-26 MED ORDER — DOXYCYCLINE HYCLATE 100 MG PO TABS: 100.0000 mg | ORAL_TABLET | Freq: Two times a day (BID) | ORAL | 0 refills | Status: AC

## 2021-06-26 NOTE — Progress Notes (Signed)
Established patient visit   Patient: [name]   DOB: 02/18/1956   65 y.o. Male  MRN: [id number] Visit Date: 06/26/2021  Today's healthcare provider: Wilhemena Durie, MD   Chief Complaint  Patient presents with   lump under arm    Subjective    HPI  Patient presents today c/o lump under his left arm. Patient reports that he first noticed it on 06/16/21. He reports that the area is very tender.  Has no systemic symptoms and vrery mild cough.  He has no rash.  BP Readings from Last 3 Encounters:  06/26/21 139/84  05/15/21 120/81  03/22/21 135/85   Wt Readings from Last 3 Encounters:  06/26/21 194 lb (88 kg)  05/15/21 195 lb (88.5 kg)  03/22/21 196 lb 6.4 oz (89.1 kg)       Medications: Outpatient Medications Prior to Visit  Medication Sig   aspirin 81 MG tablet Take by mouth.   levothyroxine (SYNTHROID) 50 MCG tablet TAKE 1 TABLET BY MOUTH EVERY DAY   methylPREDNISolone (MEDROL DOSEPAK) 4 MG TBPK tablet Use as directed   Multiple Vitamins tablet Take by mouth.   Multiple Vitamins-Minerals (PRESERVISION AREDS 2 PO) Take by mouth.   pantoprazole (PROTONIX) 20 MG tablet Take 1 tablet by mouth daily.   fexofenadine (ALLEGRA ALLERGY) 180 MG tablet Take 1 tablet (180 mg total) by mouth daily. (Patient not taking: No sig reported)   No facility-administered medications prior to visit.    Review of Systems  Constitutional:  Negative for activity change and fatigue.  Respiratory:  Negative for cough and shortness of breath.   Cardiovascular:  Negative for chest pain, palpitations and leg swelling.  Neurological:  Negative for dizziness and headaches.  Hematological:  Negative for adenopathy. Does not bruise/bleed easily.       Objective    BP 139/84   Pulse 71   Resp 16   Wt 194 lb (88 kg)   BMI 25.60 kg/m  BP Readings from Last 3 Encounters:  06/26/21 139/84  05/15/21 120/81  03/22/21 135/85   Wt Readings from Last 3 Encounters:  06/26/21 194  lb (88 kg)  05/15/21 195 lb (88.5 kg)  03/22/21 196 lb 6.4 oz (89.1 kg)      Physical Exam Vitals reviewed.  Constitutional:      Appearance: Normal appearance.  Cardiovascular:     Rate and Rhythm: Regular rhythm.     Heart sounds: Normal heart sounds.  Pulmonary:     Effort: Pulmonary effort is normal.     Breath sounds: Normal breath sounds.  Musculoskeletal:     Comments: He has minimal tenderness along the chest wall just below the left axilla but has a mobile but firm axillary lymph node is about 3 cm in size.  Neurological:     Mental Status: He is alert.  Psychiatric:        Mood and Affect: Mood normal.        Behavior: Behavior normal.        Thought Content: Thought content normal.        Judgment: Judgment normal.      No results found for any visits on 06/26/21.  Assessment & Plan     1. Cough Chest x-ray. - DG Chest 2 View; Future - doxycycline (VIBRA-TABS) 100 MG tablet; Take 1 tablet (100 mg total) by mouth 2 (two) times daily for 7 days.  Dispense: 14 tablet; Refill: 0 - CBC with Differential/Platelet -  Comprehensive metabolic panel  2. Axillary adenopathy No history of cancer that is worrisome.  Refer for sideration of axillary node biopsy Treat this now as a lymphadenitis.  Treat with doxycycline. - Ambulatory referral to General Surgery  3. Chest pain, unspecified type Chest wall pain.   No follow-ups on file.      I, Wilhemena Durie, MD, have reviewed all documentation for this visit. The documentation on 06/30/21 for the exam, diagnosis, procedures, and orders are all accurate and complete.    Herschell Virani Cranford Mon, MD  [facility] [phone] (phone) [phone] (fax)  [employer]

## 2021-06-27 LAB — COMPREHENSIVE METABOLIC PANEL
ALT: 15 IU/L (ref 0–44)
AST: 17 IU/L (ref 0–40)
Albumin/Globulin Ratio: 2.2 (ref 1.2–2.2)
Albumin: 4.6 g/dL (ref 3.8–4.8)
Alkaline Phosphatase: 62 IU/L (ref 44–121)
BUN/Creatinine Ratio: 15 (ref 10–24)
BUN: 16 mg/dL (ref 8–27)
Bilirubin Total: 0.4 mg/dL (ref 0.0–1.2)
CO2: 26 mmol/L (ref 20–29)
Calcium: 9.9 mg/dL (ref 8.6–10.2)
Chloride: 102 mmol/L (ref 96–106)
Creatinine, Ser: 1.06 mg/dL (ref 0.76–1.27)
Globulin, Total: 2.1 g/dL (ref 1.5–4.5)
Glucose: 86 mg/dL (ref 65–99)
Potassium: 4.5 mmol/L (ref 3.5–5.2)
Sodium: 145 mmol/L — ABNORMAL HIGH (ref 134–144)
Total Protein: 6.7 g/dL (ref 6.0–8.5)
eGFR: 77 mL/min/{1.73_m2} (ref >59)

## 2021-06-27 LAB — CBC WITH DIFFERENTIAL/PLATELET
Basophils Absolute: 0 10*3/uL (ref 0.0–0.2)
Basos: 1 %
EOS (ABSOLUTE): 0.1 10*3/uL (ref 0.0–0.4)
Eos: 2 %
Hematocrit: 45.1 % (ref 37.5–51.0)
Hemoglobin: 14.9 g/dL (ref 13.0–17.7)
Immature Grans (Abs): 0 10*3/uL (ref 0.0–0.1)
Immature Granulocytes: 1 %
Lymphocytes Absolute: 1.5 10*3/uL (ref 0.7–3.1)
Lymphs: 30 %
MCH: 31.1 pg (ref 26.6–33.0)
MCHC: 33 g/dL (ref 31.5–35.7)
MCV: 94 fL (ref 79–97)
Monocytes Absolute: 0.4 10*3/uL (ref 0.1–0.9)
Monocytes: 8 %
Neutrophils Absolute: 3.1 10*3/uL (ref 1.4–7.0)
Neutrophils: 58 %
Platelets: 258 10*3/uL (ref 150–450)
RBC: 4.79 x10E6/uL (ref 4.14–5.80)
RDW: 12.7 % (ref 11.6–15.4)
WBC: 5.2 10*3/uL (ref 3.4–10.8)

## 2021-06-28 ENCOUNTER — Telehealth: Payer: Self-pay | Admitting: *Deleted

## 2021-06-28 NOTE — Telephone Encounter (Signed)
Patient returned call for imaging results- notified: Chest x-ray normal. . Please advise patient.

## 2021-07-07 DIAGNOSIS — C439 Malignant melanoma of skin, unspecified: Secondary | ICD-10-CM

## 2021-07-07 HISTORY — DX: Malignant melanoma of skin, unspecified: C43.9

## 2021-07-10 ENCOUNTER — Other Ambulatory Visit: Payer: Self-pay | Admitting: General Surgery

## 2021-07-10 DIAGNOSIS — C439 Malignant melanoma of skin, unspecified: Secondary | ICD-10-CM

## 2021-07-10 DIAGNOSIS — Z7689 Persons encountering health services in other specified circumstances: Secondary | ICD-10-CM | POA: Diagnosis not present

## 2021-07-10 DIAGNOSIS — C773 Secondary and unspecified malignant neoplasm of axilla and upper limb lymph nodes: Secondary | ICD-10-CM | POA: Diagnosis not present

## 2021-07-12 ENCOUNTER — Telehealth: Payer: Self-pay

## 2021-07-12 ENCOUNTER — Other Ambulatory Visit: Payer: Self-pay | Admitting: General Surgery

## 2021-07-12 ENCOUNTER — Other Ambulatory Visit: Payer: Self-pay | Admitting: Pathology

## 2021-07-12 LAB — SURGICAL PATHOLOGY

## 2021-07-12 NOTE — Telephone Encounter (Signed)
Copied from [place] [id number]. Topic: General - Other >> Jul 12, 2021  1:04 PM Lennox Solders wrote: Reason for CRM: Pt is calling and would like dr Rosanna Randy recommendation if he should get covid vaccine and flu shot due to his recent DX

## 2021-07-13 ENCOUNTER — Telehealth: Payer: Self-pay

## 2021-07-13 NOTE — Telephone Encounter (Signed)
Copied from [place] [id number]. Topic: General - Other >> Jul 13, 2021  3:34 PM [name] A wrote: Reason for CRM: The patient would like to be contacted by a member of Dr. Alben Spittle staff regarding vaccinations and the flu shot  The patient has recently been diagnosed with Melanoma and is uncertain of whether or not they should get further vaccinated  Please contact further when available

## 2021-07-16 NOTE — Telephone Encounter (Signed)
See other message with providers reply.

## 2021-07-17 ENCOUNTER — Inpatient Hospital Stay: Payer: Medicare HMO

## 2021-07-17 ENCOUNTER — Other Ambulatory Visit: Payer: Self-pay

## 2021-07-17 ENCOUNTER — Encounter: Payer: Self-pay | Admitting: Internal Medicine

## 2021-07-17 ENCOUNTER — Inpatient Hospital Stay: Payer: Medicare HMO | Attending: Internal Medicine | Admitting: Internal Medicine

## 2021-07-17 VITALS — BP 137/80 | HR 82 | Temp 98.3°F | Resp 18 | Wt 193.4 lb

## 2021-07-17 DIAGNOSIS — R519 Headache, unspecified: Secondary | ICD-10-CM | POA: Diagnosis not present

## 2021-07-17 DIAGNOSIS — Z801 Family history of malignant neoplasm of trachea, bronchus and lung: Secondary | ICD-10-CM | POA: Diagnosis not present

## 2021-07-17 DIAGNOSIS — C439 Malignant melanoma of skin, unspecified: Secondary | ICD-10-CM | POA: Diagnosis not present

## 2021-07-17 DIAGNOSIS — Z23 Encounter for immunization: Secondary | ICD-10-CM

## 2021-07-17 DIAGNOSIS — K7689 Other specified diseases of liver: Secondary | ICD-10-CM | POA: Diagnosis not present

## 2021-07-17 DIAGNOSIS — C779 Secondary and unspecified malignant neoplasm of lymph node, unspecified: Secondary | ICD-10-CM | POA: Insufficient documentation

## 2021-07-17 DIAGNOSIS — D7389 Other diseases of spleen: Secondary | ICD-10-CM | POA: Diagnosis not present

## 2021-07-17 MED ORDER — INFLUENZA VAC A&B SA ADJ QUAD 0.5 ML IM PRSY
0.5000 mL | PREFILLED_SYRINGE | Freq: Once | INTRAMUSCULAR | Status: AC
  Administered 2021-07-17: 0.5 mL via INTRAMUSCULAR
  Filled 2021-07-17: qty 0.5

## 2021-07-17 NOTE — Telephone Encounter (Signed)
Pt advised.   Thanks,   -Angelicia Lessner  

## 2021-07-17 NOTE — Progress Notes (Signed)
[facility] OFFICE PROGRESS NOTE  Patient Care Team: Tyler Banana., MD as PCP - General [facility] Medicine)  Cancer Staging No matching staging information was found for the patient.   Oncology History Overview Note  #  2015- left collar bone [Dr.Kowalski]- s/p resection- 0.65 mm  #  A. 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   There is sufficient material for ancillary molecular testing if needed  (block A3, A1, A2).   Comment:  Per provided outside pathology report the patient had an "at least pT1b"  malignant melanoma of the left lateral infraclavicular skin in 2015.  Touch preparations demonstrate predominantly discohesive large  pleomorphic cells, some with binucleation.  HE sections demonstrate  lymph node parenchyma involved by metastatic neoplasm.  The neoplastic  cells are pleomorphic with binucleation, focal intranuclear inclusions,  and associated tumor necrosis. The neoplastic cells demonstrate the  following pattern of immunoreactivity:  S100: Positive  SOX10: Positive  HMB-45: Negative  Melan-A: Negative  Super pancytokeratin: Negative  CD45: Negative  This pattern of immunoreactivity supports the above diagnosis.      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  male pleasant patient above history of recurrent melanoma has been has been referred to [place] evaluation recommendations.  Patient had a history of melanoma-left collarbone in 2015 status post excision.   Month ago noted to have swelling and discomfort.  He was evaluated by Dr. Chong Sicilian core biopsy.  Complains of headaches-fairly new onset.  Review of Systems  Constitutional:  Negative for chills, diaphoresis, fever, malaise/fatigue and weight loss.  HENT:  Negative for nosebleeds and sore throat.    Eyes:  Negative for double vision.  Respiratory:  Positive for cough. Negative for hemoptysis, sputum production, shortness of breath and wheezing.   Cardiovascular:  Negative for chest pain, palpitations, orthopnea and leg swelling.  Gastrointestinal:  Negative for abdominal pain, blood in stool, constipation, diarrhea, heartburn, melena, nausea and vomiting.  Genitourinary:  Negative for dysuria, frequency and urgency.  Musculoskeletal:  Negative for back pain and joint pain.  Skin: Negative.  Negative for itching and rash.  Neurological:  Positive for headaches. Negative for dizziness, tingling, focal weakness and weakness.  Endo/Heme/Allergies:  Does not bruise/bleed easily.  Psychiatric/Behavioral:  Negative for depression. The patient is not nervous/anxious and does not have insomnia.      PAST MEDICAL HISTORY :  Past Medical History:  Diagnosis Date   Basal cell carcinoma 03/10/2013   Right medial forehead. Excised, margins free.   Dysplastic nevus 11/05/2006   Mid back paraspinal, [name], close to edge. Excised 12/24/2006, margins free.   Dysplastic nevus 12/15/2008   Left lat. pectoral area. Moderate atypia, extends to one edge.    Dysplastic nevus 12/07/2009   Left mid side. Moderate atypia, close to margin.   Dysplastic nevus 12/07/2009   Right posterior waistline. Moderate atypia, close to margin.   Dysplastic nevus 02/10/2014   Left paraspinal mid back. Moderate atypia, lateral and deep margin involved.    Dysplastic nevus 06/16/2014   Left epigastric. Mild atypia, deep margin involved.    GERD (gastroesophageal reflux disease)    Hx of basal cell carcinoma    multiple sites   Hx of malignant melanoma 11/11/2013   L lateral infraclavicular, superficial  spreading, Breslow's 0.20mm, Clark's level III   Hypothyroidism    Melanoma ([place]) 11/11/2013   Left lateral infraclavicular. MM, superficial spreading. Anatomic level III. Tumor  thickness 0.67mm. Excised 11/24/2013, margins free.     PAST SURGICAL HISTORY :   Past Surgical History:  Procedure Laterality Date   COLONOSCOPY WITH PROPOFOL N/A 01/20/2017   Procedure: COLONOSCOPY WITH PROPOFOL;  Surgeon: Lollie Sails, MD;  Location: [facility] ENDOSCOPY;  Service: Endoscopy;  [name];   ESOPHAGOGASTRODUODENOSCOPY (EGD) WITH ESOPHAGEAL DILATION     MELANOMA EXCISION Right 11/24/2013   lateral infraclavicular   WISDOM TOOTH EXTRACTION      FAMILY HISTORY :   Family History  Problem Relation Age of Onset   Macular degeneration Mother    Hyperlipidemia Mother    Heart disease Father    Heart attack Father    Lung cancer Father        metastasized    SOCIAL HISTORY:   Social History   Tobacco Use   Smoking status: Never   Smokeless tobacco: Never  Substance Use Topics   Alcohol use: Yes    Comment: about 1 glass of wine with dinner   Drug use: No    ALLERGIES:  has No Known Allergies.  MEDICATIONS:  Current Outpatient Medications  Medication Sig Dispense Refill   aspirin 81 MG tablet Take by mouth.     levothyroxine (SYNTHROID) 50 MCG tablet TAKE 1 TABLET BY MOUTH EVERY DAY 90 tablet 0   Multiple Vitamins tablet Take by mouth.     Multiple Vitamins-Minerals (PRESERVISION AREDS 2 PO) Take by mouth.     pantoprazole (PROTONIX) 20 MG tablet Take 1 tablet by mouth daily.  0   No current facility-administered medications for this visit.    PHYSICAL EXAMINATION: ECOG PERFORMANCE STATUS: 0 - Asymptomatic  BP 137/80   Pulse 82   Temp 98.3 F (36.8 C)   Resp 18   Wt 193 lb 6.4 oz (87.7 kg)   SpO2 96%   BMI 25.52 kg/m   Filed Weights   07/17/21 1417  Weight: 193 lb 6.4 oz (87.7 kg)    Physical Exam Vitals and nursing note reviewed.  HENT:     Head: Normocephalic and atraumatic.     Mouth/Throat:     Pharynx: Oropharynx is clear.  Eyes:     Extraocular Movements: Extraocular movements intact.     Pupils: Pupils are equal, round,  and reactive to light.  Cardiovascular:     Rate and Rhythm: Normal rate and regular rhythm.  Pulmonary:     Comments: Decreased breath sounds bilaterally.  Abdominal:     Palpations: Abdomen is soft.  Musculoskeletal:        General: Normal range of motion.     Cervical back: Normal range of motion.  Skin:    General: Skin is warm.  Neurological:     General: No focal deficit present.     Mental Status: He is alert and oriented to person, place, and time.  Psychiatric:        Behavior: Behavior normal.        Judgment: Judgment normal.       LABORATORY DATA:  I have reviewed the data as listed    Component Value Date/Time   NA 145 (H) 06/26/2021 1416   K 4.5 06/26/2021 1416   CL 102 06/26/2021 1416   CO2 26 06/26/2021 1416   GLUCOSE 86 06/26/2021 1416   BUN 16 06/26/2021 1416  CREATININE 1.06 06/26/2021 1416   CALCIUM 9.9 06/26/2021 1416   PROT 6.7 06/26/2021 1416   ALBUMIN 4.6 06/26/2021 1416   AST 17 06/26/2021 1416   ALT 15 06/26/2021 1416   ALKPHOS 62 06/26/2021 1416   BILITOT 0.4 06/26/2021 1416   GFRNONAA 65 05/10/2020 1017   GFRAA 75 05/10/2020 1017    No results found for: SPEP, UPEP  Lab Results  Component Value Date   WBC 5.2 06/26/2021   NEUTROABS 3.1 06/26/2021   HGB 14.9 06/26/2021   HCT 45.1 06/26/2021   MCV 94 06/26/2021   PLT 258 06/26/2021      Chemistry      Component Value Date/Time   NA 145 (H) 06/26/2021 1416   K 4.5 06/26/2021 1416   CL 102 06/26/2021 1416   CO2 26 06/26/2021 1416   BUN 16 06/26/2021 1416   CREATININE 1.06 06/26/2021 1416   GLU 96 10/19/2014 0000      Component Value Date/Time   CALCIUM 9.9 06/26/2021 1416   ALKPHOS 62 06/26/2021 1416   AST 17 06/26/2021 1416   ALT 15 06/26/2021 1416   BILITOT 0.4 06/26/2021 1416       RADIOGRAPHIC STUDIES: I have personally reviewed the radiological images as listed and agreed with the findings in the report. No results found.   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  S/p excisional biopsy.  Await imaging including CT scan chest abdomen pelvis- on 10/20.  Also recommend MRI of the brain-given the headaches.Discussed with pathology, Dr.RUbinas-we will order foundation 1 testing.  #Await the results of the CT scan chest and pelvis-to help quantify the burden of disease.  If no significant disease noted anywhere else-axillary lymph node dissection with dissection could be considered.  We discussed with Dr. Bary Castilla after availability of imaging.  Consider PET scan after CT scan.    #Discussed the role of systemic therapy based on NGS testing.-targeted therapy [if positive forB-raf mutation-B-raf inhibitor plus MEK-Inhibitor.];  In absence of mutation-options include combination immunotherapy- ipi+ Nivo.  Choice of systemic therapy would also depend upon burden of the disease on imaging.  #Headaches-acute-recommend MRI brain stat.  #Discussed with the patient and wife-hard to prognosticate/and give different treatment plans at this time while awaiting above work-up.  Also discussed regarding second opinion at a tertiary care center.  However at this time await above work-up.  #Vaccinations : flu shot-right arm; if no significant symptoms proceed with COVID shot later this week  Thank you Dr.Byrnett for allowing me to participate in the care of your pleasant patient. Please do not hesitate to contact me with questions or concerns in the interim.   # DISPOSITION: # flu shot today # MRI Brain STAT # Follow up TBD-Dr.B  Cc; Dr.Byrnett/Dr.Gilbert   Orders Placed This Encounter  Procedures   MR BRAIN W WO CONTRAST    Standing Status:   Future    Standing Expiration Date:   07/17/2022    Order Specific Question:   If indicated for the ordered procedure, I authorize the administration of contrast media per Radiology protocol    Answer:   Yes    Order Specific Question:   What is the  patient's sedation requirement?    Answer:   No Sedation    Order Specific Question:   Does the patient have a pacemaker or implanted devices?    Answer:   No    Order Specific  Question:   Use SRS Protocol?    Answer:   No    Order Specific Question:   Call Results- Best Contact Number?    Answer:   [phone] Do not Hold    Order Specific Question:   Preferred imaging location?    Answer:   [facility] (table limit - 550lbs)   All questions were answered. The patient knows to call the clinic with any problems, questions or concerns.      Cammie Sickle, MD 07/17/2021 4:44 PM

## 2021-07-17 NOTE — Progress Notes (Signed)
Pt states that he has developed a cough within the last week or so; not unusual cough per wife. Believes it might be allergies. However, does not cough at all during the night; only during the day.

## 2021-07-17 NOTE — Assessment & Plan Note (Addendum)
#  Metastatic melanoma to the left axilla.  S/p excisional biopsy.  Await imaging including CT scan chest abdomen pelvis- on 10/20.  Also recommend MRI of the brain-given the headaches.Discussed with pathology, Dr.RUbinas-we will order foundation 1 testing.  #Await the results of the CT scan chest and pelvis-to help quantify the burden of disease.  If no significant disease noted anywhere else-axillary lymph node dissection with dissection could be considered.  We discussed with Dr. Bary Castilla after availability of imaging.  Consider PET scan after CT scan.    #Discussed the role of systemic therapy based on NGS testing.-targeted therapy [if positive forB-raf mutation-B-raf inhibitor plus MEK-Inhibitor.];  In absence of mutation-options include combination immunotherapy- ipi+ Nivo.  Choice of systemic therapy would also depend upon burden of the disease on imaging.  #Headaches-acute-recommend MRI brain stat.  #Discussed with the patient and wife-hard to prognosticate/and give different treatment plans at this time while awaiting above work-up.  Also discussed regarding second opinion at a tertiary care center.  However at this time await above work-up.  #Vaccinations : flu shot-right arm; if no significant symptoms proceed with COVID shot later this week  Thank you Dr.Byrnett for allowing me to participate in the care of your pleasant patient. Please do not hesitate to contact me with questions or concerns in the interim.   # DISPOSITION: # flu shot today # MRI Brain STAT # Follow up TBD-Dr.B  Cc; Dr.Byrnett/Dr.Gilbert

## 2021-07-18 ENCOUNTER — Ambulatory Visit
Admission: RE | Admit: 2021-07-18 | Discharge: 2021-07-18 | Disposition: A | Discharge status: Home / Self Care | Payer: Medicare HMO | Source: Ambulatory Visit | Attending: Internal Medicine | Admitting: Internal Medicine

## 2021-07-18 ENCOUNTER — Encounter: Payer: Self-pay | Admitting: Internal Medicine

## 2021-07-18 ENCOUNTER — Telehealth: Payer: Self-pay | Admitting: *Deleted

## 2021-07-18 DIAGNOSIS — G9389 Other specified disorders of brain: Secondary | ICD-10-CM | POA: Diagnosis not present

## 2021-07-18 DIAGNOSIS — C439 Malignant melanoma of skin, unspecified: Secondary | ICD-10-CM

## 2021-07-18 DIAGNOSIS — C779 Secondary and unspecified malignant neoplasm of lymph node, unspecified: Secondary | ICD-10-CM | POA: Diagnosis not present

## 2021-07-18 DIAGNOSIS — R519 Headache, unspecified: Secondary | ICD-10-CM | POA: Diagnosis not present

## 2021-07-18 IMAGING — MR MR HEAD WO/W CM
15 series · 48 of 48 positions shown · IV contrast (gadavist)
Comparison: None.

CLINICAL DATA: Malignant melanoma metastatic to lymph node;
headaches/melanoma/cancer. Acute nonintractable headache,
unspecified headache type.

EXAM:
MRI HEAD WITHOUT AND WITH CONTRAST
TECHNIQUE: Multiplanar, multiecho pulse sequences of the brain and surrounding
structures were obtained without and with intravenous contrast.
CONTRAST:  9mL GADAVIST GADOBUTROL 1 MMOL/ML IV SOLN

[Series 5: ax dwi_tracew · axial · 3.0mm · 0.65mm/px · z∈[-48,+106]mm · 4 of 48 slices shown]
[im 1/48]
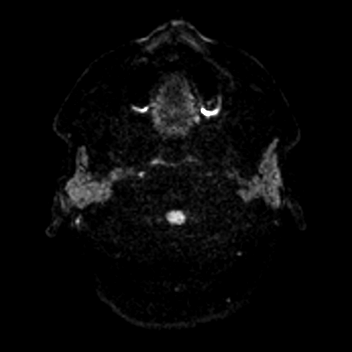
[im 16/48]
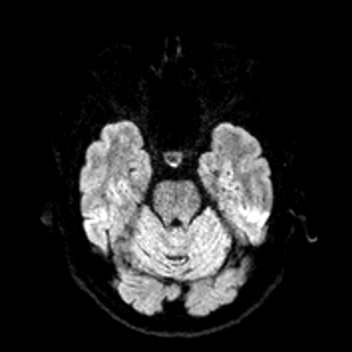
[im 32/48]
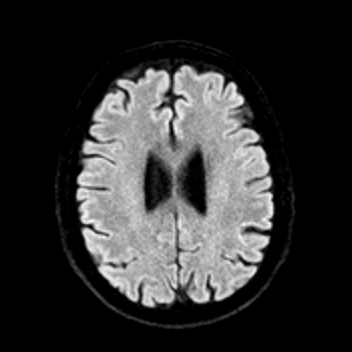
[im 48/48]
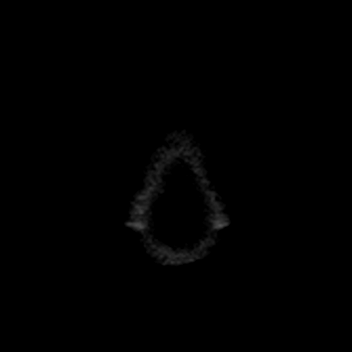

[Series 6: ax dwi_adc · axial · 3.0mm · 0.65mm/px · z∈[-48,+106]mm · 3 of 48 slices shown]
[im 1/48]
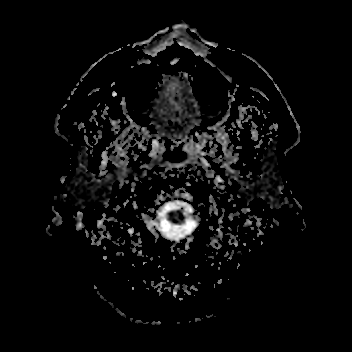
[im 24/48]
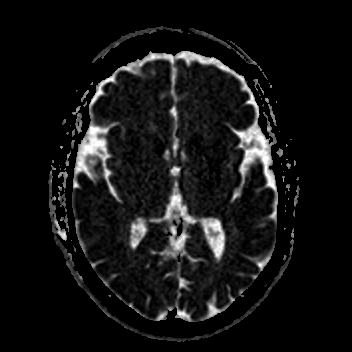
[im 48/48]
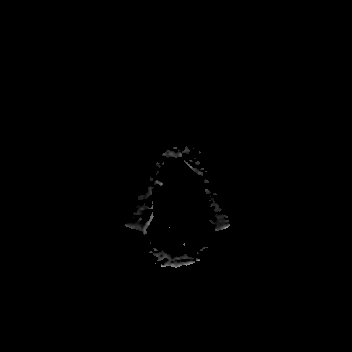

[Series 7: cor dwi_tracew · coronal · 5.0mm · 0.65mm/px · 2 of 40 slices shown]
[im 1/40]
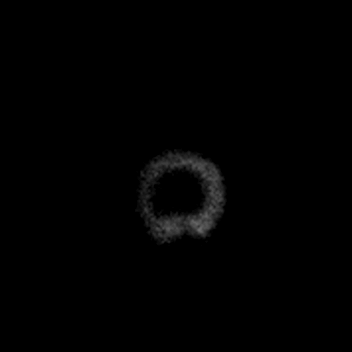
[im 40/40]
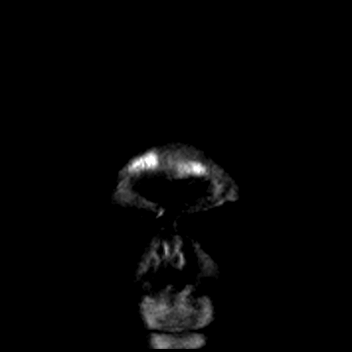

[Series 8: cor dwi_adc · coronal · 5.0mm · 0.65mm/px · 2 of 40 slices shown]
[im 1/40]
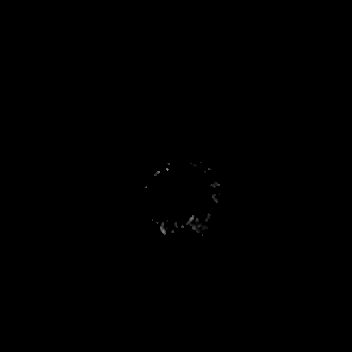
[im 40/40]
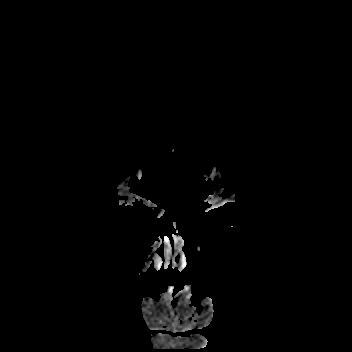

[Series 9: T1 · sagittal · 5.0mm · 0.62mm/px · 1 of 25 slices shown (1 of 2)]
[im 1/25]
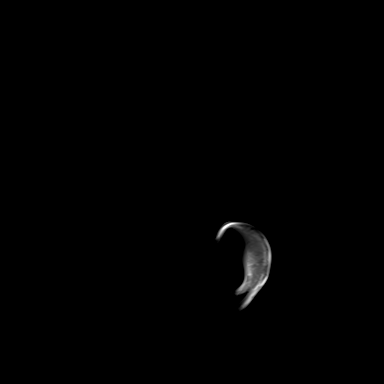

[Series 10: T2 · axial · 5.0mm · 0.53mm/px · 1 of 25 slices shown]
[im 1/25]
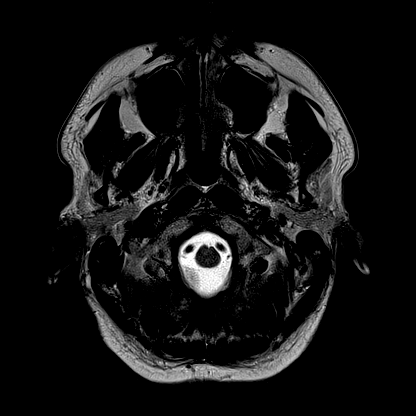

[Series 11: mag_images · axial · 3.0mm · 0.90mm/px · z∈[-59,+117]mm · 3 of 60 slices shown]
[im 1/60]
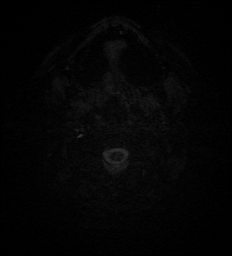
[im 30/60]
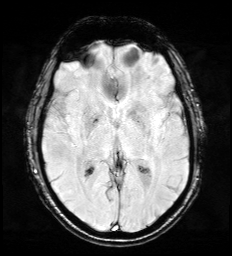
[im 60/60]
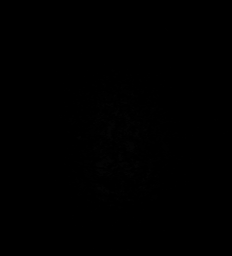

[Series 12: pha_images · axial · 3.0mm · 0.90mm/px · z∈[-59,+114]mm · 3 of 59 slices shown]
[im 1/59]
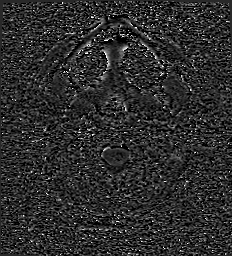
[im 30/59]
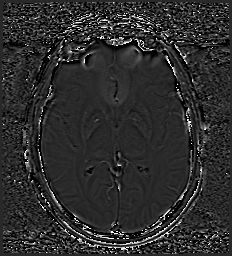
[im 59/59]
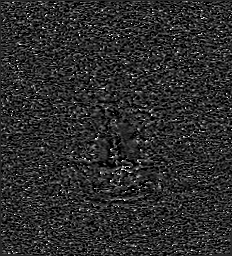

[Series 13: swi_images · axial · 3.0mm · 0.90mm/px · z∈[-59,+117]mm · 3 of 60 slices shown]
[im 1/60]
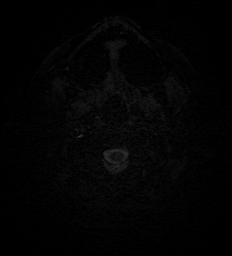
[im 30/60]
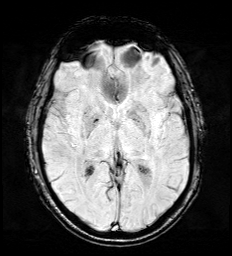
[im 60/60]
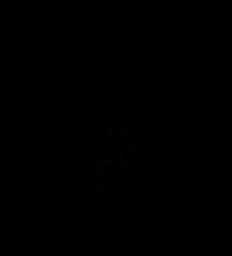

[Series 15: FLAIR · axial · 3.0mm · 0.53mm/px · z∈[-52,+109]mm · 3 of 55 slices shown]
[im 1/55]
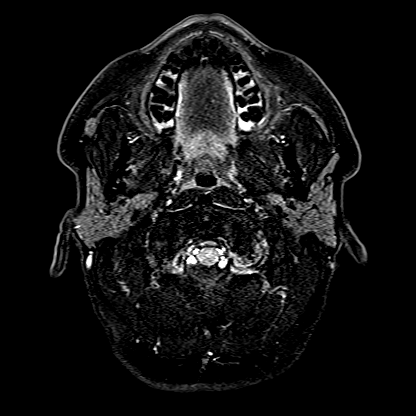
[im 28/55]
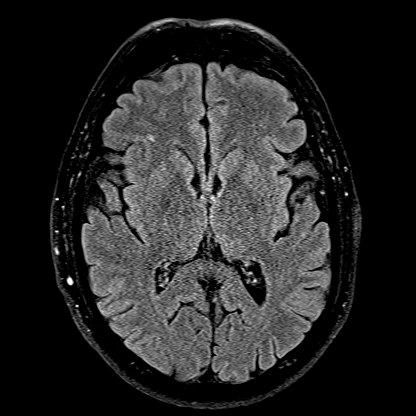
[im 55/55]
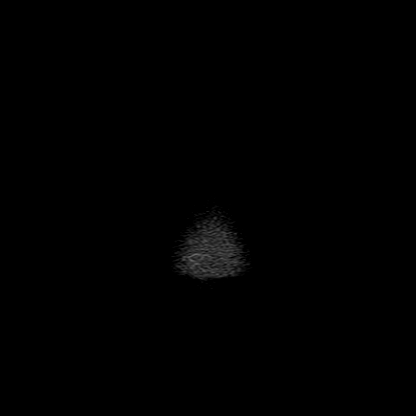

[Series 16: T1 · axial · 1.0mm · 0.98mm/px · z∈[-57,+117]mm · 9 of 176 slices shown (2 of 2)]
[im 1/176]
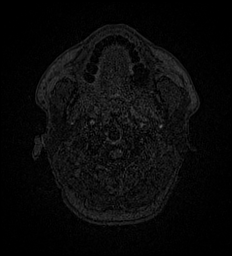
[im 22/176]
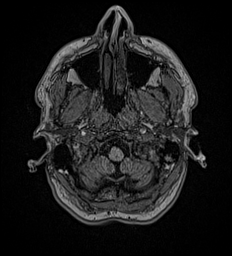
[im 44/176]
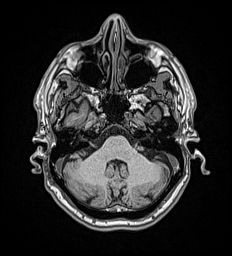
[im 66/176]
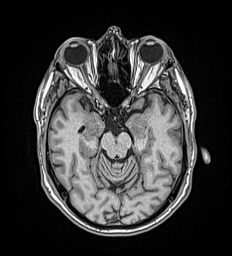
[im 88/176]
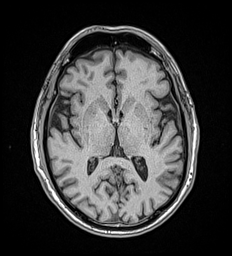
[im 110/176]
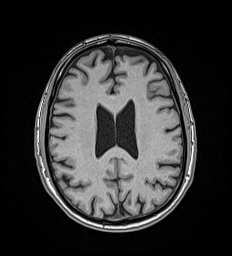
[im 132/176]
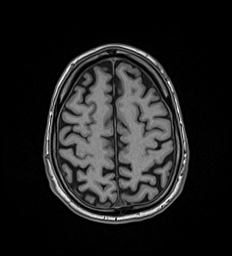
[im 154/176]
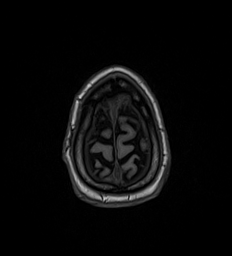
[im 176/176]
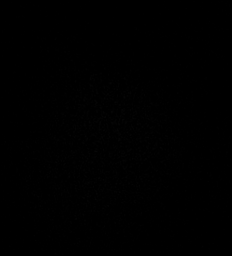

[Series 17: T2 post-contrast · coronal · 5.0mm · 0.57mm/px · 2 of 29 slices shown]
[im 1/29]
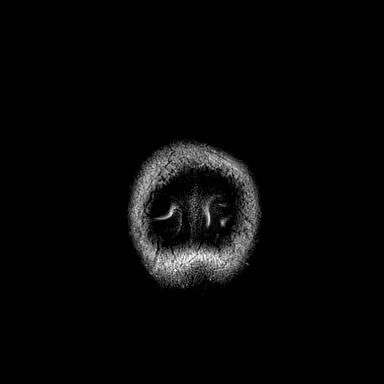
[im 29/29]
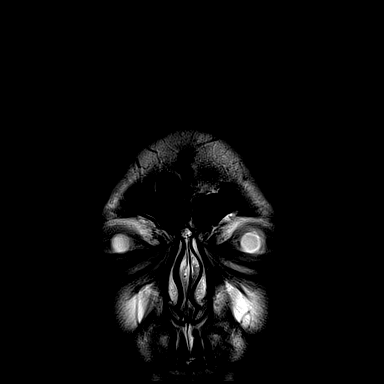

[Series 18: T1 post-contrast · axial · 1.0mm · 0.98mm/px · z∈[-57,+117]mm · 9 of 176 slices shown (1 of 3)]
[im 1/176]
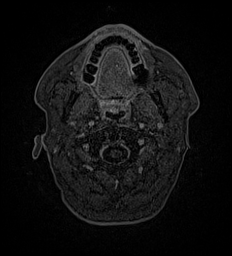
[im 22/176]
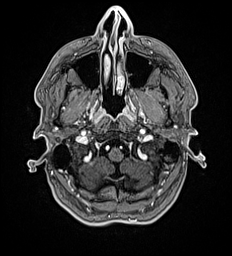
[im 44/176]
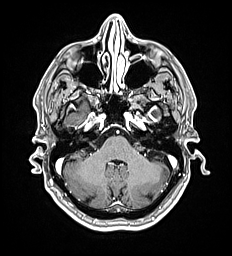
[im 66/176]
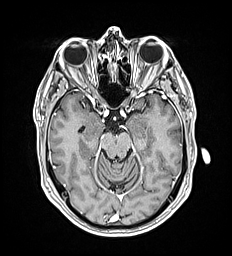
[im 88/176]
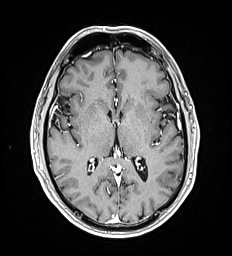
[im 110/176]
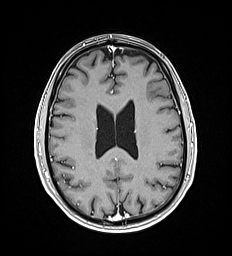
[im 132/176]
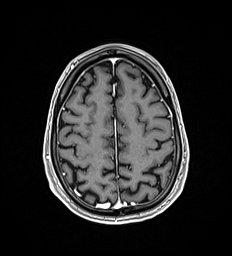
[im 154/176]
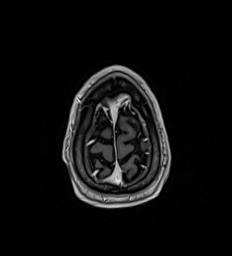
[im 176/176]
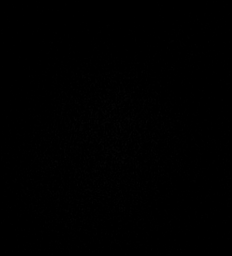

[Series 19: T1 post-contrast · coronal · 5.0mm · 0.57mm/px · 2 of 29 slices shown (2 of 3)]
[im 1/29]
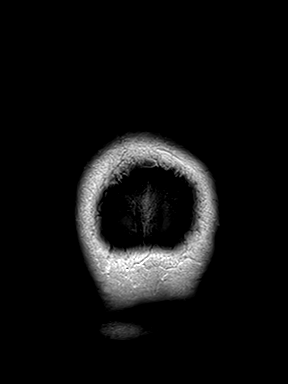
[im 29/29]
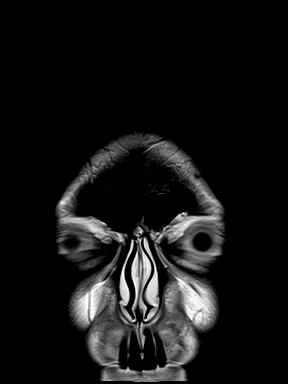

[Series 20: T1 post-contrast · sagittal · 5.0mm · 0.62mm/px · 1 of 25 slices shown (3 of 3)]
[im 1/25]
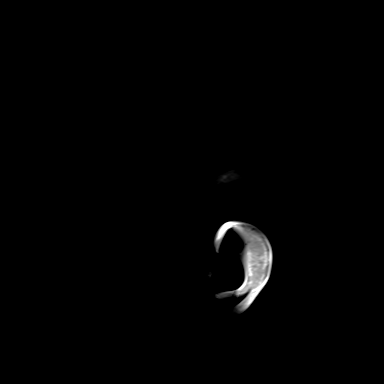

[48 of 48 positions shown; findings below may reference images not displayed]

FINDINGS: Brain: No acute infarction, hemorrhage, hydrocephalus, extra-axial
collection or mass lesion. A few scattered small foci of T2
hyperintensity are seen within the white matter of the cerebral
hemispheres, nonspecific. No focus of abnormal contrast enhancement.

Vascular: Normal flow voids.

Skull and upper cervical spine: Normal marrow signal.

Sinuses/Orbits: Negative.

Other: None.
IMPRESSION: 1. No evidence of intracranial metastatic disease.
2. Minimal amount of nonspecific T2 hyperintense lesions of the
white matter, may represent a chronic microangiopathy.

## 2021-07-18 MED ORDER — GADOBUTROL 1 MMOL/ML IV SOLN
9.0000 mL | Freq: Once | INTRAVENOUS | Status: AC | PRN
  Administered 2021-07-18: 9 mL via INTRAVENOUS

## 2021-07-18 NOTE — Telephone Encounter (Signed)
Foundation One submitted per v/o Dr. Rogue Bussing

## 2021-07-19 ENCOUNTER — Other Ambulatory Visit: Payer: Medicare HMO

## 2021-07-19 NOTE — Progress Notes (Signed)
Tumor Board Documentation  [name] was presented by Dr Rogue Bussing at our Tumor Board on 07/19/2021, which included representatives from medical oncology, radiation oncology, internal medicine, navigation, pathology, radiology, surgical, genetics, research, pharmacy, pulmonology.  [name] currently presents as a new patient, for [name], for new positive pathology with history of the following treatments: surgical intervention(s), active survellience.  Additionally, we reviewed previous medical and familial history, history of present illness, and recent lab results along with all available histopathologic and imaging studies. The tumor board considered available treatment options and made the following recommendations: Surgery (Axillary dissection) Adjuvant Immunotherapy and Targeted therapy after surgery  The following procedures/referrals were also placed: No orders of the defined types were placed in this encounter.   Clinical Trial Status: not discussed   Staging used: To be determined AJCC Staging:       Group: Metastatic Melanoma   National site-specific guidelines   were discussed with respect to the case.  Tumor board is a meeting of clinicians from various specialty areas who evaluate and discuss patients for whom a multidisciplinary approach is being considered. Final determinations in the plan of care are those of the provider(s). The responsibility for follow up of recommendations given during tumor board is that of the provider.   Today's extended care, comprehensive team conference, [name] was not present for the discussion and was not examined.   Multidisciplinary Tumor Board is a multidisciplinary case peer review process.  Decisions discussed in the Multidisciplinary Tumor Board reflect the opinions of the specialists present at the conference without having examined the patient.  Ultimately, treatment and diagnostic decisions rest with the primary provider(s) and  the patient.

## 2021-07-24 DIAGNOSIS — M62830 Muscle spasm of back: Secondary | ICD-10-CM | POA: Diagnosis not present

## 2021-07-24 DIAGNOSIS — M9903 Segmental and somatic dysfunction of lumbar region: Secondary | ICD-10-CM | POA: Diagnosis not present

## 2021-07-24 DIAGNOSIS — M9901 Segmental and somatic dysfunction of cervical region: Secondary | ICD-10-CM | POA: Diagnosis not present

## 2021-07-24 DIAGNOSIS — M5033 Other cervical disc degeneration, cervicothoracic region: Secondary | ICD-10-CM | POA: Diagnosis not present

## 2021-07-25 ENCOUNTER — Encounter: Payer: Medicare HMO | Admitting: Hospice and Palliative Medicine

## 2021-07-26 ENCOUNTER — Encounter: Payer: Self-pay | Admitting: Internal Medicine

## 2021-07-26 ENCOUNTER — Other Ambulatory Visit: Payer: Self-pay

## 2021-07-26 ENCOUNTER — Ambulatory Visit
Admission: RE | Admit: 2021-07-26 | Discharge: 2021-07-26 | Disposition: A | Discharge status: Home / Self Care | Payer: Medicare HMO | Source: Ambulatory Visit | Attending: General Surgery | Admitting: General Surgery

## 2021-07-26 DIAGNOSIS — K5730 Diverticulosis of large intestine without perforation or abscess without bleeding: Secondary | ICD-10-CM | POA: Diagnosis not present

## 2021-07-26 DIAGNOSIS — D7389 Other diseases of spleen: Secondary | ICD-10-CM | POA: Diagnosis not present

## 2021-07-26 DIAGNOSIS — C439 Malignant melanoma of skin, unspecified: Secondary | ICD-10-CM | POA: Insufficient documentation

## 2021-07-26 DIAGNOSIS — I2510 Atherosclerotic heart disease of native coronary artery without angina pectoris: Secondary | ICD-10-CM | POA: Diagnosis not present

## 2021-07-26 DIAGNOSIS — C779 Secondary and unspecified malignant neoplasm of lymph node, unspecified: Secondary | ICD-10-CM | POA: Diagnosis not present

## 2021-07-26 DIAGNOSIS — J984 Other disorders of lung: Secondary | ICD-10-CM | POA: Diagnosis not present

## 2021-07-26 DIAGNOSIS — K7689 Other specified diseases of liver: Secondary | ICD-10-CM | POA: Diagnosis not present

## 2021-07-26 IMAGING — CT CT CHEST-ABD-PELV W/ CM
2 of 5 series · 12 of 36 positions shown, 14 images · IV contrast (omnipaque)
Comparison: Chest radiograph [DATE]

CLINICAL DATA: Malignant melanoma metastatic to lymph node. Left
axillary lump.

EXAM:
CT CHEST, ABDOMEN, AND PELVIS WITH CONTRAST
TECHNIQUE: Multidetector CT imaging of the chest, abdomen and pelvis was
performed following the standard protocol during bolus
administration of intravenous contrast.
CONTRAST:  100mL OMNIPAQUE IOHEXOL 300 MG/ML  SOLN

[Series 2: axials cap 5.00 · axial · 0.81mm/px · z∈[-1528,-943]mm · 9 of 147 slices shown, 11 images]
[im 15/147  mediastinal]
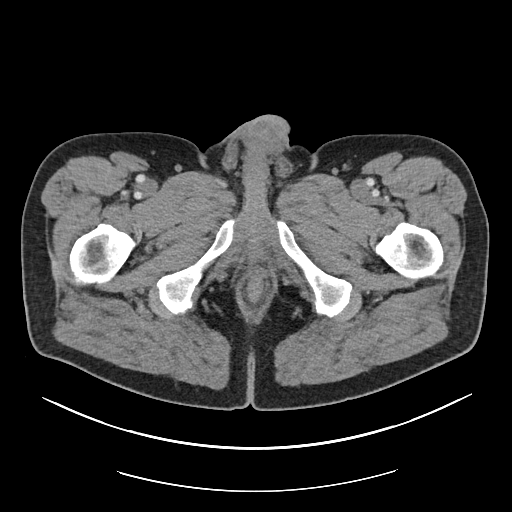
[im 15/147  bone]
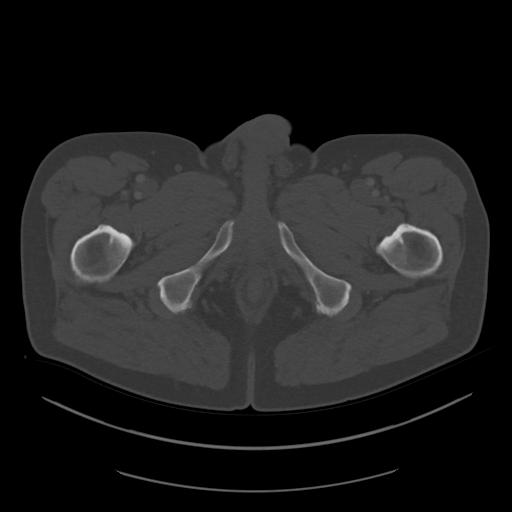
[im 30/147  mediastinal]
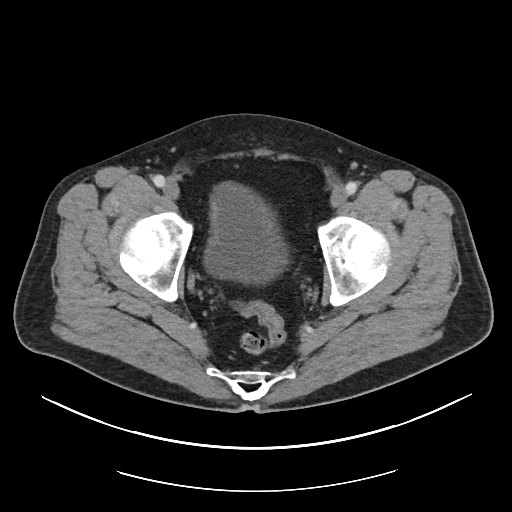
[im 44/147  mediastinal]
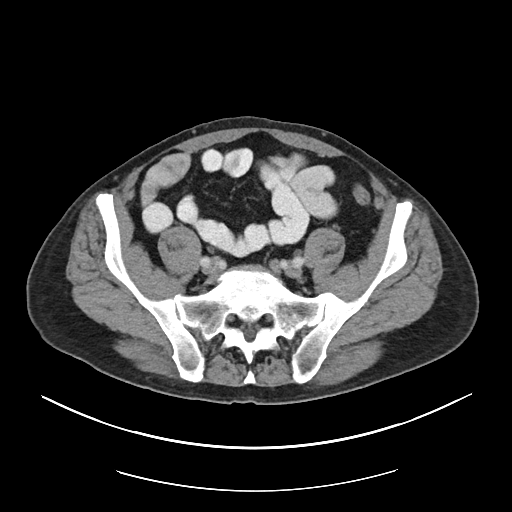
[im 59/147  mediastinal]
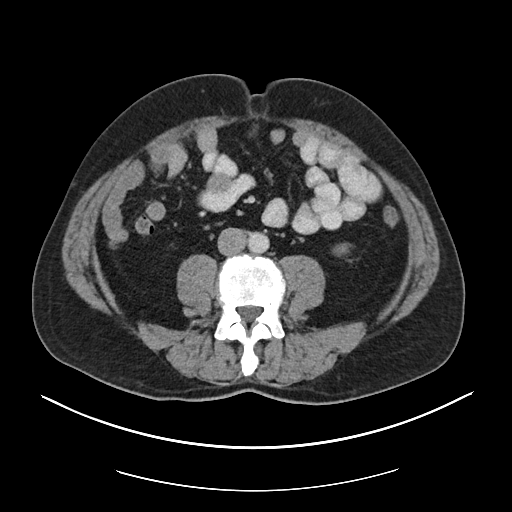
[im 74/147  mediastinal]
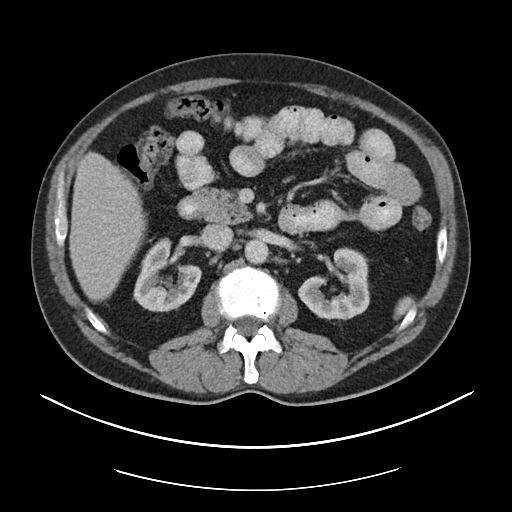
[im 88/147  mediastinal]
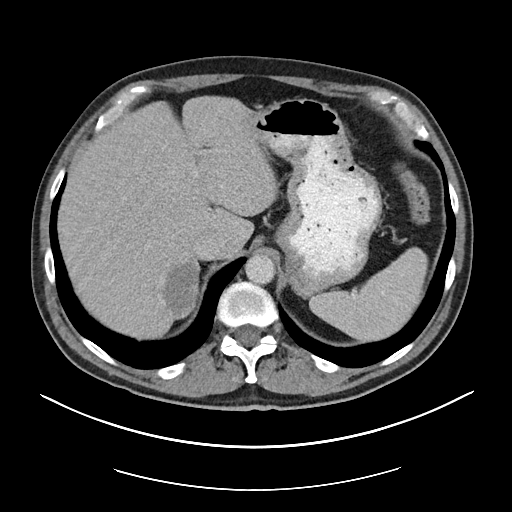
[im 103/147  mediastinal]
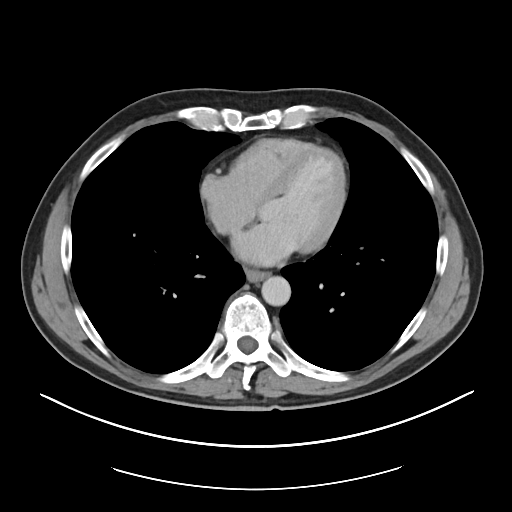
[im 117/147  mediastinal]
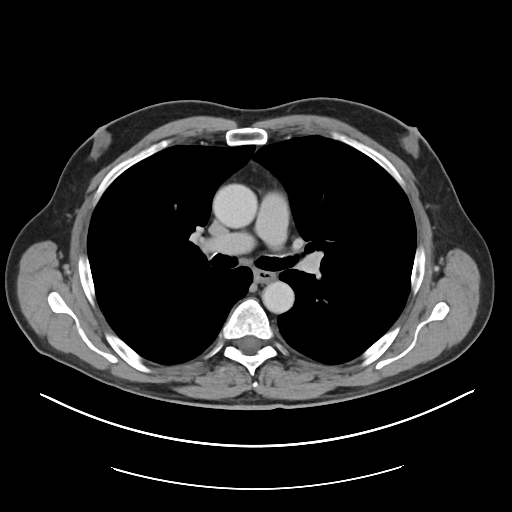
[im 132/147  mediastinal]
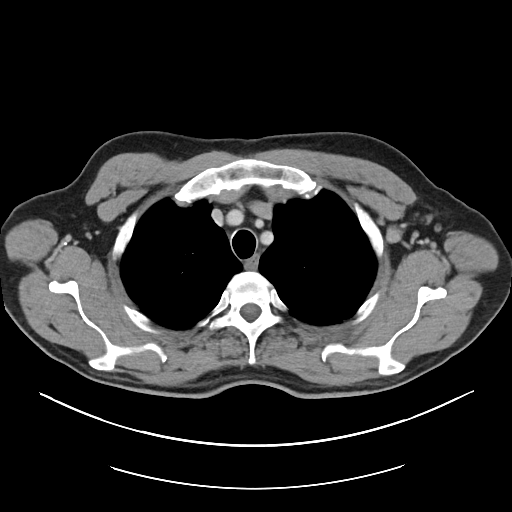
[im 132/147  bone]
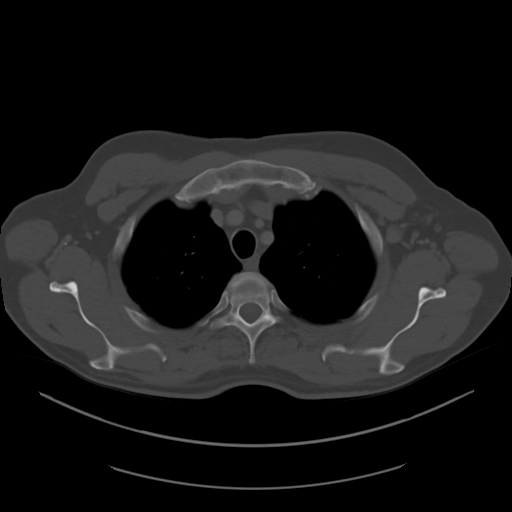

[Series 4: coronals cap 2.00 cor · coronal · 0.80mm/px · 3 of 147 slices shown]
[im 30/147  mediastinal]
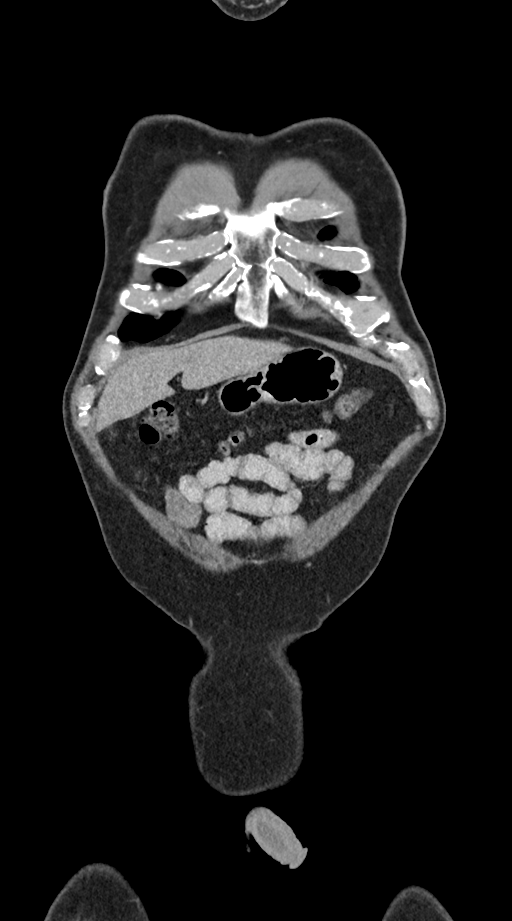
[im 59/147  mediastinal]
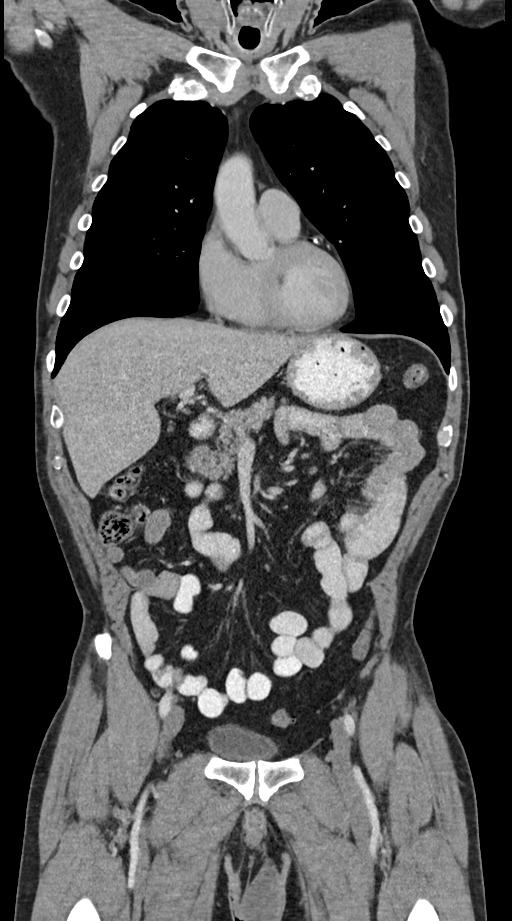
[im 88/147  mediastinal]
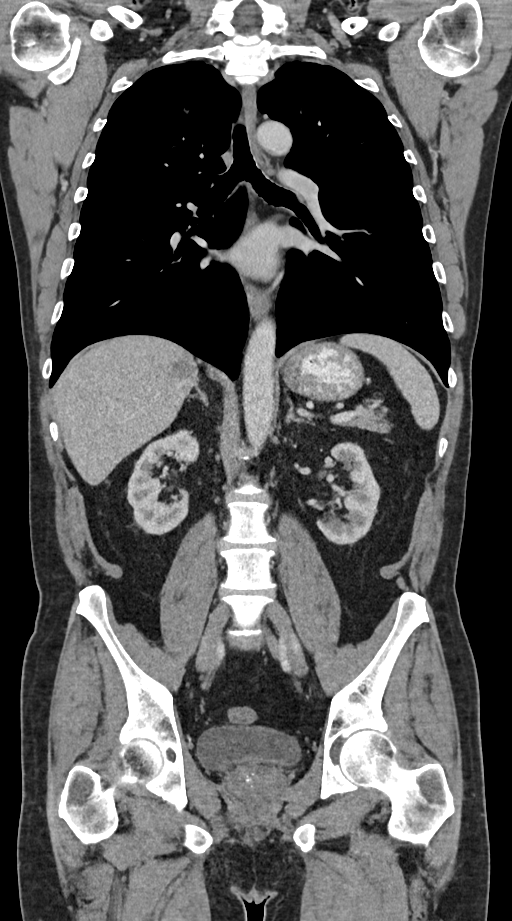

[12 of 36 positions shown; findings below may reference images not displayed]

FINDINGS: CT CHEST FINDINGS

Cardiovascular: Normal caliber thoracic aorta. Minimal aortic
atherosclerosis. There are coronary artery calcifications. Normal
heart size. No pericardial effusion or thickening.

Mediastinum/Nodes: Heterogeneous enlarged left axillary lymph node
measures 2.6 x 3.4 cm. There is some central low density which may
indicate necrosis. Mild perinodal soft tissue edema. Additional
prominent left axillary nodes measure up to 14 mm short axis. No
enlarged right axillary lymph nodes. No enlarged mediastinal or
hilar lymph nodes. No esophageal wall thickening. No visualized
thyroid nodule

Lungs/Pleura: Minimal biapical pleuroparenchymal scarring. No
pulmonary nodule or mass. No focal airspace disease or confluent
consolidation. No pleural effusion or thickening. Trachea and
central bronchi are patent.

Musculoskeletal: Peripherally sclerotic lucency in the right lateral
eighth rib, series 3, image 135. Mild degenerative change of both
glenohumeral joints. No destructive bone lesion. No subcutaneous
soft tissue abnormality, other than left axillary adenopathy. There
is minimal symmetric bilateral gynecomastia.

CT ABDOMEN PELVIS FINDINGS

Hepatobiliary: 4.2 cm low-density lesion in the subcapsular right
hepatic lobe has peripheral contrast puddling and fill-in on delayed
phase imaging consistent with hemangioma. 2.3 cm water density
lesion in the inferior right lobe series 2, image 68, most
consistent with cyst. There is a 6 mm central hypodensity that is
too small to accurately characterize. Gallbladder physiologically
distended, no calcified stone. No biliary dilatation.

Pancreas: No ductal dilatation or inflammation.  No pancreatic mass.

Spleen: Partially exophytic 13 mm low-density lesion in the
posterior spleen, series 2, image 64. Spleen is normal in size.
Splenule anteriorly.

Adrenals/Urinary Tract: No hydronephrosis. No focal renal lesion or
stone. Homogeneous enhancement with symmetric excretion on delayed
phase imaging.

Stomach/Bowel: No gastric wall thickening. No small bowel
obstruction or inflammation. Administered enteric contrast reaches
distal small bowel. Normal appendix. Mild colonic diverticulosis,
prominent in the sigmoid colon. No diverticulitis. No obvious
colonic mass. The left colon is decompressed which limits detailed
assessment.

Vascular/Lymphatic: Small retroperitoneal lymph nodes. Largest left
periaortic node measures 7 mm short axis series 2, image 83.
Aortocaval node measures 6 mm short axis series 2, image 79. No
enlarged lymph nodes by size criteria. No pelvic adenopathy. Normal
caliber abdominal aorta with minimal atherosclerosis. Patent portal
vein.

Reproductive: Unremarkable prostate.

Other: No ascites. No omental thickening tiny fat containing
umbilical hernia no subcutaneous soft tissue abnormalities.

Musculoskeletal: No focal bone lesion or acute osseous abnormality.
Mild degenerative change in the spine and both hips. Visualized
intramuscular lesions.
IMPRESSION: 1. Left axillary adenopathy with dominant lymph node measuring 2.6 x
3.4 cm, potentially partially necrotic. Additional prominent left
axillary nodes. Findings are suspicious for metastatic disease.
2. Small retroperitoneal lymph nodes are not enlarged by size
criteria, and nonspecific, largest measuring 7 mm. No enlarged lymph
nodes by size criteria.
3. Partially exophytic 13 mm low-density lesion in the posterior
spleen. Splenic lesions are typically incidental, however
technically indeterminate in this case.
4. Right hepatic hemangioma. Additional hepatic cysts.
5. Colonic diverticulosis without diverticulitis.
6. Peripherally sclerotic lucency in the right lateral eighth rib.
In isolation, this is nonspecific, but favored to be benign.
7. Mild aortic atherosclerosis.  Coronary artery calcifications.

## 2021-07-26 MED ORDER — IOHEXOL 300 MG/ML  SOLN
100.0000 mL | Freq: Once | INTRAMUSCULAR | Status: AC | PRN
  Administered 2021-07-26: 100 mL via INTRAVENOUS

## 2021-07-31 DIAGNOSIS — C439 Malignant melanoma of skin, unspecified: Secondary | ICD-10-CM | POA: Diagnosis not present

## 2021-08-01 ENCOUNTER — Encounter: Payer: Self-pay | Admitting: Internal Medicine

## 2021-08-02 ENCOUNTER — Other Ambulatory Visit: Payer: Self-pay

## 2021-08-02 ENCOUNTER — Encounter: Payer: Self-pay | Admitting: Internal Medicine

## 2021-08-02 ENCOUNTER — Inpatient Hospital Stay: Payer: Medicare HMO | Admitting: Internal Medicine

## 2021-08-02 DIAGNOSIS — K7689 Other specified diseases of liver: Secondary | ICD-10-CM | POA: Diagnosis not present

## 2021-08-02 DIAGNOSIS — Z801 Family history of malignant neoplasm of trachea, bronchus and lung: Secondary | ICD-10-CM | POA: Diagnosis not present

## 2021-08-02 DIAGNOSIS — D7389 Other diseases of spleen: Secondary | ICD-10-CM | POA: Diagnosis not present

## 2021-08-02 DIAGNOSIS — C439 Malignant melanoma of skin, unspecified: Secondary | ICD-10-CM

## 2021-08-02 DIAGNOSIS — C779 Secondary and unspecified malignant neoplasm of lymph node, unspecified: Secondary | ICD-10-CM | POA: Diagnosis not present

## 2021-08-02 DIAGNOSIS — R519 Headache, unspecified: Secondary | ICD-10-CM | POA: Diagnosis not present

## 2021-08-02 DIAGNOSIS — Z23 Encounter for immunization: Secondary | ICD-10-CM | POA: Diagnosis not present

## 2021-08-02 NOTE — Progress Notes (Signed)
START OFF PATHWAY REGIMEN - Melanoma and Other Skin Cancers   [id number]:Pembrolizumab 200 mg IV D1 q21 Days:   A cycle is every 21 days:     Pembrolizumab   **Always confirm dose/schedule in your pharmacy ordering system**  Patient Characteristics: Melanoma, Cutaneous/Unknown Primary, Regional (Nodal) Recurrence / In Transit Disease, Unresectable, Systemic Therapy Indicated, Other Mutations/Biomarkers, Other Disease Classification: Melanoma Disease Subtype: Cutaneous BRAF V600 Mutation Status: Other Mutations/Biomarkers Therapeutic Status: Regional (Nodal) Recurrence / In Transit Disease Type of Therapy: Systemic Therapy Indicated Mutation/Biomarker Type: Other Intent of Therapy: Curative Intent, Discussed with Patient

## 2021-08-02 NOTE — Assessment & Plan Note (Addendum)
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  S/p excisional biopsy- OCT 20th, 2022- Left axillary adenopathy with dominant lymph node measuring 2.6 x 3.4 cm, potentially partially necrotic; along with additional prominent left axillary nodes; retroperitoneal  retroperitoneal lymph nodes are not enlarged by size.  Will need a PET scan for further evaluation of the retroperitoneal lymph nodes.  I reviewed the recent data from S1801-that showed significant improvement in relapse free survival with neoadjuvant/adjuvant vs adjuvant Keytruda.  Total 18 doses of Keytruda.  The even free survival at 3 years with Keytruda adjuvant-60 to 70%.  #  I discussed the mechanism of action; The goal of therapy is curative and length of treatments are likely ongoing/based upon the results of the scans. Discussed the potential side effects of immunotherapy including but not limited to diarrhea; skin rash; elevated LFTs/endocrine abnormalities etc.  # #Incidental findings on CT scan maging dated: Oct 20th 2022: Hepatic cysts/hepatic hemangiom; 13 mm exophytic splenic lesion-indeterminate; right lateral rib neurosis.  I reviewed/discussed/counseled the patient.  Again PET scan would be helpful in figuring out the indeterminate findings noted on CT scan.  #  DISPOSITION: # PET scan ASAP [if cant get next week; pt can go to [facility]] # chemo education ASAP re: [name] # follow up week of Nov 7th, MD; labs- cbc/cmp;TSH; [name]-- Dr.B  # 40 minutes face-to-face with the patient discussing the above plan of care; more than 50% of time spent on prognosis/ natural history; counseling and coordination.   Cc; Dr.Byrnett/Dr.Gilbert

## 2021-08-02 NOTE — Progress Notes (Signed)
[facility] OFFICE PROGRESS NOTE  Patient Care Team: Jerrol Banana., MD as PCP - General [facility])  Cancer Staging No matching staging information was found for the patient.   Oncology History Overview Note  #  2015- left collar bone [Dr.Kowalski]- s/p resection- 0.65 mm  #  A. 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   There is sufficient material for ancillary molecular testing if needed  (block A3, A1, A2).   Comment:  Per provided outside pathology report the patient had an "at least pT1b"  malignant melanoma of the left lateral infraclavicular skin in 2015.  Touch preparations demonstrate predominantly discohesive large  pleomorphic cells, some with binucleation.  HE sections demonstrate  lymph node parenchyma involved by metastatic neoplasm.  The neoplastic  cells are pleomorphic with binucleation, focal intranuclear inclusions,  and associated tumor necrosis. The neoplastic cells demonstrate the  following pattern of immunoreactivity:  S100: Positive  SOX10: Positive  HMB-45: Negative  Melan-A: Negative  Super pancytokeratin: Negative  CD45: Negative  This pattern of immunoreactivity supports the above diagnosis.      Malignant melanoma metastatic to lymph node ([place])  07/17/2021 Initial Diagnosis   Malignant melanoma metastatic to lymph node ([place])   08/02/2021 -  Chemotherapy   Patient is on Treatment Plan : MELANOMA Pembrolizumab q21d       HPI: Ambulating independently.  Accompanied by his wife.  [name] 65 y.o.  male pleasant patient above history of recurrent melanoma left axillary-s/p-biopsy is here to review the results of his CT scan/plan of care.   Patient denies any new lumps or bumps.  Denies any nausea vomiting.   Review of Systems  Constitutional:  Negative for chills, diaphoresis, fever, malaise/fatigue and weight loss.  HENT:  Negative for nosebleeds and sore  throat.   Eyes:  Negative for double vision.  Respiratory:  Negative for hemoptysis, sputum production, shortness of breath and wheezing.   Cardiovascular:  Negative for chest pain, palpitations, orthopnea and leg swelling.  Gastrointestinal:  Negative for abdominal pain, blood in stool, constipation, diarrhea, heartburn, melena, nausea and vomiting.  Genitourinary:  Negative for dysuria, frequency and urgency.  Musculoskeletal:  Negative for back pain and joint pain.  Skin: Negative.  Negative for itching and rash.  Neurological:  Negative for dizziness, tingling, focal weakness and weakness.  Endo/Heme/Allergies:  Does not bruise/bleed easily.  Psychiatric/Behavioral:  Negative for depression. The patient is not nervous/anxious and does not have insomnia.      PAST MEDICAL HISTORY :  Past Medical History:  Diagnosis Date   Basal cell carcinoma 03/10/2013   Right medial forehead. Excised, margins free.   Dysplastic nevus 11/05/2006   Mid back paraspinal, [name], close to edge. Excised 12/24/2006, margins free.   Dysplastic nevus 12/15/2008   Left lat. pectoral area. Moderate atypia, extends to one edge.    Dysplastic nevus 12/07/2009   Left mid side. Moderate atypia, close to margin.   Dysplastic nevus 12/07/2009   Right posterior waistline. Moderate atypia, close to margin.   Dysplastic nevus 02/10/2014   Left paraspinal mid back. Moderate atypia, lateral and deep margin involved.    Dysplastic nevus 06/16/2014   Left epigastric. Mild atypia, deep margin involved.    GERD (gastroesophageal reflux disease)    Hx of basal cell carcinoma    multiple sites   Hx of malignant melanoma 11/11/2013   L lateral infraclavicular, superficial spreading,  Breslow's 0.68mm, Clark's level III   Hypothyroidism    Melanoma ([place]) 11/11/2013   Left lateral infraclavicular. MM, superficial spreading. Anatomic level III. Tumor thickness 0.51mm. Excised 11/24/2013,  margins free.     PAST SURGICAL HISTORY :   Past Surgical History:  Procedure Laterality Date   COLONOSCOPY WITH PROPOFOL N/A 01/20/2017   Procedure: COLONOSCOPY WITH PROPOFOL;  Surgeon: Lollie Sails, MD;  Location: [facility] ENDOSCOPY;  Service: Endoscopy;  [name];   ESOPHAGOGASTRODUODENOSCOPY (EGD) WITH ESOPHAGEAL DILATION     MELANOMA EXCISION Right 11/24/2013   lateral infraclavicular   WISDOM TOOTH EXTRACTION      FAMILY HISTORY :   Family History  Problem Relation Age of Onset   Macular degeneration Mother    Hyperlipidemia Mother    Heart disease Father    Heart attack Father    Lung cancer Father        metastasized    SOCIAL HISTORY:   Social History   Tobacco Use   Smoking status: Never   Smokeless tobacco: Never  Substance Use Topics   Alcohol use: Yes    Comment: about 1 glass of wine with dinner   Drug use: No    ALLERGIES:  has No Known Allergies.  MEDICATIONS:  Current Outpatient Medications  Medication Sig Dispense Refill   aspirin 81 MG tablet Take by mouth.     levothyroxine (SYNTHROID) 50 MCG tablet TAKE 1 TABLET BY MOUTH EVERY DAY 90 tablet 0   Multiple Vitamins tablet Take by mouth.     Multiple Vitamins-Minerals (PRESERVISION AREDS 2 PO) Take by mouth.     pantoprazole (PROTONIX) 20 MG tablet Take 1 tablet by mouth daily.  0   No current facility-administered medications for this visit.    PHYSICAL EXAMINATION: ECOG PERFORMANCE STATUS: 0 - Asymptomatic  BP 132/84   Pulse 62   Temp (!) 96.8 F (36 C)   Resp 16   Wt 192 lb 6.4 oz (87.3 kg)   SpO2 100%   BMI 25.38 kg/m   Filed Weights   08/02/21 1421  Weight: 192 lb 6.4 oz (87.3 kg)   Positive for lymph node left underarm approximate 2 to 3 cm in size.  Physical Exam Vitals and nursing note reviewed.  HENT:     Head: Normocephalic and atraumatic.     Mouth/Throat:     Pharynx: Oropharynx is clear.  Eyes:     Extraocular Movements: Extraocular movements intact.      Pupils: Pupils are equal, round, and reactive to light.  Cardiovascular:     Rate and Rhythm: Normal rate and regular rhythm.  Pulmonary:     Comments: Decreased breath sounds bilaterally.  Abdominal:     Palpations: Abdomen is soft.  Musculoskeletal:        General: Normal range of motion.     Cervical back: Normal range of motion.  Skin:    General: Skin is warm.  Neurological:     General: No focal deficit present.     Mental Status: He is alert and oriented to person, place, and time.  Psychiatric:        Behavior: Behavior normal.        Judgment: Judgment normal.       LABORATORY DATA:  I have reviewed the data as listed    Component Value Date/Time   NA 145 (H) 06/26/2021 1416   K 4.5 06/26/2021 1416   CL 102 06/26/2021 1416   CO2 26 06/26/2021 1416  GLUCOSE 86 06/26/2021 1416   BUN 16 06/26/2021 1416   CREATININE 1.06 06/26/2021 1416   CALCIUM 9.9 06/26/2021 1416   PROT 6.7 06/26/2021 1416   ALBUMIN 4.6 06/26/2021 1416   AST 17 06/26/2021 1416   ALT 15 06/26/2021 1416   ALKPHOS 62 06/26/2021 1416   BILITOT 0.4 06/26/2021 1416   GFRNONAA 65 05/10/2020 1017   GFRAA 75 05/10/2020 1017    No results found for: SPEP, UPEP  Lab Results  Component Value Date   WBC 5.2 06/26/2021   NEUTROABS 3.1 06/26/2021   HGB 14.9 06/26/2021   HCT 45.1 06/26/2021   MCV 94 06/26/2021   PLT 258 06/26/2021      Chemistry      Component Value Date/Time   NA 145 (H) 06/26/2021 1416   K 4.5 06/26/2021 1416   CL 102 06/26/2021 1416   CO2 26 06/26/2021 1416   BUN 16 06/26/2021 1416   CREATININE 1.06 06/26/2021 1416   GLU 96 10/19/2014 0000      Component Value Date/Time   CALCIUM 9.9 06/26/2021 1416   ALKPHOS 62 06/26/2021 1416   AST 17 06/26/2021 1416   ALT 15 06/26/2021 1416   BILITOT 0.4 06/26/2021 1416       RADIOGRAPHIC STUDIES: I have personally reviewed the radiological images as listed and agreed with the findings in the report. No results found.    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  S/p excisional biopsy- OCT 20th, 2022- Left axillary adenopathy with dominant lymph node measuring 2.6 x 3.4 cm, potentially partially necrotic; along with additional prominent left axillary nodes; retroperitoneal  retroperitoneal lymph nodes are not enlarged by size.  Will need a PET scan for further evaluation of the retroperitoneal lymph nodes.  I reviewed the recent data from S1801-that showed significant improvement in relapse free survival with neoadjuvant/adjuvant vs adjuvant Keytruda.  Total 18 doses of Keytruda.  The even free survival at 3 years with Keytruda adjuvant-60 to 70%.  #  I discussed the mechanism of action; The goal of therapy is curative and length of treatments are likely ongoing/based upon the results of the scans. Discussed the potential side effects of immunotherapy including but not limited to diarrhea; skin rash; elevated LFTs/endocrine abnormalities etc.  # #Incidental findings on CT scan maging dated: Oct 20th 2022: Hepatic cysts/hepatic hemangiom; 13 mm exophytic splenic lesion-indeterminate; right lateral rib neurosis.  I reviewed/discussed/counseled the patient.  Again PET scan would be helpful in figuring out the indeterminate findings noted on CT scan.  #  DISPOSITION: # PET scan ASAP [if cant get next week; pt can go to [facility]] # chemo education ASAP re: [name] # follow up week of Nov 7th, MD; labs- cbc/cmp;TSH; [name]-- Dr.B  # 40 minutes face-to-face with the patient discussing the above plan of care; more than 50% of time spent on prognosis/ natural history; counseling and coordination.   Cc; Dr.Byrnett/Dr.Gilbert   Orders Placed This Encounter  Procedures   NM PET Image Initial (PI) Skull Base To Thigh (F-18 FDG)    Standing Status:   Future    Standing Expiration Date:   08/02/2022    Order Specific Question:   If indicated  for the ordered procedure, I authorize the administration of a radiopharmaceutical per Radiology protocol    Answer:   Yes    Order Specific Question:   Preferred imaging location?    Answer:   [facility]    Order Specific Question:   Radiology Contrast Protocol - do NOT remove file path    Answer:   [email or web address]   CBC with Differential/Platelet    Standing Status:   Future    Standing Expiration Date:   08/02/2022   Comprehensive metabolic panel    Standing Status:   Future    Standing Expiration Date:   08/02/2022   TSH    Standing Status:   Future    Standing Expiration Date:   08/02/2022   All questions were answered. The patient knows to call the clinic with any problems, questions or concerns.      Cammie Sickle, MD 08/02/2021 3:15 PM

## 2021-08-02 NOTE — Progress Notes (Signed)
Discuss with Dr.Byrnett regarding the plan for neo- adjuvant immunotherapy followed by Surgery.

## 2021-08-06 ENCOUNTER — Ambulatory Visit
Admission: RE | Admit: 2021-08-06 | Discharge: 2021-08-06 | Disposition: A | Discharge status: Home / Self Care | Payer: Medicare HMO | Source: Ambulatory Visit | Attending: Internal Medicine | Admitting: Internal Medicine

## 2021-08-06 ENCOUNTER — Other Ambulatory Visit: Payer: Self-pay

## 2021-08-06 DIAGNOSIS — D7389 Other diseases of spleen: Secondary | ICD-10-CM | POA: Diagnosis not present

## 2021-08-06 DIAGNOSIS — R590 Localized enlarged lymph nodes: Secondary | ICD-10-CM | POA: Diagnosis not present

## 2021-08-06 DIAGNOSIS — C439 Malignant melanoma of skin, unspecified: Secondary | ICD-10-CM | POA: Insufficient documentation

## 2021-08-06 DIAGNOSIS — C779 Secondary and unspecified malignant neoplasm of lymph node, unspecified: Secondary | ICD-10-CM | POA: Diagnosis not present

## 2021-08-06 DIAGNOSIS — I2510 Atherosclerotic heart disease of native coronary artery without angina pectoris: Secondary | ICD-10-CM | POA: Diagnosis not present

## 2021-08-06 DIAGNOSIS — K5730 Diverticulosis of large intestine without perforation or abscess without bleeding: Secondary | ICD-10-CM | POA: Diagnosis not present

## 2021-08-06 LAB — GLUCOSE, CAPILLARY: Glucose-Capillary: 79 mg/dL (ref 70–99)

## 2021-08-06 IMAGING — CT NM PET TUM IMG INITIAL (PI) SKULL BASE T - THIGH
1 of 9 series · 1 of 25 positions shown · non-contrast
Comparison: CT chest abdomen pelvis dated [DATE]

CLINICAL DATA: Initial treatment strategy for metastatic melanoma.

EXAM:
NUCLEAR MEDICINE PET SKULL BASE TO THIGH
TECHNIQUE: 10.6 mCi F-18 FDG was injected intravenously. Full-ring PET imaging
was performed from the skull base to thigh after the radiotracer. CT
data was obtained and used for attenuation correction and anatomic
localization.
Fasting blood glucose: 79 mg/dl

[Series 4: ct wb 5.0 b30f · axial · 5.0mm · 0.98mm/px · 1 of 408 slices shown]
[im 408/408  brain]
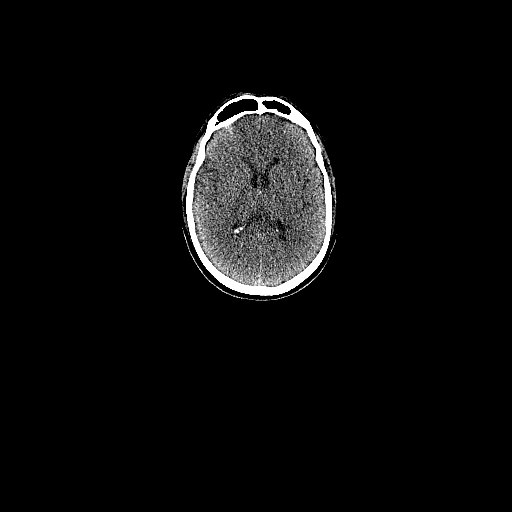

[1 of 25 positions shown; findings below may reference images not displayed]

FINDINGS: Mediastinal blood pool activity: SUV max

Liver activity: SUV max NA

NECK: No hypermetabolic cervical lymphadenopathy.

Incidental CT findings: none

CHEST: Left axillary lymphadenopathy, including:

--14 mm short axis left subpectoral node (series 4/image 89), max
SUV

--23 mm short axis left axillary node (series 4/image 104), max SUV
5.1

No suspicious pulmonary nodules.

Incidental CT findings: Mild atherosclerotic calcifications of the
arch. Coronary atherosclerosis of the LAD.

ABDOMEN/PELVIS: No abnormal hypermetabolism in the liver, spleen,
pancreas, or adrenal glands.

Benign hepatic hemangioma (better evaluated on prior CT) and hepatic
cysts, non FDG avid.

Small posterior splenic lesion on prior CT is non FDG avid.

No hypermetabolic abdominopelvic lymphadenopathy. Small
retroperitoneal lymph nodes measuring up to 6 mm short axis, non FDG
avid.

Incidental CT findings: Mild left colonic diverticulosis, without
evidence of diverticulitis.

SKELETON: No focal hypermetabolic activity to suggest skeletal
metastasis.

Incidental CT findings: none
IMPRESSION: Hypermetabolic left axillary nodes in this patient with known left
axillary nodal metastases

No evidence of distant metastases. Specifically, the splenic lesion
and small retroperitoneal nodes on prior CT are non FDG avid.

## 2021-08-06 MED ORDER — FLUDEOXYGLUCOSE F - 18 (FDG) INJECTION
10.6100 | Freq: Once | INTRAVENOUS | Status: AC | PRN
  Administered 2021-08-06: 10.61 via INTRAVENOUS

## 2021-08-06 NOTE — Progress Notes (Signed)
Pharmacist Chemotherapy Monitoring - Initial Assessment    Anticipated start date: 08/13/21   The following has been reviewed per standard work regarding the patient's treatment regimen: The patient's diagnosis, treatment plan and drug doses, and organ/hematologic function Lab orders and baseline tests specific to treatment regimen  The treatment plan start date, drug sequencing, and pre-medications Prior authorization status  Patient's documented medication list, including drug-drug interaction screen and prescriptions for anti-emetics and supportive care specific to the treatment regimen The drug concentrations, fluid compatibility, administration routes, and timing of the medications to be used The patient's access for treatment and lifetime cumulative dose history, if applicable  The patient's medication allergies and previous infusion related reactions, if applicable   Changes made to treatment plan:  treatment plan date  Follow up needed:  signing treatment plan   Adelina Mings, [place], 08/06/2021  8:58 AM

## 2021-08-07 DIAGNOSIS — H35413 Lattice degeneration of retina, bilateral: Secondary | ICD-10-CM | POA: Diagnosis not present

## 2021-08-07 DIAGNOSIS — H25813 Combined forms of age-related cataract, bilateral: Secondary | ICD-10-CM | POA: Diagnosis not present

## 2021-08-07 DIAGNOSIS — H31093 Other chorioretinal scars, bilateral: Secondary | ICD-10-CM | POA: Diagnosis not present

## 2021-08-07 DIAGNOSIS — H43822 Vitreomacular adhesion, left eye: Secondary | ICD-10-CM | POA: Diagnosis not present

## 2021-08-08 ENCOUNTER — Encounter: Payer: Self-pay | Admitting: Internal Medicine

## 2021-08-08 ENCOUNTER — Inpatient Hospital Stay: Payer: Medicare HMO | Attending: Internal Medicine | Admitting: Hospice and Palliative Medicine

## 2021-08-08 DIAGNOSIS — C779 Secondary and unspecified malignant neoplasm of lymph node, unspecified: Secondary | ICD-10-CM

## 2021-08-08 DIAGNOSIS — C439 Malignant melanoma of skin, unspecified: Secondary | ICD-10-CM | POA: Insufficient documentation

## 2021-08-08 DIAGNOSIS — Z5112 Encounter for antineoplastic immunotherapy: Secondary | ICD-10-CM | POA: Insufficient documentation

## 2021-08-08 DIAGNOSIS — Z801 Family history of malignant neoplasm of trachea, bronchus and lung: Secondary | ICD-10-CM | POA: Insufficient documentation

## 2021-08-08 DIAGNOSIS — C773 Secondary and unspecified malignant neoplasm of axilla and upper limb lymph nodes: Secondary | ICD-10-CM | POA: Insufficient documentation

## 2021-08-08 DIAGNOSIS — Z79899 Other long term (current) drug therapy: Secondary | ICD-10-CM | POA: Insufficient documentation

## 2021-08-08 NOTE — Progress Notes (Signed)
Spoke to patient regarding the results of the pet scan. The plan is to do three cycles of Immunotherapy. Reevaluating with Dr.Byrnett with ultrasound.

## 2021-08-08 NOTE — Progress Notes (Signed)
Multidisciplinary Oncology Council Documentation  [name] was presented by our [facility] on 08/08/2021, which included representatives from:  Palliative Care Dietitian  Physical/Occupational Therapist Nurse Navigator Genetics Speech Therapist Social work Survivorship RN Financial Navigator Research RN   [name] currently presents with history of metastatic melanoma  We reviewed previous medical and familial history, history of present illness, and recent lab results along with all available histopathologic and imaging studies. The [facility] considered available treatment options and made the following recommendations/referrals:  None currently  The MOC is a meeting of clinicians from various specialty areas who evaluate and discuss patients for whom a multidisciplinary approach is being considered. Final determinations in the plan of care are those of the provider(s).   Today's extended care, comprehensive team conference, [name] was not present for the discussion and was not examined.

## 2021-08-09 ENCOUNTER — Inpatient Hospital Stay: Payer: Medicare HMO

## 2021-08-09 ENCOUNTER — Other Ambulatory Visit: Payer: Self-pay

## 2021-08-13 ENCOUNTER — Encounter: Payer: Self-pay | Admitting: Internal Medicine

## 2021-08-13 ENCOUNTER — Inpatient Hospital Stay: Payer: Medicare HMO | Admitting: Internal Medicine

## 2021-08-13 ENCOUNTER — Other Ambulatory Visit: Payer: Self-pay

## 2021-08-13 ENCOUNTER — Inpatient Hospital Stay: Payer: Medicare HMO

## 2021-08-13 DIAGNOSIS — C439 Malignant melanoma of skin, unspecified: Secondary | ICD-10-CM

## 2021-08-13 DIAGNOSIS — Z5112 Encounter for antineoplastic immunotherapy: Secondary | ICD-10-CM | POA: Diagnosis not present

## 2021-08-13 DIAGNOSIS — C779 Secondary and unspecified malignant neoplasm of lymph node, unspecified: Secondary | ICD-10-CM

## 2021-08-13 DIAGNOSIS — Z79899 Other long term (current) drug therapy: Secondary | ICD-10-CM | POA: Diagnosis not present

## 2021-08-13 DIAGNOSIS — Z801 Family history of malignant neoplasm of trachea, bronchus and lung: Secondary | ICD-10-CM | POA: Diagnosis not present

## 2021-08-13 DIAGNOSIS — C773 Secondary and unspecified malignant neoplasm of axilla and upper limb lymph nodes: Secondary | ICD-10-CM | POA: Diagnosis not present

## 2021-08-13 LAB — CBC WITH DIFFERENTIAL/PLATELET
Abs Immature Granulocytes: 0.02 10*3/uL (ref 0.00–0.07)
Basophils Absolute: 0 10*3/uL (ref 0.0–0.1)
Basophils Relative: 1 %
Eosinophils Absolute: 0.2 10*3/uL (ref 0.0–0.5)
Eosinophils Relative: 3 %
HCT: 41.7 % (ref 39.0–52.0)
Hemoglobin: 14.1 g/dL (ref 13.0–17.0)
Immature Granulocytes: 1 %
Lymphocytes Relative: 38 %
Lymphs Abs: 1.7 10*3/uL (ref 0.7–4.0)
MCH: 31.5 pg (ref 26.0–34.0)
MCHC: 33.8 g/dL (ref 30.0–36.0)
MCV: 93.1 fL (ref 80.0–100.0)
Monocytes Absolute: 0.3 10*3/uL (ref 0.1–1.0)
Monocytes Relative: 8 %
Neutro Abs: 2.2 10*3/uL (ref 1.7–7.7)
Neutrophils Relative %: 49 %
Platelets: 209 10*3/uL (ref 150–400)
RBC: 4.48 MIL/uL (ref 4.22–5.81)
RDW: 12.8 % (ref 11.5–15.5)
WBC: 4.4 10*3/uL (ref 4.0–10.5)
nRBC: 0 % (ref 0.0–0.2)

## 2021-08-13 LAB — COMPREHENSIVE METABOLIC PANEL
ALT: 17 U/L (ref 0–44)
AST: 23 U/L (ref 15–41)
Albumin: 3.8 g/dL (ref 3.5–5.0)
Alkaline Phosphatase: 50 U/L (ref 38–126)
Anion gap: 7 (ref 5–15)
BUN: 15 mg/dL (ref 8–23)
CO2: 27 mmol/L (ref 22–32)
Calcium: 8.5 mg/dL — ABNORMAL LOW (ref 8.9–10.3)
Chloride: 105 mmol/L (ref 98–111)
Creatinine, Ser: 1.07 mg/dL (ref 0.61–1.24)
GFR, Estimated: >60 mL/min (ref >60)
Glucose, Bld: 101 mg/dL — ABNORMAL HIGH (ref 70–99)
Potassium: 4.2 mmol/L (ref 3.5–5.1)
Sodium: 139 mmol/L (ref 135–145)
Total Bilirubin: 0.5 mg/dL (ref 0.3–1.2)
Total Protein: 6.4 g/dL — ABNORMAL LOW (ref 6.5–8.1)

## 2021-08-13 LAB — TSH: TSH: 2.869 u[IU]/mL (ref 0.350–4.500)

## 2021-08-13 MED ORDER — SODIUM CHLORIDE 0.9 % IV SOLN
Freq: Once | INTRAVENOUS | Status: AC
  Filled 2021-08-13: qty 250

## 2021-08-13 MED ORDER — SODIUM CHLORIDE 0.9 % IV SOLN
200.0000 mg | Freq: Once | INTRAVENOUS | Status: AC
  Administered 2021-08-13: 200 mg via INTRAVENOUS
  Filled 2021-08-13: qty 8

## 2021-08-13 NOTE — Patient Instructions (Signed)
[facility] ONCOLOGY  Discharge Instructions: Thank you for choosing [facility] to provide your oncology and hematology care.  If you have a lab appointment with the [facility], please go directly to the [facility] and check in at the registration area.  Wear comfortable clothing and clothing appropriate for easy access to any Portacath or PICC line.   We strive to give you quality time with your provider. You may need to reschedule your appointment if you arrive late (15 or more minutes).  Arriving late affects you and other patients whose appointments are after yours.  Also, if you miss three or more appointments without notifying the office, you may be dismissed from the clinic at the provider's discretion.      For prescription refill requests, have your pharmacy contact our office and allow 72 hours for refills to be complete.   To help prevent nausea and vomiting after your treatment, we encourage you to take your nausea medication as directed.  BELOW ARE SYMPTOMS THAT SHOULD BE REPORTED IMMEDIATELY: *FEVER GREATER THAN 100.4 F (38 C) OR HIGHER *CHILLS OR SWEATING *NAUSEA AND VOMITING THAT IS NOT CONTROLLED WITH YOUR NAUSEA MEDICATION *UNUSUAL SHORTNESS OF BREATH *UNUSUAL BRUISING OR BLEEDING *URINARY PROBLEMS (pain or burning when urinating, or frequent urination) *BOWEL PROBLEMS (unusual diarrhea, constipation, pain near the anus) TENDERNESS IN MOUTH AND THROAT WITH OR WITHOUT PRESENCE OF ULCERS (sore throat, sores in mouth, or a toothache) UNUSUAL RASH, SWELLING OR PAIN  UNUSUAL VAGINAL DISCHARGE OR ITCHING   Items with * indicate a potential emergency and should be followed up as soon as possible or go to the Emergency Department if any problems should occur.  Please show the CHEMOTHERAPY ALERT CARD or IMMUNOTHERAPY ALERT CARD at check-in to the Emergency Department and triage nurse.  Should you have questions after your visit or  need to cancel or reschedule your appointment, please contact [facility]  [phone] and follow the prompts.  Office hours are 8:00 a.m. to 4:30 p.m. Monday - Friday. Please note that voicemails left after 4:00 p.m. may not be returned until the following business day.  We are closed weekends and major holidays. You have access to a nurse at all times for urgent questions. Please call the main number to the clinic [phone] and follow the prompts.  For any non-urgent questions, you may also contact your provider using MyChart. We now offer e-Visits for anyone 56 and older to request care online for non-urgent symptoms. For details visit [email or web address].   Also download the MyChart app! Go to the app store, search "MyChart", open the app, select [facility], and log in with your MyChart username and password.  Due to Covid, a mask is required upon entering the hospital/clinic. If you do not have a mask, one will be given to you upon arrival. For doctor visits, patients may have 1 support person aged 73 or older with them. For treatment visits, patients cannot have anyone with them due to current Covid guidelines and our immunocompromised population.

## 2021-08-13 NOTE — Progress Notes (Signed)
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   There is sufficient material for ancillary molecular testing if needed  (block A3, A1, A2).   Comment:  Per provided outside pathology report the patient had an "at least pT1b"  malignant melanoma of the left lateral infraclavicular skin in 2015.  Touch preparations demonstrate predominantly discohesive large  pleomorphic cells, some with binucleation.  HE sections demonstrate  lymph node parenchyma involved by metastatic neoplasm.  The neoplastic  cells are pleomorphic with binucleation, focal intranuclear inclusions,  and associated tumor necrosis. The neoplastic cells demonstrate the  following pattern of immunoreactivity:  S100: Positive  SOX10: Positive  HMB-45: Negative  Melan-A: Negative  Super pancytokeratin: Negative  CD45: Negative  This pattern of immunoreactivity supports the above diagnosis.   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  Accompanied by his wife.  [name] 65 y.o.  male pleasant  patient above history of recurrent melanoma left axillary-s/p-biopsy is here to review the results of his PET scan/proceed with immunotherapy.  Patient denies any new lumps or bumps.  Denies any nausea vomiting.   Review of Systems  Constitutional:  Negative for chills, diaphoresis, fever, malaise/fatigue and weight loss.  HENT:  Negative for nosebleeds and sore throat.   Eyes:  Negative for double vision.  Respiratory:  Negative for hemoptysis, sputum production, shortness of breath and wheezing.   Cardiovascular:  Negative for chest pain, palpitations, orthopnea and leg swelling.  Gastrointestinal:  Negative for abdominal pain, blood in stool, constipation, diarrhea, heartburn, melena, nausea and vomiting.  Genitourinary:  Negative for dysuria, frequency and urgency.  Musculoskeletal:  Negative for back pain and joint pain.  Skin: Negative.  Negative for itching and rash.  Neurological:  Negative for dizziness, tingling, focal weakness and weakness.  Endo/Heme/Allergies:  Does not bruise/bleed easily.  Psychiatric/Behavioral:  Negative for depression. The patient is not nervous/anxious and does not have insomnia.      PAST MEDICAL HISTORY :  Past Medical History:  Diagnosis Date   Basal cell carcinoma 03/10/2013   Right medial forehead. Excised, margins free.   Dysplastic nevus 11/05/2006   Mid back paraspinal, [name], close to edge. Excised 12/24/2006, margins free.   Dysplastic nevus 12/15/2008   Left lat. pectoral area. Moderate atypia, extends to one edge.    Dysplastic nevus 12/07/2009   Left mid side. Moderate atypia, close to margin.   Dysplastic nevus 12/07/2009   Right posterior waistline. Moderate atypia, close to margin.  Dysplastic nevus 02/10/2014   Left paraspinal mid back. Moderate atypia, lateral and deep margin involved.    Dysplastic nevus 06/16/2014   Left epigastric. Mild atypia, deep margin involved.    GERD  (gastroesophageal reflux disease)    Hx of basal cell carcinoma    multiple sites   Hx of malignant melanoma 11/11/2013   L lateral infraclavicular, superficial spreading, Breslow's 0.45mm, Clark's level III   Hypothyroidism    Melanoma ([place]) 11/11/2013   Left lateral infraclavicular. MM, superficial spreading. Anatomic level III. Tumor thickness 0.66mm. Excised 11/24/2013, margins free.     PAST SURGICAL HISTORY :   Past Surgical History:  Procedure Laterality Date   COLONOSCOPY WITH PROPOFOL N/A 01/20/2017   Procedure: COLONOSCOPY WITH PROPOFOL;  Surgeon: Lollie Sails, MD;  Location: [facility] ENDOSCOPY;  Service: Endoscopy;  [name];   ESOPHAGOGASTRODUODENOSCOPY (EGD) WITH ESOPHAGEAL DILATION     MELANOMA EXCISION Right 11/24/2013   lateral infraclavicular   WISDOM TOOTH EXTRACTION      FAMILY HISTORY :   Family History  Problem Relation Age of Onset   Macular degeneration Mother    Hyperlipidemia Mother    Heart disease Father    Heart attack Father    Lung cancer Father        metastasized    SOCIAL HISTORY:   Social History   Tobacco Use   Smoking status: Never   Smokeless tobacco: Never  Substance Use Topics   Alcohol use: Yes    Comment: about 1 glass of wine with dinner   Drug use: No    ALLERGIES:  has No Known Allergies.  MEDICATIONS:  Current Outpatient Medications  Medication Sig Dispense Refill   aspirin 81 MG tablet Take by mouth.     levothyroxine (SYNTHROID) 50 MCG tablet TAKE 1 TABLET BY MOUTH EVERY DAY 90 tablet 0   Multiple Vitamins tablet Take by mouth.     Multiple Vitamins-Minerals (PRESERVISION AREDS 2 PO) Take by mouth.     pantoprazole (PROTONIX) 20 MG tablet Take 1 tablet by mouth daily.  0   No current facility-administered medications for this visit.    PHYSICAL EXAMINATION: ECOG PERFORMANCE STATUS: 0 - Asymptomatic  BP 139/88   Pulse (!) 55   Temp 97.9 F (36.6 C) (Tympanic)   Resp 18   Ht 6\' 1"  (1.854 m)   Wt 192  lb 8 oz (87.3 kg)   BMI 25.40 kg/m   Filed Weights   08/13/21 0841  Weight: 192 lb 8 oz (87.3 kg)   Positive for lymph node left underarm approximate 2 to 3 cm in size.  Physical Exam Vitals and nursing note reviewed.  HENT:     Head: Normocephalic and atraumatic.     Mouth/Throat:     Pharynx: Oropharynx is clear.  Eyes:     Extraocular Movements: Extraocular movements intact.     Pupils: Pupils are equal, round, and reactive to light.  Cardiovascular:     Rate and Rhythm: Normal rate and regular rhythm.  Pulmonary:     Comments: Decreased breath sounds bilaterally.  Abdominal:     Palpations: Abdomen is soft.  Musculoskeletal:        General: Normal range of motion.     Cervical back: Normal range of motion.  Skin:    General: Skin is warm.  Neurological:     General: No focal deficit present.     Mental Status: He is alert and oriented to person, place, and time.  Psychiatric:        Behavior: Behavior normal.        Judgment: Judgment normal.       LABORATORY DATA:  I have reviewed the data as listed    Component Value Date/Time   NA 139 08/13/2021 0821   NA 145 (H) 06/26/2021 1416   K 4.2 08/13/2021 0821   CL 105 08/13/2021 0821   CO2 27 08/13/2021 0821   GLUCOSE 101 (H) 08/13/2021 0821   BUN 15 08/13/2021 0821   BUN 16 06/26/2021 1416   CREATININE 1.07 08/13/2021 0821   CALCIUM 8.5 (L) 08/13/2021 0821   PROT 6.4 (L) 08/13/2021 0821   PROT 6.7 06/26/2021 1416   ALBUMIN 3.8 08/13/2021 0821   ALBUMIN 4.6 06/26/2021 1416   AST 23 08/13/2021 0821   ALT 17 08/13/2021 0821   ALKPHOS 50 08/13/2021 0821   BILITOT 0.5 08/13/2021 0821   BILITOT 0.4 06/26/2021 1416   GFRNONAA >60 08/13/2021 0821   GFRAA 75 05/10/2020 1017    No results found for: SPEP, UPEP  Lab Results  Component Value Date   WBC 4.4 08/13/2021   NEUTROABS 2.2 08/13/2021   HGB 14.1 08/13/2021   HCT 41.7 08/13/2021   MCV 93.1 08/13/2021   PLT 209 08/13/2021      Chemistry       Component Value Date/Time   NA 139 08/13/2021 0821   NA 145 (H) 06/26/2021 1416   K 4.2 08/13/2021 0821   CL 105 08/13/2021 0821   CO2 27 08/13/2021 0821   BUN 15 08/13/2021 0821   BUN 16 06/26/2021 1416   CREATININE 1.07 08/13/2021 0821   GLU 96 10/19/2014 0000      Component Value Date/Time   CALCIUM 8.5 (L) 08/13/2021 0821   ALKPHOS 50 08/13/2021 0821   AST 23 08/13/2021 0821   ALT 17 08/13/2021 0821   BILITOT 0.5 08/13/2021 0821   BILITOT 0.4 06/26/2021 1416       RADIOGRAPHIC STUDIES: I have personally reviewed the radiological images as listed and agreed with the findings in the report. No results found.   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  S/p excisional biopsy- OCT 20th, 2022- Left axillary adenopathy with dominant lymph node measuring 2.6 x 3.4 cm, potentially partially necrotic; along with additional prominent left axillary nodes; retroperitoneal  retroperitoneal lymph nodes are not enlarged by size.  OCT 2022-PET scan-uptake in the left axillary lymph nodes.  PET scan shows no evidence of any distant metastatic disease/or uptake in the retroperitoneal lymph nodes.  #Proceed with neoadjuvant Keytruda. Labs today reviewed;  acceptable for treatment today.  Plan is to proceed with at least 3 cycles of Keytruda prior to reevaluation with axillary ultrasound of the axilla adenopathy.  Discussed with Dr. Bary Castilla.   #Again discussed with the patient and wife the potential side effects of immunotherapy including but not limited to thyroid dysfunction; pneumonitis; colitis etc.    #  DISPOSITION: # Nat Math today # follow up in 3 weeks MD; labs- cbc/cmp;Keytruda-- Dr.B  # I reviewed the blood work- with the patient in detail; also reviewed the imaging independently [as summarized above]; and with the patient in detail.    No orders of the defined types were placed in this encounter.  All  questions were answered. The patient knows to call the clinic with any problems, questions or concerns.      Cammie Sickle, MD  08/13/2021 9:22 AM

## 2021-08-13 NOTE — Assessment & Plan Note (Addendum)
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  S/p excisional biopsy- OCT 20th, 2022- Left axillary adenopathy with dominant lymph node measuring 2.6 x 3.4 cm, potentially partially necrotic; along with additional prominent left axillary nodes; retroperitoneal  retroperitoneal lymph nodes are not enlarged by size.  OCT 2022-PET scan-uptake in the left axillary lymph nodes.  PET scan shows no evidence of any distant metastatic disease/or uptake in the retroperitoneal lymph nodes.  #Proceed with neoadjuvant Keytruda. Labs today reviewed;  acceptable for treatment today.  Plan is to proceed with at least 3 cycles of Keytruda prior to reevaluation with axillary ultrasound of the axilla adenopathy.  Discussed with Dr. Bary Castilla.   #Again discussed with the patient and wife the potential side effects of immunotherapy including but not limited to thyroid dysfunction; pneumonitis; colitis etc.    #  DISPOSITION: # [name] today # follow up in 3 weeks MD; labs- cbc/cmp;Keytruda-- Dr.B  # I reviewed the blood work- with the patient in detail; also reviewed the imaging independently [as summarized above]; and with the patient in detail.

## 2021-08-14 ENCOUNTER — Telehealth: Payer: Self-pay

## 2021-08-14 NOTE — Telephone Encounter (Signed)
Telephone call to patient for follow up after receiving first infusion.   No answer but left message stating we were calling to check on them.  Encouraged patient to call for any questions or concerns.   

## 2021-08-20 ENCOUNTER — Ambulatory Visit: Payer: Medicare HMO | Admitting: Dermatology

## 2021-08-20 ENCOUNTER — Other Ambulatory Visit: Payer: Self-pay

## 2021-08-20 DIAGNOSIS — D1800 Hemangioma unspecified site: Secondary | ICD-10-CM | POA: Diagnosis not present

## 2021-08-20 DIAGNOSIS — Z1283 Encounter for screening for malignant neoplasm of skin: Secondary | ICD-10-CM | POA: Diagnosis not present

## 2021-08-20 DIAGNOSIS — L578 Other skin changes due to chronic exposure to nonionizing radiation: Secondary | ICD-10-CM

## 2021-08-20 DIAGNOSIS — C773 Secondary and unspecified malignant neoplasm of axilla and upper limb lymph nodes: Secondary | ICD-10-CM

## 2021-08-20 DIAGNOSIS — Z85828 Personal history of other malignant neoplasm of skin: Secondary | ICD-10-CM | POA: Diagnosis not present

## 2021-08-20 DIAGNOSIS — D229 Melanocytic nevi, unspecified: Secondary | ICD-10-CM

## 2021-08-20 DIAGNOSIS — C779 Secondary and unspecified malignant neoplasm of lymph node, unspecified: Secondary | ICD-10-CM

## 2021-08-20 DIAGNOSIS — L820 Inflamed seborrheic keratosis: Secondary | ICD-10-CM

## 2021-08-20 DIAGNOSIS — Z85820 Personal history of malignant melanoma of skin: Secondary | ICD-10-CM | POA: Diagnosis not present

## 2021-08-20 DIAGNOSIS — L814 Other melanin hyperpigmentation: Secondary | ICD-10-CM

## 2021-08-20 DIAGNOSIS — Z86018 Personal history of other benign neoplasm: Secondary | ICD-10-CM | POA: Diagnosis not present

## 2021-08-20 DIAGNOSIS — L821 Other seborrheic keratosis: Secondary | ICD-10-CM

## 2021-08-20 DIAGNOSIS — L570 Actinic keratosis: Secondary | ICD-10-CM | POA: Diagnosis not present

## 2021-08-20 NOTE — Patient Instructions (Signed)

## 2021-08-20 NOTE — Progress Notes (Signed)
Follow-Up Visit   Subjective  [name] is a 65 y.o. male who presents for the following: Annual Exam (History Melanoma (left lat infraclavicular 11/11/2013) - TBSE today. He was recently diagnosed with metastatic melanoma in his lymph nodes (left axillary). He is being treated with Keytruda.).  The following portions of the chart were reviewed this encounter and updated as appropriate:   Tobacco  Allergies  Meds  Problems  Med Hx  Surg Hx  Fam Hx     Review of Systems:  No other skin or systemic complaints except as noted in HPI or Assessment and Plan.  Objective  Well appearing patient in no apparent distress; mood and affect are within normal limits.  A full examination was performed including scalp, head, eyes, ears, nose, lips, neck, chest, axillae, abdomen, back, buttocks, bilateral upper extremities, bilateral lower extremities, hands, feet, fingers, toes, fingernails, and toenails. All findings within normal limits unless otherwise noted below.  Left Axilla Nodule  Scalp/forehead (17) Erythematous thin papules/macules with gritty scale.   Right Upper Eyelid x 1, left upper back x 1, left flank x 4 (6) Erythematous keratotic or waxy stuck-on papule or plaque.    Assessment & Plan   History of Melanoma - Left lateral infraclavicular in February 2015; S/P Wide Local excision - clear with close follow up for past 7 years.  Patient met all recommended treatment protocol and follow up. Recent discovery of symptomatic lump in Left axilla showing Melanoma by biopsy. - No evidence of recurrence today at excision site. - + Lymphadenopathy L axilla today - Recommend regular full body skin exams - Recommend daily broad spectrum sunscreen SPF 30+ to sun-exposed areas, reapply every 2 hours as needed.  - Call if any new or changing lesions are noted between office visits  Metastatic melanoma to lymph node ([place]) Left Axilla Reviewed records today. PET scan does not show  metastases anywhere other than left axillary lymph nodes. MRI of brain is clear.  CT of Chest; abdomen and pelvis is clear except for Left axilla lymphadenopathy. He is being treated by Dr. Burlene Arnt with [name] now and will plan to do at least 3 cycles of Keytruda before ultrasound re-evaluation.  History of Basal Cell Carcinoma of the Skin - No evidence of recurrence today - Recommend regular full body skin exams - Recommend daily broad spectrum sunscreen SPF 30+ to sun-exposed areas, reapply every 2 hours as needed.  - Call if any new or changing lesions are noted between office visits  History of Dysplastic Nevi - No evidence of recurrence today - Recommend regular full body skin exams - Recommend daily broad spectrum sunscreen SPF 30+ to sun-exposed areas, reapply every 2 hours as needed.  - Call if any new or changing lesions are noted between office visits  Lentigines - Scattered tan macules - Due to sun exposure - Benign-appearing, observe - Recommend daily broad spectrum sunscreen SPF 30+ to sun-exposed areas, reapply every 2 hours as needed. - Call for any changes  Seborrheic Keratoses - Stuck-on, waxy, tan-brown papules and/or plaques  - Benign-appearing - Discussed benign etiology and prognosis. - Observe - Call for any changes  Melanocytic Nevi - Tan-brown and/or pink-flesh-colored symmetric macules and papules - Benign appearing on exam today - Observation - Call clinic for new or changing moles - Recommend daily use of broad spectrum spf 30+ sunscreen to sun-exposed areas.   Hemangiomas - Red papules - Discussed benign nature - Observe - Call for any changes  Actinic Damage - Chronic condition, secondary to cumulative UV/sun exposure - diffuse scaly erythematous macules with underlying dyspigmentation - Recommend daily broad spectrum sunscreen SPF 30+ to sun-exposed areas, reapply every 2 hours as needed.  - Staying in the shade or wearing long sleeves,  sun glasses (UVA+UVB protection) and wide brim hats (4-inch brim around the entire circumference of the hat) are also recommended for sun protection.  - Call for new or changing lesions.  Skin cancer screening performed today.  AK (actinic keratosis) (17) Scalp/forehead Destruction of lesion - Scalp/forehead Complexity: simple   Destruction method: cryotherapy   Informed consent: discussed and consent obtained   Timeout:  patient name, date of birth, surgical site, and procedure verified Lesion destroyed using liquid nitrogen: Yes   Region frozen until ice ball extended beyond lesion: Yes   Outcome: patient tolerated procedure well with no complications   Post-procedure details: wound care instructions given    Inflamed seborrheic keratosis Right Upper Eyelid x 1, left upper back x 1, left flank x 4 Destruction of lesion - Right Upper Eyelid x 1, left upper back x 1, left flank x 4 Complexity: simple   Destruction method: cryotherapy   Informed consent: discussed and consent obtained   Timeout:  patient name, date of birth, surgical site, and procedure verified Lesion destroyed using liquid nitrogen: Yes   Region frozen until ice ball extended beyond lesion: Yes   Outcome: patient tolerated procedure well with no complications   Post-procedure details: wound care instructions given    Return in about 3 months (around 11/20/2021).  I, Ashok Cordia, CMA, am acting as scribe for Sarina Ser, MD . Documentation: I have reviewed the above documentation for accuracy and completeness, and I agree with the above.  Sarina Ser, MD

## 2021-08-21 DIAGNOSIS — M9903 Segmental and somatic dysfunction of lumbar region: Secondary | ICD-10-CM | POA: Diagnosis not present

## 2021-08-21 DIAGNOSIS — M62830 Muscle spasm of back: Secondary | ICD-10-CM | POA: Diagnosis not present

## 2021-08-21 DIAGNOSIS — M9901 Segmental and somatic dysfunction of cervical region: Secondary | ICD-10-CM | POA: Diagnosis not present

## 2021-08-21 DIAGNOSIS — M5033 Other cervical disc degeneration, cervicothoracic region: Secondary | ICD-10-CM | POA: Diagnosis not present

## 2021-08-23 ENCOUNTER — Other Ambulatory Visit: Payer: Self-pay | Admitting: Family Medicine

## 2021-08-23 NOTE — Telephone Encounter (Signed)
Requested Prescriptions  Pending Prescriptions Disp Refills  . levothyroxine (SYNTHROID) 50 MCG tablet [Pharmacy Med Name: LEVOTHYROXINE 50 MCG TABLET] 90 tablet 1    Sig: TAKE 1 TABLET BY MOUTH EVERY DAY     Endocrinology:  Hypothyroid Agents Failed - 08/23/2021  1:51 AM      Failed - TSH needs to be rechecked within 3 months after an abnormal result. Refill until TSH is due.      Passed - TSH in normal range and within 360 days    TSH  Date Value Ref Range Status  08/13/2021 2.869 0.350 - 4.500 uIU/mL Final    Comment:    Performed by a 3rd Generation assay with a functional sensitivity of <=0.01 uIU/mL. Performed at [facility], [address] [place], [place] [address]   05/15/2021 2.990 0.450 - 4.500 uIU/mL Final         Passed - Valid encounter within last 12 months    Recent Outpatient Visits          1 month ago Cough   [facility] Jerrol Banana., MD   3 months ago Medicare annual wellness visit, subsequent   [facility] Jerrol Banana., MD   5 months ago Acute allergic reaction, initial encounter   [facility] Charlynne Cousins, MD   1 year ago Annual physical exam   [facility] Jerrol Banana., MD   2 years ago Adult hypothyroidism   [facility] Jerrol Banana., MD      Future Appointments            In 3 months Ralene Bathe, MD [place]

## 2021-08-26 ENCOUNTER — Encounter: Payer: Self-pay | Admitting: Dermatology

## 2021-09-03 ENCOUNTER — Inpatient Hospital Stay: Payer: Medicare HMO

## 2021-09-03 ENCOUNTER — Other Ambulatory Visit: Payer: Self-pay

## 2021-09-03 ENCOUNTER — Inpatient Hospital Stay: Payer: Medicare HMO | Admitting: Internal Medicine

## 2021-09-03 ENCOUNTER — Encounter: Payer: Self-pay | Admitting: Internal Medicine

## 2021-09-03 DIAGNOSIS — Z7982 Long term (current) use of aspirin: Secondary | ICD-10-CM | POA: Insufficient documentation

## 2021-09-03 DIAGNOSIS — C439 Malignant melanoma of skin, unspecified: Secondary | ICD-10-CM

## 2021-09-03 DIAGNOSIS — C4359 Malignant melanoma of other part of trunk: Secondary | ICD-10-CM | POA: Insufficient documentation

## 2021-09-03 DIAGNOSIS — C773 Secondary and unspecified malignant neoplasm of axilla and upper limb lymph nodes: Secondary | ICD-10-CM | POA: Insufficient documentation

## 2021-09-03 DIAGNOSIS — Z801 Family history of malignant neoplasm of trachea, bronchus and lung: Secondary | ICD-10-CM | POA: Diagnosis not present

## 2021-09-03 DIAGNOSIS — Z79899 Other long term (current) drug therapy: Secondary | ICD-10-CM | POA: Diagnosis not present

## 2021-09-03 DIAGNOSIS — E039 Hypothyroidism, unspecified: Secondary | ICD-10-CM | POA: Insufficient documentation

## 2021-09-03 DIAGNOSIS — C779 Secondary and unspecified malignant neoplasm of lymph node, unspecified: Secondary | ICD-10-CM

## 2021-09-03 DIAGNOSIS — Z5112 Encounter for antineoplastic immunotherapy: Secondary | ICD-10-CM | POA: Diagnosis not present

## 2021-09-03 LAB — CBC WITH DIFFERENTIAL/PLATELET
Abs Immature Granulocytes: 0.02 10*3/uL (ref 0.00–0.07)
Basophils Absolute: 0 10*3/uL (ref 0.0–0.1)
Basophils Relative: 1 %
Eosinophils Absolute: 0.2 10*3/uL (ref 0.0–0.5)
Eosinophils Relative: 4 %
HCT: 43.3 % (ref 39.0–52.0)
Hemoglobin: 14.4 g/dL (ref 13.0–17.0)
Immature Granulocytes: 0 %
Lymphocytes Relative: 35 %
Lymphs Abs: 1.7 10*3/uL (ref 0.7–4.0)
MCH: 30.8 pg (ref 26.0–34.0)
MCHC: 33.3 g/dL (ref 30.0–36.0)
MCV: 92.7 fL (ref 80.0–100.0)
Monocytes Absolute: 0.4 10*3/uL (ref 0.1–1.0)
Monocytes Relative: 9 %
Neutro Abs: 2.5 10*3/uL (ref 1.7–7.7)
Neutrophils Relative %: 51 %
Platelets: 236 10*3/uL (ref 150–400)
RBC: 4.67 MIL/uL (ref 4.22–5.81)
RDW: 12.8 % (ref 11.5–15.5)
WBC: 4.8 10*3/uL (ref 4.0–10.5)
nRBC: 0 % (ref 0.0–0.2)

## 2021-09-03 LAB — COMPREHENSIVE METABOLIC PANEL
ALT: 16 U/L (ref 0–44)
AST: 18 U/L (ref 15–41)
Albumin: 3.7 g/dL (ref 3.5–5.0)
Alkaline Phosphatase: 46 U/L (ref 38–126)
Anion gap: 4 — ABNORMAL LOW (ref 5–15)
BUN: 19 mg/dL (ref 8–23)
CO2: 30 mmol/L (ref 22–32)
Calcium: 8.9 mg/dL (ref 8.9–10.3)
Chloride: 105 mmol/L (ref 98–111)
Creatinine, Ser: 1.2 mg/dL (ref 0.61–1.24)
GFR, Estimated: >60 mL/min (ref >60)
Glucose, Bld: 84 mg/dL (ref 70–99)
Potassium: 4.1 mmol/L (ref 3.5–5.1)
Sodium: 139 mmol/L (ref 135–145)
Total Bilirubin: 0.7 mg/dL (ref 0.3–1.2)
Total Protein: 6.1 g/dL — ABNORMAL LOW (ref 6.5–8.1)

## 2021-09-03 LAB — TSH: TSH: 2.869 u[IU]/mL (ref 0.350–4.500)

## 2021-09-03 MED ORDER — SODIUM CHLORIDE 0.9 % IV SOLN
200.0000 mg | Freq: Once | INTRAVENOUS | Status: AC
  Administered 2021-09-03: 11:00:00 200 mg via INTRAVENOUS
  Filled 2021-09-03: qty 8

## 2021-09-03 MED ORDER — SODIUM CHLORIDE 0.9 % IV SOLN
Freq: Once | INTRAVENOUS | Status: AC
  Filled 2021-09-03: qty 250

## 2021-09-03 NOTE — Assessment & Plan Note (Addendum)
#  Recurrent/metastatic melanoma to the left axilla-STAGEIII.  OCT 20th, 2022- Left axillary adenopathy with dominant lymph node measuring 2.6 x 3.4 cm, potentially partially necrotic; along with additional prominent left axillary nodes; retroperitoneal  retroperitoneal lymph nodes are not enlarged by size.  OCT 2022-PET scan-uptake in the left axillary lymph nodes.  PET scan shows no evidence of any distant metastatic disease/or uptake in the retroperitoneal lymph nodes. Currently on neoadjuvant single agent Keytruda.   # proceed with with [place] #2 today. Labs today reviewed;  acceptable for treatment today.  Plan is to proceed with at least 3 cycles of Keytruda or more based on axillary reevaluation.  Plan is to proceed with reevaluation with ultrasound with Dr. Bary Castilla prior to cycle #4.   #  DISPOSITION: # Nat Math today # follow up in 3 weeks MD; labs- cbc/cmp;Keytruda-- Dr.B

## 2021-09-03 NOTE — Progress Notes (Signed)
MD reviewed labs. Per MD to proceed with treatment.   [name] [employer]

## 2021-09-03 NOTE — Progress Notes (Signed)
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   There is sufficient material for ancillary molecular testing if needed  (block A3, A1, A2).   Comment:  Per provided outside pathology report the patient had an "at least pT1b"  malignant melanoma of the left lateral infraclavicular skin in 2015.  Touch preparations demonstrate predominantly discohesive large  pleomorphic cells, some with binucleation.  HE sections demonstrate  lymph node parenchyma involved by metastatic neoplasm.  The neoplastic  cells are pleomorphic with binucleation, focal intranuclear inclusions,  and associated tumor necrosis. The neoplastic cells demonstrate the  following pattern of immunoreactivity:  S100: Positive  SOX10: Positive  HMB-45: Negative  Melan-A: Negative  Super pancytokeratin: Negative  CD45: Negative  This pattern of immunoreactivity supports the above diagnosis.   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  Accompanied by his wife.  [name] 65 y.o.  male pleasant  patient above history of recurrent melanoma left axillary lymphadenopathy-currently on neoadjuvant [name] is here for follow-up.  Denies any nausea vomiting abdominal pain.  Denies any diarrhea.  Denies any skin rash.   Review of Systems  Constitutional:  Negative for chills, diaphoresis, fever, malaise/fatigue and weight loss.  HENT:  Negative for nosebleeds and sore throat.   Eyes:  Negative for double vision.  Respiratory:  Negative for hemoptysis, sputum production, shortness of breath and wheezing.   Cardiovascular:  Negative for chest pain, palpitations, orthopnea and leg swelling.  Gastrointestinal:  Negative for abdominal pain, blood in stool, constipation, diarrhea, heartburn, melena, nausea and vomiting.  Genitourinary:  Negative for dysuria, frequency and urgency.  Musculoskeletal:  Negative for back pain and joint pain.  Skin: Negative.  Negative for itching and rash.  Neurological:  Negative for dizziness, tingling, focal weakness and weakness.  Endo/Heme/Allergies:  Does not bruise/bleed easily.  Psychiatric/Behavioral:  Negative for depression. The patient is not nervous/anxious and does not have insomnia.      PAST MEDICAL HISTORY :  Past Medical History:  Diagnosis Date   Basal cell carcinoma 03/10/2013   Right medial forehead. Excised, margins free.   Dysplastic nevus 11/05/2006   Mid back paraspinal, [name], close to edge. Excised 12/24/2006, margins free.   Dysplastic nevus 12/15/2008   Left lat. pectoral area. Moderate atypia, extends to one edge.    Dysplastic nevus 12/07/2009   Left mid side. Moderate atypia, close to margin.   Dysplastic nevus 12/07/2009   Right posterior waistline. Moderate atypia, close to margin.  Dysplastic nevus 02/10/2014   Left paraspinal mid back. Moderate atypia, lateral and deep margin involved.    Dysplastic nevus 06/16/2014   Left epigastric. Mild atypia, deep margin involved.    GERD  (gastroesophageal reflux disease)    Hx of basal cell carcinoma    multiple sites   Hx of malignant melanoma 11/11/2013   L lateral infraclavicular, superficial spreading, Breslow's 0.8mm, Clark's level III   Hypothyroidism    Melanoma ([place]) 11/11/2013   Left lateral infraclavicular. MM, superficial spreading. Anatomic level III. Tumor thickness 0.70mm. Excised 11/24/2013, margins free.     PAST SURGICAL HISTORY :   Past Surgical History:  Procedure Laterality Date   COLONOSCOPY WITH PROPOFOL N/A 01/20/2017   Procedure: COLONOSCOPY WITH PROPOFOL;  Surgeon: Lollie Sails, MD;  Location: [facility] ENDOSCOPY;  Service: Endoscopy;  [name];   ESOPHAGOGASTRODUODENOSCOPY (EGD) WITH ESOPHAGEAL DILATION     MELANOMA EXCISION Right 11/24/2013   lateral infraclavicular   WISDOM TOOTH EXTRACTION      FAMILY HISTORY :   Family History  Problem Relation Age of Onset   Macular degeneration Mother    Hyperlipidemia Mother    Heart disease Father    Heart attack Father    Lung cancer Father        metastasized    SOCIAL HISTORY:   Social History   Tobacco Use   Smoking status: Never   Smokeless tobacco: Never  Substance Use Topics   Alcohol use: Yes    Comment: about 1 glass of wine with dinner   Drug use: No    ALLERGIES:  has No Known Allergies.  MEDICATIONS:  Current Outpatient Medications  Medication Sig Dispense Refill   aspirin 81 MG tablet Take by mouth.     levothyroxine (SYNTHROID) 50 MCG tablet TAKE 1 TABLET BY MOUTH EVERY DAY 90 tablet 1   Multiple Vitamins tablet Take by mouth.     Multiple Vitamins-Minerals (PRESERVISION AREDS 2 PO) Take by mouth.     pantoprazole (PROTONIX) 20 MG tablet Take 1 tablet by mouth daily.  0   No current facility-administered medications for this visit.    PHYSICAL EXAMINATION: ECOG PERFORMANCE STATUS: 0 - Asymptomatic  BP 130/82   Pulse 63   Temp 97.8 F (36.6 C)   Resp 20   Wt 193 lb 3.2 oz (87.6 kg)   SpO2 100%    BMI 25.49 kg/m   Filed Weights   09/03/21 0902  Weight: 193 lb 3.2 oz (87.6 kg)   Positive for lymph node left underarm approximate 2 to 3 cm in size.  Physical Exam Vitals and nursing note reviewed.  HENT:     Head: Normocephalic and atraumatic.     Mouth/Throat:     Pharynx: Oropharynx is clear.  Eyes:     Extraocular Movements: Extraocular movements intact.     Pupils: Pupils are equal, round, and reactive to light.  Cardiovascular:     Rate and Rhythm: Normal rate and regular rhythm.  Pulmonary:     Comments: Decreased breath sounds bilaterally.  Abdominal:     Palpations: Abdomen is soft.  Musculoskeletal:        General: Normal range of motion.     Cervical back: Normal range of motion.  Skin:    General: Skin is warm.  Neurological:     General: No focal deficit present.     Mental Status: He is alert and oriented to person, place, and time.  Psychiatric:  Behavior: Behavior normal.        Judgment: Judgment normal.       LABORATORY DATA:  I have reviewed the data as listed    Component Value Date/Time   NA 139 08/13/2021 0821   NA 145 (H) 06/26/2021 1416   K 4.2 08/13/2021 0821   CL 105 08/13/2021 0821   CO2 27 08/13/2021 0821   GLUCOSE 101 (H) 08/13/2021 0821   BUN 15 08/13/2021 0821   BUN 16 06/26/2021 1416   CREATININE 1.07 08/13/2021 0821   CALCIUM 8.5 (L) 08/13/2021 0821   PROT 6.4 (L) 08/13/2021 0821   PROT 6.7 06/26/2021 1416   ALBUMIN 3.8 08/13/2021 0821   ALBUMIN 4.6 06/26/2021 1416   AST 23 08/13/2021 0821   ALT 17 08/13/2021 0821   ALKPHOS 50 08/13/2021 0821   BILITOT 0.5 08/13/2021 0821   BILITOT 0.4 06/26/2021 1416   GFRNONAA >60 08/13/2021 0821   GFRAA 75 05/10/2020 1017    No results found for: SPEP, UPEP  Lab Results  Component Value Date   WBC 4.4 08/13/2021   NEUTROABS 2.2 08/13/2021   HGB 14.1 08/13/2021   HCT 41.7 08/13/2021   MCV 93.1 08/13/2021   PLT 209 08/13/2021      Chemistry      Component Value  Date/Time   NA 139 08/13/2021 0821   NA 145 (H) 06/26/2021 1416   K 4.2 08/13/2021 0821   CL 105 08/13/2021 0821   CO2 27 08/13/2021 0821   BUN 15 08/13/2021 0821   BUN 16 06/26/2021 1416   CREATININE 1.07 08/13/2021 0821   GLU 96 10/19/2014 0000      Component Value Date/Time   CALCIUM 8.5 (L) 08/13/2021 0821   ALKPHOS 50 08/13/2021 0821   AST 23 08/13/2021 0821   ALT 17 08/13/2021 0821   BILITOT 0.5 08/13/2021 0821   BILITOT 0.4 06/26/2021 1416       RADIOGRAPHIC STUDIES: I have personally reviewed the radiological images as listed and agreed with the findings in the report. No results found.   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  OCT 20th, 2022- Left axillary adenopathy with dominant lymph node measuring 2.6 x 3.4 cm, potentially partially necrotic; along with additional prominent left axillary nodes; retroperitoneal  retroperitoneal lymph nodes are not enlarged by size.  OCT 2022-PET scan-uptake in the left axillary lymph nodes.  PET scan shows no evidence of any distant metastatic disease/or uptake in the retroperitoneal lymph nodes. Currently on neoadjuvant single agent Keytruda.   # proceed with with [place] #2 today. Labs today reviewed;  acceptable for treatment today.  Plan is to proceed with at least 3 cycles of Keytruda or more based on axillary reevaluation.  Plan is to proceed with reevaluation with ultrasound with Dr. Bary Castilla prior to cycle #4.   #  DISPOSITION: # Nat Math today # follow up in 3 weeks MD; labs- cbc/cmp;Keytruda-- Dr.B    Orders Placed This Encounter  Procedures   CBC with Differential/Platelet    Standing Status:   Future    Standing Expiration Date:   09/03/2022   Comprehensive metabolic panel    Standing Status:   Future    Standing Expiration Date:   09/03/2022    All questions were answered. The patient knows to call the clinic with any  problems, questions or concerns.      Cammie Sickle, MD 09/03/2021 10:06 AM

## 2021-09-03 NOTE — Patient Instructions (Signed)
[facility] [place] [facility]  Discharge Instructions: Thank you for choosing [facility] to provide your oncology and hematology care.  If you have a lab appointment with the Cancer Center, please go directly to the Cancer Center and check in at the registration area.  Wear comfortable clothing and clothing appropriate for easy access to any Portacath or PICC line.   We strive to give you quality time with your provider. You may need to reschedule your appointment if you arrive late (15 or more minutes).  Arriving late affects you and other patients whose appointments are after yours.  Also, if you miss three or more appointments without notifying the office, you may be dismissed from the clinic at the provider's discretion.      For prescription refill requests, have your pharmacy contact our office and allow 72 hours for refills to be completed.    Today you received the following chemotherapy and/or immunotherapy agents keytruda   To help prevent nausea and vomiting after your treatment, we encourage you to take your nausea medication as directed.  BELOW ARE SYMPTOMS THAT SHOULD BE REPORTED IMMEDIATELY: *FEVER GREATER THAN 100.4 F (38 C) OR HIGHER *CHILLS OR SWEATING *NAUSEA AND VOMITING THAT IS NOT CONTROLLED WITH YOUR NAUSEA MEDICATION *UNUSUAL SHORTNESS OF BREATH *UNUSUAL BRUISING OR BLEEDING *URINARY PROBLEMS (pain or burning when urinating, or frequent urination) *BOWEL PROBLEMS (unusual diarrhea, constipation, pain near the anus) TENDERNESS IN MOUTH AND THROAT WITH OR WITHOUT PRESENCE OF ULCERS (sore throat, sores in mouth, or a toothache) UNUSUAL RASH, SWELLING OR PAIN  UNUSUAL VAGINAL DISCHARGE OR ITCHING   Items with * indicate a potential emergency and should be followed up as soon as possible or go to the Emergency Department if any problems should occur.  Please show the CHEMOTHERAPY ALERT CARD or IMMUNOTHERAPY ALERT CARD at check-in to  the Emergency Department and triage nurse.  Should you have questions after your visit or need to cancel or reschedule your appointment, please contact [facility] [place] [facility]  [phone] and follow the prompts.  Office hours are 8:00 a.m. to 4:30 p.m. Monday - Friday. Please note that voicemails left after 4:00 p.m. may not be returned until the following business day.  We are closed weekends and major holidays. You have access to a nurse at all times for urgent questions. Please call the main number to the clinic [phone] and follow the prompts.  For any non-urgent questions, you may also contact your provider using MyChart. We now offer e-Visits for anyone 18 and older to request care online for non-urgent symptoms. For details visit [email or web address].   Also download the MyChart app! Go to the app store, search "MyChart", open the app, select [facility], and log in with your MyChart username and password.  Due to Covid, a mask is required upon entering the hospital/clinic. If you do not have a mask, one will be given to you upon arrival. For doctor visits, patients may have 1 support person aged 18 or older with them. For treatment visits, patients cannot have anyone with them due to current Covid guidelines and our immunocompromised population.  

## 2021-09-12 ENCOUNTER — Encounter: Payer: Medicare HMO | Admitting: Dermatology

## 2021-09-18 DIAGNOSIS — M62830 Muscle spasm of back: Secondary | ICD-10-CM | POA: Diagnosis not present

## 2021-09-18 DIAGNOSIS — M9901 Segmental and somatic dysfunction of cervical region: Secondary | ICD-10-CM | POA: Diagnosis not present

## 2021-09-18 DIAGNOSIS — M5033 Other cervical disc degeneration, cervicothoracic region: Secondary | ICD-10-CM | POA: Diagnosis not present

## 2021-09-18 DIAGNOSIS — M9903 Segmental and somatic dysfunction of lumbar region: Secondary | ICD-10-CM | POA: Diagnosis not present

## 2021-09-22 ENCOUNTER — Other Ambulatory Visit: Payer: Self-pay

## 2021-09-24 ENCOUNTER — Inpatient Hospital Stay: Payer: Medicare HMO | Admitting: Oncology

## 2021-09-24 ENCOUNTER — Other Ambulatory Visit: Payer: Self-pay

## 2021-09-24 ENCOUNTER — Encounter: Payer: Self-pay | Admitting: Oncology

## 2021-09-24 ENCOUNTER — Inpatient Hospital Stay: Payer: Medicare HMO

## 2021-09-24 ENCOUNTER — Inpatient Hospital Stay: Payer: Medicare HMO | Attending: Oncology

## 2021-09-24 VITALS — BP 145/92 | HR 65 | Temp 97.1°F | Resp 18 | Wt 193.7 lb

## 2021-09-24 DIAGNOSIS — Z5112 Encounter for antineoplastic immunotherapy: Secondary | ICD-10-CM | POA: Insufficient documentation

## 2021-09-24 DIAGNOSIS — C439 Malignant melanoma of skin, unspecified: Secondary | ICD-10-CM

## 2021-09-24 DIAGNOSIS — C779 Secondary and unspecified malignant neoplasm of lymph node, unspecified: Secondary | ICD-10-CM

## 2021-09-24 DIAGNOSIS — C773 Secondary and unspecified malignant neoplasm of axilla and upper limb lymph nodes: Secondary | ICD-10-CM | POA: Diagnosis not present

## 2021-09-24 DIAGNOSIS — C4359 Malignant melanoma of other part of trunk: Secondary | ICD-10-CM | POA: Diagnosis not present

## 2021-09-24 LAB — COMPREHENSIVE METABOLIC PANEL
ALT: 17 U/L (ref 0–44)
AST: 22 U/L (ref 15–41)
Albumin: 3.9 g/dL (ref 3.5–5.0)
Alkaline Phosphatase: 46 U/L (ref 38–126)
Anion gap: 6 (ref 5–15)
BUN: 21 mg/dL (ref 8–23)
CO2: 27 mmol/L (ref 22–32)
Calcium: 8.8 mg/dL — ABNORMAL LOW (ref 8.9–10.3)
Chloride: 104 mmol/L (ref 98–111)
Creatinine, Ser: 1.07 mg/dL (ref 0.61–1.24)
GFR, Estimated: >60 mL/min (ref >60)
Glucose, Bld: 84 mg/dL (ref 70–99)
Potassium: 4.1 mmol/L (ref 3.5–5.1)
Sodium: 137 mmol/L (ref 135–145)
Total Bilirubin: 0.8 mg/dL (ref 0.3–1.2)
Total Protein: 6.3 g/dL — ABNORMAL LOW (ref 6.5–8.1)

## 2021-09-24 LAB — CBC WITH DIFFERENTIAL/PLATELET
Abs Immature Granulocytes: 0.02 10*3/uL (ref 0.00–0.07)
Basophils Absolute: 0.1 10*3/uL (ref 0.0–0.1)
Basophils Relative: 1 %
Eosinophils Absolute: 0.2 10*3/uL (ref 0.0–0.5)
Eosinophils Relative: 3 %
HCT: 43.8 % (ref 39.0–52.0)
Hemoglobin: 14.7 g/dL (ref 13.0–17.0)
Immature Granulocytes: 0 %
Lymphocytes Relative: 31 %
Lymphs Abs: 1.5 10*3/uL (ref 0.7–4.0)
MCH: 30.9 pg (ref 26.0–34.0)
MCHC: 33.6 g/dL (ref 30.0–36.0)
MCV: 92 fL (ref 80.0–100.0)
Monocytes Absolute: 0.4 10*3/uL (ref 0.1–1.0)
Monocytes Relative: 9 %
Neutro Abs: 2.6 10*3/uL (ref 1.7–7.7)
Neutrophils Relative %: 56 %
Platelets: 219 10*3/uL (ref 150–400)
RBC: 4.76 MIL/uL (ref 4.22–5.81)
RDW: 13 % (ref 11.5–15.5)
WBC: 4.8 10*3/uL (ref 4.0–10.5)
nRBC: 0 % (ref 0.0–0.2)

## 2021-09-24 MED ORDER — SODIUM CHLORIDE 0.9 % IV SOLN
Freq: Once | INTRAVENOUS | Status: AC
  Filled 2021-09-24: qty 250

## 2021-09-24 MED ORDER — SODIUM CHLORIDE 0.9 % IV SOLN
200.0000 mg | Freq: Once | INTRAVENOUS | Status: DC
  Filled 2021-09-24: qty 8

## 2021-09-24 MED ORDER — SODIUM CHLORIDE 0.9 % IV SOLN
200.0000 mg | Freq: Once | INTRAVENOUS | Status: AC
  Administered 2021-09-24: 11:00:00 200 mg via INTRAVENOUS
  Filled 2021-09-24: qty 8

## 2021-09-24 NOTE — Progress Notes (Signed)
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   There is sufficient material for ancillary molecular testing if needed  (block A3, A1, A2).   Comment:  Per provided outside pathology report the patient had an "at least pT1b"  malignant melanoma of the left lateral infraclavicular skin in 2015.  Touch preparations demonstrate predominantly discohesive large  pleomorphic cells, some with binucleation.  HE sections demonstrate  lymph node parenchyma involved by metastatic neoplasm.  The neoplastic  cells are pleomorphic with binucleation, focal intranuclear inclusions,  and associated tumor necrosis. The neoplastic cells demonstrate the  following pattern of immunoreactivity:  S100: Positive  SOX10: Positive  HMB-45: Negative  Melan-A: Negative  Super pancytokeratin: Negative  CD45: Negative  This pattern of immunoreactivity supports the above diagnosis.   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  Accompanied by his wife.  [name] 65 y.o.  male pleasant  patient above history of recurrent melanoma left axillary lymphadenopathy-currently on neoadjuvant [name] is here for follow-up. Patient reports feeling well. Tolerating immunotherapy. No cough, SOB, diarrhea, rash.   Denies any nausea vomiting abdominal pain.    Review of Systems  Constitutional:  Negative for chills, diaphoresis, fever, malaise/fatigue and weight loss.  HENT:  Negative for nosebleeds and sore throat.   Eyes:  Negative for double vision.  Respiratory:  Negative for hemoptysis, sputum production, shortness of breath and wheezing.   Cardiovascular:  Negative for chest pain, palpitations, orthopnea and leg swelling.  Gastrointestinal:  Negative for abdominal pain, blood in stool, constipation, diarrhea, heartburn, melena, nausea and vomiting.  Genitourinary:  Negative for dysuria, frequency and urgency.  Musculoskeletal:  Negative for back pain and joint pain.  Skin: Negative.  Negative for itching and rash.  Neurological:  Negative for dizziness, tingling, focal weakness and weakness.  Endo/Heme/Allergies:  Does not bruise/bleed easily.  Psychiatric/Behavioral:  Negative for depression. The patient is not nervous/anxious and does not have insomnia.      PAST MEDICAL HISTORY :  Past Medical History:  Diagnosis Date   Basal cell carcinoma 03/10/2013   Right medial forehead. Excised, margins free.   Dysplastic nevus 11/05/2006   Mid back paraspinal, [name], close to edge. Excised 12/24/2006, margins free.   Dysplastic nevus 12/15/2008   Left lat. pectoral area. Moderate atypia, extends to one edge.    Dysplastic nevus 12/07/2009   Left mid side. Moderate atypia, close to margin.   Dysplastic nevus 12/07/2009   Right posterior waistline. Moderate atypia, close  to margin.   Dysplastic nevus 02/10/2014   Left paraspinal mid back. Moderate atypia, lateral and deep margin involved.    Dysplastic nevus 06/16/2014   Left epigastric.  Mild atypia, deep margin involved.    GERD (gastroesophageal reflux disease)    Hx of basal cell carcinoma    multiple sites   Hx of malignant melanoma 11/11/2013   L lateral infraclavicular, superficial spreading, Breslow's 0.70mm, Clark's level III   Hypothyroidism    Melanoma ([place]) 11/11/2013   Left lateral infraclavicular. MM, superficial spreading. Anatomic level III. Tumor thickness 0.61mm. Excised 11/24/2013, margins free.     PAST SURGICAL HISTORY :   Past Surgical History:  Procedure Laterality Date   COLONOSCOPY WITH PROPOFOL N/A 01/20/2017   Procedure: COLONOSCOPY WITH PROPOFOL;  Surgeon: Lollie Sails, MD;  Location: [facility] ENDOSCOPY;  Service: Endoscopy;  [name];   ESOPHAGOGASTRODUODENOSCOPY (EGD) WITH ESOPHAGEAL DILATION     MELANOMA EXCISION Right 11/24/2013   lateral infraclavicular   WISDOM TOOTH EXTRACTION      FAMILY HISTORY :   Family History  Problem Relation Age of Onset   Macular degeneration Mother    Hyperlipidemia Mother    Heart disease Father    Heart attack Father    Lung cancer Father        metastasized    SOCIAL HISTORY:   Social History   Tobacco Use   Smoking status: Never   Smokeless tobacco: Never  Substance Use Topics   Alcohol use: Yes    Comment: about 1 glass of wine with dinner   Drug use: No    ALLERGIES:  has No Known Allergies.  MEDICATIONS:  Current Outpatient Medications  Medication Sig Dispense Refill   aspirin 81 MG tablet Take by mouth.     levothyroxine (SYNTHROID) 50 MCG tablet TAKE 1 TABLET BY MOUTH EVERY DAY 90 tablet 1   Multiple Vitamins tablet Take by mouth.     Multiple Vitamins-Minerals (PRESERVISION AREDS 2 PO) Take by mouth.     pantoprazole (PROTONIX) 20 MG tablet Take 1 tablet by mouth daily.  0   No current facility-administered medications for this visit.    PHYSICAL EXAMINATION: ECOG PERFORMANCE STATUS: 0 - Asymptomatic  BP (!) 145/92 (BP Location: Right Arm, Patient Position:  Sitting)    Pulse 65    Temp (!) 97.1 F (36.2 C)    Resp 18    Wt 193 lb 11.2 oz (87.9 kg)    BMI 25.56 kg/m   Filed Weights   09/24/21 0929  Weight: 193 lb 11.2 oz (87.9 kg)   Positive for lymph node left underarm approximate 2 to 3 cm in size.  Physical Exam Vitals and nursing note reviewed.  HENT:     Head: Normocephalic and atraumatic.     Mouth/Throat:     Pharynx: Oropharynx is clear.  Eyes:     Extraocular Movements: Extraocular movements intact.     Pupils: Pupils are equal, round, and reactive to light.  Cardiovascular:     Rate and Rhythm: Normal rate and regular rhythm.  Pulmonary:     Comments: Decreased breath sounds bilaterally.  Abdominal:     Palpations: Abdomen is soft.  Musculoskeletal:        General: Normal range of motion.     Cervical back: Normal range of motion.  Skin:    General: Skin is warm.  Neurological:     General: No focal deficit present.     Mental Status: He is  alert and oriented to person, place, and time.  Psychiatric:        Behavior: Behavior normal.        Judgment: Judgment normal.       LABORATORY DATA:  I have reviewed the data as listed    Component Value Date/Time   NA 137 09/24/2021 0905   NA 145 (H) 06/26/2021 1416   K 4.1 09/24/2021 0905   CL 104 09/24/2021 0905   CO2 27 09/24/2021 0905   GLUCOSE 84 09/24/2021 0905   BUN 21 09/24/2021 0905   BUN 16 06/26/2021 1416   CREATININE 1.07 09/24/2021 0905   CALCIUM 8.8 (L) 09/24/2021 0905   PROT 6.3 (L) 09/24/2021 0905   PROT 6.7 06/26/2021 1416   ALBUMIN 3.9 09/24/2021 0905   ALBUMIN 4.6 06/26/2021 1416   AST 22 09/24/2021 0905   ALT 17 09/24/2021 0905   ALKPHOS 46 09/24/2021 0905   BILITOT 0.8 09/24/2021 0905   BILITOT 0.4 06/26/2021 1416   GFRNONAA >60 09/24/2021 0905   GFRAA 75 05/10/2020 1017    No results found for: SPEP, UPEP  Lab Results  Component Value Date   WBC 4.8 09/24/2021   NEUTROABS 2.6 09/24/2021   HGB 14.7 09/24/2021   HCT 43.8  09/24/2021   MCV 92.0 09/24/2021   PLT 219 09/24/2021      Chemistry      Component Value Date/Time   NA 137 09/24/2021 0905   NA 145 (H) 06/26/2021 1416   K 4.1 09/24/2021 0905   CL 104 09/24/2021 0905   CO2 27 09/24/2021 0905   BUN 21 09/24/2021 0905   BUN 16 06/26/2021 1416   CREATININE 1.07 09/24/2021 0905   GLU 96 10/19/2014 0000      Component Value Date/Time   CALCIUM 8.8 (L) 09/24/2021 0905   ALKPHOS 46 09/24/2021 0905   AST 22 09/24/2021 0905   ALT 17 09/24/2021 0905   BILITOT 0.8 09/24/2021 0905   BILITOT 0.4 06/26/2021 1416       ASSESSMENT & PLAN:   1. 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 Encounter for antineoplastic immunotherapy    #Recurrent/metastatic melanoma to left axilla Currently on neoadjuvant immunotherapy. Labs are reviewed and discussed with patient. Proceed with Keytruda.  Discussed with Dr.Byrnnet via secure chat.  Patient will be seen by him for ultrasound evaluation office prior to cycle 4 Keytruda treatments.  Follow-up in 3 weeks, lab Dr. Ebbie Latus All questions were answered. The patient knows to call the clinic with any problems, questions or concerns.      Earlie Server, MD 09/24/2021 9:17 PM

## 2021-09-24 NOTE — Progress Notes (Signed)
Pt here for follow up. Pt reports more frequent muscle and joint achyness.

## 2021-09-24 NOTE — Patient Instructions (Signed)
[facility] CANCER CTR AT [place]   Discharge Instructions: Thank you for choosing [facility] to provide your oncology and hematology care.  If you have a lab appointment with the [facility], please go directly to the [place] and check in at the registration area.   We strive to give you quality time with your provider. You may need to reschedule your appointment if you arrive late (15 or more minutes).  Arriving late affects you and other patients whose appointments are after yours.  Also, if you miss three or more appointments without notifying the office, you may be dismissed from the clinic at the providers discretion.      For prescription refill requests, have your pharmacy contact our office and allow 72 hours for refills to be completed.    Today you received the following chemotherapy and/or immunotherapy agents: [place].      To help prevent nausea and vomiting after your treatment, we encourage you to take your nausea medication as directed.  BELOW ARE SYMPTOMS THAT SHOULD BE REPORTED IMMEDIATELY: *FEVER GREATER THAN 100.4 F (38 C) OR HIGHER *CHILLS OR SWEATING *NAUSEA AND VOMITING THAT IS NOT CONTROLLED WITH YOUR NAUSEA MEDICATION *UNUSUAL SHORTNESS OF BREATH *UNUSUAL BRUISING OR BLEEDING *URINARY PROBLEMS (pain or burning when urinating, or frequent urination) *BOWEL PROBLEMS (unusual diarrhea, constipation, pain near the anus) TENDERNESS IN MOUTH AND THROAT WITH OR WITHOUT PRESENCE OF ULCERS (sore throat, sores in mouth, or a toothache) UNUSUAL RASH, SWELLING OR PAIN  UNUSUAL VAGINAL DISCHARGE OR ITCHING   Items with * indicate a potential emergency and should be followed up as soon as possible or go to the Emergency Department if any problems should occur.  Please show the CHEMOTHERAPY ALERT CARD or IMMUNOTHERAPY ALERT CARD at check-in to the Emergency Department and triage nurse.  Should you have questions after your visit or need to  cancel or reschedule your appointment, please contact [facility] AT [place]  Dept: [phone]  and follow the prompts.  Office hours are 8:00 a.m. to 4:30 p.m. Monday - Friday. Please note that voicemails left after 4:00 p.m. may not be returned until the following business day.  We are closed weekends and major holidays. You have access to a nurse at all times for urgent questions. Please call the main number to the clinic Dept: [phone] and follow the prompts.  For any non-urgent questions, you may also contact your provider using MyChart. We now offer e-Visits for anyone 67 and older to request care online for non-urgent symptoms. For details visit [email or web address].   Also download the MyChart app! Go to the app store, search "MyChart", open the app, select [facility], and log in with your MyChart username and password.  Due to Covid, a mask is required upon entering the hospital/clinic. If you do not have a mask, one will be given to you upon arrival. For doctor visits, patients may have 1 support person aged 36 or older with them. For treatment visits, patients cannot have anyone with them due to current Covid guidelines and our immunocompromised population.

## 2021-10-10 ENCOUNTER — Other Ambulatory Visit: Payer: Self-pay | Admitting: *Deleted

## 2021-10-10 DIAGNOSIS — C779 Secondary and unspecified malignant neoplasm of lymph node, unspecified: Secondary | ICD-10-CM

## 2021-10-10 DIAGNOSIS — C439 Malignant melanoma of skin, unspecified: Secondary | ICD-10-CM

## 2021-10-11 DIAGNOSIS — C439 Malignant melanoma of skin, unspecified: Secondary | ICD-10-CM | POA: Diagnosis not present

## 2021-10-11 DIAGNOSIS — C779 Secondary and unspecified malignant neoplasm of lymph node, unspecified: Secondary | ICD-10-CM | POA: Diagnosis not present

## 2021-10-15 ENCOUNTER — Encounter: Payer: Self-pay | Admitting: Internal Medicine

## 2021-10-15 ENCOUNTER — Inpatient Hospital Stay: Payer: Medicare HMO

## 2021-10-15 ENCOUNTER — Inpatient Hospital Stay: Payer: Medicare HMO | Admitting: Internal Medicine

## 2021-10-15 ENCOUNTER — Other Ambulatory Visit: Payer: Self-pay

## 2021-10-15 ENCOUNTER — Inpatient Hospital Stay: Payer: Medicare HMO | Attending: Oncology

## 2021-10-15 ENCOUNTER — Other Ambulatory Visit: Payer: Self-pay | Admitting: General Surgery

## 2021-10-15 DIAGNOSIS — Z5112 Encounter for antineoplastic immunotherapy: Secondary | ICD-10-CM | POA: Insufficient documentation

## 2021-10-15 DIAGNOSIS — C4359 Malignant melanoma of other part of trunk: Secondary | ICD-10-CM | POA: Insufficient documentation

## 2021-10-15 DIAGNOSIS — C439 Malignant melanoma of skin, unspecified: Secondary | ICD-10-CM | POA: Diagnosis not present

## 2021-10-15 DIAGNOSIS — C773 Secondary and unspecified malignant neoplasm of axilla and upper limb lymph nodes: Secondary | ICD-10-CM | POA: Insufficient documentation

## 2021-10-15 DIAGNOSIS — C779 Secondary and unspecified malignant neoplasm of lymph node, unspecified: Secondary | ICD-10-CM

## 2021-10-15 LAB — CBC WITH DIFFERENTIAL/PLATELET
Abs Immature Granulocytes: 0.02 10*3/uL (ref 0.00–0.07)
Basophils Absolute: 0 10*3/uL (ref 0.0–0.1)
Basophils Relative: 1 %
Eosinophils Absolute: 0.2 10*3/uL (ref 0.0–0.5)
Eosinophils Relative: 3 %
HCT: 44.4 % (ref 39.0–52.0)
Hemoglobin: 14.9 g/dL (ref 13.0–17.0)
Immature Granulocytes: 0 %
Lymphocytes Relative: 31 %
Lymphs Abs: 1.6 10*3/uL (ref 0.7–4.0)
MCH: 30.8 pg (ref 26.0–34.0)
MCHC: 33.6 g/dL (ref 30.0–36.0)
MCV: 91.9 fL (ref 80.0–100.0)
Monocytes Absolute: 0.5 10*3/uL (ref 0.1–1.0)
Monocytes Relative: 9 %
Neutro Abs: 2.8 10*3/uL (ref 1.7–7.7)
Neutrophils Relative %: 56 %
Platelets: 216 10*3/uL (ref 150–400)
RBC: 4.83 MIL/uL (ref 4.22–5.81)
RDW: 12.8 % (ref 11.5–15.5)
WBC: 5.1 10*3/uL (ref 4.0–10.5)
nRBC: 0 % (ref 0.0–0.2)

## 2021-10-15 LAB — COMPREHENSIVE METABOLIC PANEL
ALT: 15 U/L (ref 0–44)
AST: 19 U/L (ref 15–41)
Albumin: 3.8 g/dL (ref 3.5–5.0)
Alkaline Phosphatase: 48 U/L (ref 38–126)
Anion gap: 7 (ref 5–15)
BUN: 16 mg/dL (ref 8–23)
CO2: 27 mmol/L (ref 22–32)
Calcium: 9.1 mg/dL (ref 8.9–10.3)
Chloride: 104 mmol/L (ref 98–111)
Creatinine, Ser: 1.05 mg/dL (ref 0.61–1.24)
GFR, Estimated: >60 mL/min (ref >60)
Glucose, Bld: 83 mg/dL (ref 70–99)
Potassium: 4.2 mmol/L (ref 3.5–5.1)
Sodium: 138 mmol/L (ref 135–145)
Total Bilirubin: 0.4 mg/dL (ref 0.3–1.2)
Total Protein: 6.4 g/dL — ABNORMAL LOW (ref 6.5–8.1)

## 2021-10-15 MED ORDER — SODIUM CHLORIDE 0.9 % IV SOLN
Freq: Once | INTRAVENOUS | Status: AC
  Filled 2021-10-15: qty 250

## 2021-10-15 MED ORDER — SODIUM CHLORIDE 0.9 % IV SOLN
200.0000 mg | Freq: Once | INTRAVENOUS | Status: AC
  Administered 2021-10-15: 200 mg via INTRAVENOUS
  Filled 2021-10-15 (×2): qty 8

## 2021-10-15 NOTE — Patient Instructions (Signed)
[facility] CANCER CTR AT [place]  Discharge Instructions: °Thank you for choosing [facility] to provide your oncology and hematology care.  °If you have a lab appointment with the Cancer Center, please go directly to the Cancer Center and check in at the registration area. ° °Wear comfortable clothing and clothing appropriate for easy access to any Portacath or PICC line.  ° °We strive to give you quality time with your provider. You may need to reschedule your appointment if you arrive late (15 or more minutes).  Arriving late affects you and other patients whose appointments are after yours.  Also, if you miss three or more appointments without notifying the office, you may be dismissed from the clinic at the provider’s discretion.    °  °For prescription refill requests, have your pharmacy contact our office and allow 72 hours for refills to be completed.   ° °Today you received the following chemotherapy and/or immunotherapy agents Keytruda °    °  °To help prevent nausea and vomiting after your treatment, we encourage you to take your nausea medication as directed. ° °BELOW ARE SYMPTOMS THAT SHOULD BE REPORTED IMMEDIATELY: °*FEVER GREATER THAN 100.4 F (38 °C) OR HIGHER °*CHILLS OR SWEATING °*NAUSEA AND VOMITING THAT IS NOT CONTROLLED WITH YOUR NAUSEA MEDICATION °*UNUSUAL SHORTNESS OF BREATH °*UNUSUAL BRUISING OR BLEEDING °*URINARY PROBLEMS (pain or burning when urinating, or frequent urination) °*BOWEL PROBLEMS (unusual diarrhea, constipation, pain near the anus) °TENDERNESS IN MOUTH AND THROAT WITH OR WITHOUT PRESENCE OF ULCERS (sore throat, sores in mouth, or a toothache) °UNUSUAL RASH, SWELLING OR PAIN  °UNUSUAL VAGINAL DISCHARGE OR ITCHING  ° °Items with * indicate a potential emergency and should be followed up as soon as possible or go to the Emergency Department if any problems should occur. ° °Please show the CHEMOTHERAPY ALERT CARD or IMMUNOTHERAPY ALERT CARD at check-in to  the Emergency Department and triage nurse. ° °Should you have questions after your visit or need to cancel or reschedule your appointment, please contact [facility] AT [facility]  [phone] and follow the prompts.  Office hours are 8:00 a.m. to 4:30 p.m. Monday - Friday. Please note that voicemails left after 4:00 p.m. may not be returned until the following business day.  We are closed weekends and major holidays. You have access to a nurse at all times for urgent questions. Please call the main number to the clinic [phone] and follow the prompts. ° °For any non-urgent questions, you may also contact your provider using MyChart. We now offer e-Visits for anyone 18 and older to request care online for non-urgent symptoms. For details visit [email or web address]. °  °Also download the MyChart app! Go to the app store, search "MyChart", open the app, select [place], and log in with your MyChart username and password. ° °Due to Covid, a mask is required upon entering the hospital/clinic. If you do not have a mask, one will be given to you upon arrival. For doctor visits, patients may have 1 support person aged 18 or older with them. For treatment visits, patients cannot have anyone with them due to current Covid guidelines and our immunocompromised population.  °

## 2021-10-15 NOTE — Assessment & Plan Note (Addendum)
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  OCT 20th, 2022- Left axillary adenopathy with dominant lymph node measuring 2.6 x 3.4 cm, potentially partially necrotic; along with additional prominent left axillary nodes; retroperitoneal  retroperitoneal lymph nodes are not enlarged by size.  OCT 2022-PET scan-uptake in the left axillary lymph nodes. Currently on neoadjuvant single agent Keytruda #3-no significant reduction in the size of LN based on [place] [per Dr,Byrnett].   # proceed with with [place] #4 today of planned total 18. Labs today reviewed;  acceptable for treatment today. Plan proceed with surgery after this cycle.  We will plan the rest of the 14 treatments after surgery/review pathology.  Discussed with Dr.Byrnett.  CT scan of the chest and pelvis ordered.  #  DISPOSITION: # [name] today # CT CAP ASAP # follow up in 3 weeks MD; labs- cbc/cmp; Keytruda-- Dr.B

## 2021-10-15 NOTE — Progress Notes (Signed)
Subjective:     Patient ID: [name]. is a 66 y.o. male.   HPI   The following portions of the patient's history were reviewed and updated as appropriate.   This an established patient is here today for: office visit.  The patient is being treated with [facility] for his left axillary malignant melanoma.  He has had 3 cycles of treatment.  The patient is here today for an left axillary ultrasound to assess response.   Patient reports tolerating the treatments very well. The patient is accompanied by his wife, [name].         Chief Complaint  Patient presents with   Follow-up      BP 124/76    Pulse 70    Temp 36.9 C (98.4 F)    Ht 185.4 cm (6\' 1" )    Wt 88 kg (194 lb)    SpO2 95%    BMI 25.60 kg/m        Past Medical History:  Diagnosis Date   Basal cell carcinoma     Dysplastic nevus 2015   GERD (gastroesophageal reflux disease)     Hypothyroidism (acquired), unspecified     Malignant melanoma (CMS-HCC) 2015    Dr [name] (chest)   Melanoma of trunk (CMS-HCC) 11/11/2013    Left lateral intraclavicular site: Shave biopsy 0.65 mm, Clark level III, 1 mitoses per millimeter squared without lymphovascular invasion.  Positive deep and peripheral margins.  Reexcised, 0.4 x 2.2 x 5.2 cm specimen with residual malignant melanoma to a depth of 0.3 mm with margins clear.;  Tyler Ser, MD           Past Surgical History:  Procedure Laterality Date   melanoma excision Right 11/24/2013   COLONOSCOPY   01/20/2017    The entire examined colon is normal/Repeat 42yrs/MUS   NEEDLE BIOPSY LYMPH NODE AXILLARY Left 07/10/2021   UPPER GASTROINTESTINAL ENDOSCOPY       wisdome teeth       wrist tendon surgery Left            Social History           Socioeconomic History   Marital status: Married  Tobacco Use   Smoking status: Never   Smokeless tobacco: Never  Substance and Sexual Activity   Alcohol use: Yes      Comment: 2 glasses wine on weekends.    Drug use:  No   Sexual activity: Defer        No Known Allergies   Current Medications        Current Outpatient Medications  Medication Sig Dispense Refill   aspirin 81 MG chewable tablet Take 81 mg by mouth once daily       levothyroxine (SYNTHROID, LEVOTHROID) 50 MCG tablet Take 1 tablet by mouth once daily.   1   multivitamin tablet Take 1 tablet by mouth once daily       pantoprazole (PROTONIX) 20 MG DR tablet TAKE 1 TABLET (20 MG TOTAL) BY MOUTH ONCE DAILY 30 MINUTES BEFORE BREAKFAST 90 tablet 3   vit C/E/Zn/coppr/lutein/zeaxan (PRESERVISION AREDS-2 ORAL) Take by mouth        No current facility-administered medications for this visit.             Family History  Problem Relation Age of Onset   Hyperlipidemia (Elevated cholesterol) Mother     Hypothyroidism Mother     High blood pressure (Hypertension) Mother     Arthritis Mother  Macular degeneration Mother     Myocardial Infarction (Heart attack) Father     Lung cancer Father     Heart disease Father     Breast cancer Neg Hx     Colon cancer Neg Hx          Labs and Radiology:    October 11, 2021 left axillary ultrasound:   Examination of the left axilla was undertaken.  The previously identified dominant lymph node in the midportion of the axilla presently measures 1.7 x 3.9 x 3.2 cm.  Prior to treatment, this measured 2.5 x 3.1 x 3.2 cm.  On a rough volume estimation this may be a 40% decrease.  Overall size is minimally changed.  Persistent vascularity within the node continues suggestive of an active metabolic process.  The smaller node superior to this measures 0.7 x 1.1 x 1.95 cm.  No cortical thickening at 0.15.  Previously this measured 0.8 x 1.3 x 1.5 cm.  Minimal overall change.     Review of Systems  Constitutional: Negative for chills and fever.  Respiratory: Negative for cough.          Objective:   Physical Exam Constitutional:      Appearance: Normal appearance.  Cardiovascular:     Rate and  Rhythm: Normal rate and regular rhythm.     Pulses: Normal pulses.     Heart sounds: Normal heart sounds.  Pulmonary:     Effort: Pulmonary effort is normal.     Breath sounds: Normal breath sounds.  Musculoskeletal:     Cervical back: Neck supple.  Lymphadenopathy:     Cervical: No cervical adenopathy.     Upper Body:     Right upper body: No supraclavicular or axillary adenopathy.     Left upper body: Axillary adenopathy ( 3 cm non-tender left axillary lymph node) present. No supraclavicular adenopathy.  Neurological:     Mental Status: He is alert and oriented to person, place, and time.  Psychiatric:        Mood and Affect: Mood normal.        Behavior: Behavior normal.           Assessment:     Clinically unchanged left axillary lymph node, possible modest reduction based on course volume analysis.  Persistent vascular flow.    Plan:     The nodal status was reviewed with the patient and message to Dr. Rogue Haynes.  The patient will present for his scheduled appointment at medical oncology on Monday,October 15, 2021.   Of note, prior August 06, 2021 PET scan had showed no evidence of additional foci of disease.    This note is partially prepared by [name], CMA acting as a scribe in the presence of Dr. Hervey Ard, MD.    The documentation recorded by the scribe accurately reflects the service I personally performed and the decisions made by me.    Tyler Bellow, MD FACS   Case reviewed with Dr. Rogue Haynes from medical oncology.  Will repeat CT after present cycle and proceed to axillary dissection. CT scheduled for 10/31/21.

## 2021-10-15 NOTE — Progress Notes (Signed)
Ok to tx per MD with BP 147/99

## 2021-10-15 NOTE — Progress Notes (Signed)
[facility] OFFICE PROGRESS NOTE  Patient Care Team: Jerrol Banana., MD as PCP - General (Family Medicine)   Cancer Staging  Malignant melanoma metastatic to lymph node [facility]) Staging form: Melanoma of the Skin, AJCC 8th Edition - Clinical: Stage III (cTX, [name], [name], MD on 08/13/2021   Oncology History Overview Note  #  2015- left collar bone [Dr.Kowalski]- s/p resection- 0.65 mm  #  A. 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   There is sufficient material for ancillary molecular testing if needed  (block A3, A1, A2).   Comment:  Per provided outside pathology report the patient had an "at least pT1b"  malignant melanoma of the left lateral infraclavicular skin in 2015.  Touch preparations demonstrate predominantly discohesive large  pleomorphic cells, some with binucleation.  HE sections demonstrate  lymph node parenchyma involved by metastatic neoplasm.  The neoplastic  cells are pleomorphic with binucleation, focal intranuclear inclusions,  and associated tumor necrosis. The neoplastic cells demonstrate the  following pattern of immunoreactivity:  S100: Positive  SOX10: Positive  HMB-45: Negative  Melan-A: Negative  Super pancytokeratin: Negative  CD45: Negative  This pattern of immunoreactivity supports the above diagnosis.   # NOV 7th, 2022-neoadjuvant Keytruda cycle #1       Malignant melanoma metastatic to lymph node ([place])  07/17/2021 Initial Diagnosis   Malignant melanoma metastatic to lymph node ([place])   08/13/2021 Cancer Staging   Staging form: Melanoma of the Skin, AJCC 8th Edition - Clinical: Stage III (cTX, [name], [name], MD on 08/13/2021    08/13/2021 -  Chemotherapy   Patient is on Treatment Plan : MELANOMA Pembrolizumab q21d       HPI: Ambulating independently.  Accompanied by his wife.  [name] 66 y.o.  male pleasant  patient above history of recurrent melanoma left axillary lymphadenopathy-currently on neoadjuvant [name] is here for follow-up.  In the interim patient was evaluated by surgery Dr. Bary Castilla with an ultrasound. On 1/06- [place] [in office]-no significant reduction in the size of the tumor or vascularity noted as per Dr. Bary Castilla.  Complains of mild fleeting achiness over the body.  Currently resolved.  Denies any nausea vomiting abdominal pain.  Denies any diarrhea.  Denies any skin rash.  Review of Systems  Constitutional:  Negative for chills, diaphoresis, fever, malaise/fatigue and weight loss.  HENT:  Negative for nosebleeds and sore throat.   Eyes:  Negative for double vision.  Respiratory:  Negative for hemoptysis, sputum production, shortness of breath and wheezing.   Cardiovascular:  Negative for chest pain, palpitations, orthopnea and leg swelling.  Gastrointestinal:  Negative for abdominal pain, blood in stool, constipation, diarrhea, heartburn, melena, nausea and vomiting.  Genitourinary:  Negative for dysuria, frequency and urgency.  Musculoskeletal:  Negative for back pain and joint pain.  Skin: Negative.  Negative for itching and rash.  Neurological:  Negative for dizziness, tingling, focal weakness and weakness.  Endo/Heme/Allergies:  Does not bruise/bleed easily.  Psychiatric/Behavioral:  Negative for depression. The patient is not nervous/anxious and does not have insomnia.      PAST MEDICAL HISTORY :  Past Medical History:  Diagnosis Date   Basal cell carcinoma 03/10/2013   Right medial forehead. Excised, margins free.   Dysplastic nevus 11/05/2006   Mid back paraspinal, [name], close to edge. Excised 12/24/2006, margins free.   Dysplastic nevus  12/15/2008   Left lat. pectoral area. Moderate atypia, extends to one edge.    Dysplastic nevus 12/07/2009   Left mid side. Moderate atypia, close to margin.   Dysplastic nevus 12/07/2009    Right posterior waistline. Moderate atypia, close to margin.   Dysplastic nevus 02/10/2014   Left paraspinal mid back. Moderate atypia, lateral and deep margin involved.    Dysplastic nevus 06/16/2014   Left epigastric. Mild atypia, deep margin involved.    GERD (gastroesophageal reflux disease)    Hx of basal cell carcinoma    multiple sites   Hx of malignant melanoma 11/11/2013   L lateral infraclavicular, superficial spreading, Breslow's 0.19mm, Clark's level III   Hypothyroidism    Melanoma ([place]) 11/11/2013   Left lateral infraclavicular. MM, superficial spreading. Anatomic level III. Tumor thickness 0.74mm. Excised 11/24/2013, margins free.     PAST SURGICAL HISTORY :   Past Surgical History:  Procedure Laterality Date   COLONOSCOPY WITH PROPOFOL N/A 01/20/2017   Procedure: COLONOSCOPY WITH PROPOFOL;  Surgeon: Lollie Sails, MD;  Location: [facility] ENDOSCOPY;  Service: Endoscopy;  [name];   ESOPHAGOGASTRODUODENOSCOPY (EGD) WITH ESOPHAGEAL DILATION     MELANOMA EXCISION Right 11/24/2013   lateral infraclavicular   WISDOM TOOTH EXTRACTION      FAMILY HISTORY :   Family History  Problem Relation Age of Onset   Macular degeneration Mother    Hyperlipidemia Mother    Heart disease Father    Heart attack Father    Lung cancer Father        metastasized    SOCIAL HISTORY:   Social History   Tobacco Use   Smoking status: Never   Smokeless tobacco: Never  Substance Use Topics   Alcohol use: Yes    Comment: about 1 glass of wine with dinner   Drug use: No    ALLERGIES:  has No Known Allergies.  MEDICATIONS:  Current Outpatient Medications  Medication Sig Dispense Refill   aspirin 81 MG tablet Take by mouth.     levothyroxine (SYNTHROID) 50 MCG tablet TAKE 1 TABLET BY MOUTH EVERY DAY 90 tablet 1   Multiple Vitamins tablet Take by mouth.     Multiple Vitamins-Minerals (PRESERVISION AREDS 2 PO) Take by mouth.     pantoprazole (PROTONIX) 20 MG tablet Take 1  tablet by mouth daily.  0   No current facility-administered medications for this visit.   Facility-Administered Medications Ordered in Other Visits  Medication Dose Route Frequency Provider Last Rate Last Admin   pembrolizumab (KEYTRUDA) 200 mg in sodium chloride 0.9 % 50 mL chemo infusion  200 mg Intravenous Once Cammie Sickle, MD 116 mL/hr at 10/15/21 1001 200 mg at 10/15/21 1001    PHYSICAL EXAMINATION: ECOG PERFORMANCE STATUS: 0 - Asymptomatic  BP (!) 147/99 (BP Location: Right Arm, Patient Position: Sitting, Cuff Size: Normal)    Pulse 69    Temp (!) 96.5 F (35.8 C) (Tympanic)    Ht 6\' 1"  (1.854 m)    Wt 195 lb 3.2 oz (88.5 kg)    SpO2 99%    BMI 25.75 kg/m   Filed Weights   10/15/21 0834  Weight: 195 lb 3.2 oz (88.5 kg)   Positive for lymph node left underarm approximate 2 to 3 cm in size.  Physical Exam Vitals and nursing note reviewed.  HENT:     Head: Normocephalic and atraumatic.     Mouth/Throat:     Pharynx: Oropharynx is clear.  Eyes:  Extraocular Movements: Extraocular movements intact.     Pupils: Pupils are equal, round, and reactive to light.  Cardiovascular:     Rate and Rhythm: Normal rate and regular rhythm.  Pulmonary:     Comments: Decreased breath sounds bilaterally.  Abdominal:     Palpations: Abdomen is soft.  Musculoskeletal:        General: Normal range of motion.     Cervical back: Normal range of motion.  Skin:    General: Skin is warm.  Neurological:     General: No focal deficit present.     Mental Status: He is alert and oriented to person, place, and time.  Psychiatric:        Behavior: Behavior normal.        Judgment: Judgment normal.       LABORATORY DATA:  I have reviewed the data as listed    Component Value Date/Time   NA 138 10/15/2021 0811   NA 145 (H) 06/26/2021 1416   K 4.2 10/15/2021 0811   CL 104 10/15/2021 0811   CO2 27 10/15/2021 0811   GLUCOSE 83 10/15/2021 0811   BUN 16 10/15/2021 0811   BUN  16 06/26/2021 1416   CREATININE 1.05 10/15/2021 0811   CALCIUM 9.1 10/15/2021 0811   PROT 6.4 (L) 10/15/2021 0811   PROT 6.7 06/26/2021 1416   ALBUMIN 3.8 10/15/2021 0811   ALBUMIN 4.6 06/26/2021 1416   AST 19 10/15/2021 0811   ALT 15 10/15/2021 0811   ALKPHOS 48 10/15/2021 0811   BILITOT 0.4 10/15/2021 0811   BILITOT 0.4 06/26/2021 1416   GFRNONAA >60 10/15/2021 0811   GFRAA 75 05/10/2020 1017    No results found for: SPEP, UPEP  Lab Results  Component Value Date   WBC 5.1 10/15/2021   NEUTROABS 2.8 10/15/2021   HGB 14.9 10/15/2021   HCT 44.4 10/15/2021   MCV 91.9 10/15/2021   PLT 216 10/15/2021      Chemistry      Component Value Date/Time   NA 138 10/15/2021 0811   NA 145 (H) 06/26/2021 1416   K 4.2 10/15/2021 0811   CL 104 10/15/2021 0811   CO2 27 10/15/2021 0811   BUN 16 10/15/2021 0811   BUN 16 06/26/2021 1416   CREATININE 1.05 10/15/2021 0811   GLU 96 10/19/2014 0000      Component Value Date/Time   CALCIUM 9.1 10/15/2021 0811   ALKPHOS 48 10/15/2021 0811   AST 19 10/15/2021 0811   ALT 15 10/15/2021 0811   BILITOT 0.4 10/15/2021 0811   BILITOT 0.4 06/26/2021 1416       RADIOGRAPHIC STUDIES: I have personally reviewed the radiological images as listed and agreed with the findings in the report. No results found.   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  OCT 20th, 2022- Left axillary adenopathy with dominant lymph node measuring 2.6 x 3.4 cm, potentially partially necrotic; along with additional prominent left axillary nodes; retroperitoneal  retroperitoneal lymph nodes are not enlarged by size.  OCT 2022-PET scan-uptake in the left axillary lymph nodes. Currently on neoadjuvant single agent Keytruda #3-no significant reduction in the size of LN based on [place] [per Dr,Byrnett].   # proceed with with [place] #4 today of planned total 18. Labs today reviewed;   acceptable for treatment today. Plan proceed with surgery after this cycle.  We will plan the rest of the 14 treatments after surgery/review pathology.  Discussed with Dr.Byrnett.  CT scan of the chest and pelvis ordered.  #  DISPOSITION: # [place] Math today # CT CAP ASAP # follow up in 3 weeks MD; labs- cbc/cmp; Keytruda-- Dr.B     Orders Placed This Encounter  Procedures   CT CHEST ABDOMEN PELVIS W CONTRAST    Standing Status:   Future    Standing Expiration Date:   10/15/2022    Order Specific Question:   Preferred imaging location?    Answer:   [facility]    Order Specific Question:   Radiology Contrast Protocol - do NOT remove file path    Answer:   [email or web address]    All questions were answered. The patient knows to call the clinic with any problems, questions or concerns.      Cammie Sickle, MD 10/15/2021 10:25 AM

## 2021-10-15 NOTE — Progress Notes (Signed)
Pt states he is having generalized body aches and tenderness, mainly in the rt leg/knee, arms elbow, groin/throat tenderness and calf cramps.  Also, having chest heaviness and headaches that come and go.   Had an u/s with Dr. Bary Castilla since last visit.

## 2021-10-16 DIAGNOSIS — M9903 Segmental and somatic dysfunction of lumbar region: Secondary | ICD-10-CM | POA: Diagnosis not present

## 2021-10-16 DIAGNOSIS — M9901 Segmental and somatic dysfunction of cervical region: Secondary | ICD-10-CM | POA: Diagnosis not present

## 2021-10-16 DIAGNOSIS — M5033 Other cervical disc degeneration, cervicothoracic region: Secondary | ICD-10-CM | POA: Diagnosis not present

## 2021-10-16 DIAGNOSIS — M62830 Muscle spasm of back: Secondary | ICD-10-CM | POA: Diagnosis not present

## 2021-10-17 DIAGNOSIS — H5213 Myopia, bilateral: Secondary | ICD-10-CM | POA: Diagnosis not present

## 2021-10-17 DIAGNOSIS — H40013 Open angle with borderline findings, low risk, bilateral: Secondary | ICD-10-CM | POA: Diagnosis not present

## 2021-10-17 DIAGNOSIS — H43813 Vitreous degeneration, bilateral: Secondary | ICD-10-CM | POA: Diagnosis not present

## 2021-10-17 DIAGNOSIS — H2513 Age-related nuclear cataract, bilateral: Secondary | ICD-10-CM | POA: Diagnosis not present

## 2021-10-23 ENCOUNTER — Other Ambulatory Visit: Payer: Self-pay

## 2021-10-23 ENCOUNTER — Encounter
Admission: RE | Admit: 2021-10-23 | Discharge: 2021-10-23 | Disposition: A | Discharge status: Home / Self Care | Payer: Medicare HMO | Source: Ambulatory Visit | Attending: General Surgery | Admitting: General Surgery

## 2021-10-23 NOTE — Patient Instructions (Addendum)
Your procedure is scheduled on: Friday 1/27 Report to Day Surgery. Stop by Registration desk first To find out your arrival time please call [phone] between 1PM - 3PM on Thurs 1/16.  Remember: Instructions that are not followed completely may result in serious medical risk,  up to and including death, or upon the discretion of your surgeon and anesthesiologist your  surgery may need to be rescheduled.     _X__ 1. Do not eat food after midnight the night before your procedure.                 No chewing gum or hard candies. You may drink clear liquids up to 2 hours                 before you are scheduled to arrive for your surgery- DO not drink clear                 liquids within 2 hours of the start of your surgery.                 Clear Liquids include:  water, apple juice without pulp, clear Gatorade, G2 or                  Gatorade Zero (avoid Red/Purple/Blue), Black Coffee or Tea (Do not add                 anything to coffee or tea). __X__2.  On the morning of surgery brush your teeth with toothpaste and water, you                may rinse your mouth with mouthwash if you wish.  Do not swallow any toothpaste of mouthwash.     _X__ 3.  No Alcohol for 24 hours before or after surgery.   ___ 4.  Do Not Smoke or use e-cigarettes For 24 Hours Prior to Your Surgery.                 Do not use any chewable tobacco products for at least 6 hours prior to                 Surgery.  ___  5.  Do not use any recreational drugs (marijuana, cocaine, heroin, ecstasy,  MDMA or other) For at least one week prior to your surgery.   Combination of these drugs with anesthesia may have life threatening   results.  ____  6.  Bring all medications with you on the day of surgery if instructed.   __x__  7.  Notify your doctor if there is any change in your medical condition      (cold, fever, infections).     Do not wear jewelry,  Do not wear lotions, powders, or perfumes.   Do not shave 48 hours prior to surgery. Men may shave face and neck. Do not bring valuables to the hospital.    [facility] is not responsible for any belongings or valuables.  Contacts, dentures or bridgework may not be worn into surgery. Leave your suitcase in the car. After surgery it may be brought to your room. For patients admitted to the hospital, discharge time is determined by your treatment team.   Patients discharged the day of surgery will not be allowed to drive home.   Make arrangements for someone to be with you for the first 24 hours of your Same Day Discharge.    Please read over the following  fact sheets that you were given:       __x__ Take these medicines the morning of surgery with A SIP OF WATER:    1. levothyroxine (SYNTHROID) 50 MCG tablet  2. pantoprazole (PROTONIX) 20 MG tablet      take dose the night before and the morning of surgery  3.   4.  5.  6.  ____ Fleet Enema (as directed)   __x__ Use CHG Soap (or wipes) as directed  ____ Use Benzoyl Peroxide Gel as instructed  ____ Use inhalers on the day of surgery  ____ Stop metformin 2 days prior to surgery    ____ Take 1/2 of usual insulin dose the night before surgery. No insulin the morning          of surgery.   __x__ May continue aspirin.  Do not take the morning of surgery.   ___x_ May continue Anti-inflammatories    __x__ Stop supplements until after surgery.    ____ Bring C-Pap to the hospital.    If you have any questions regarding your pre-procedure instructions,  Please call Pre-admit Testing at [phone]

## 2021-10-24 ENCOUNTER — Encounter
Admission: RE | Admit: 2021-10-24 | Discharge: 2021-10-24 | Disposition: A | Discharge status: Home / Self Care | Payer: Medicare HMO | Source: Ambulatory Visit | Attending: General Surgery | Admitting: General Surgery

## 2021-10-24 DIAGNOSIS — Z01810 Encounter for preprocedural cardiovascular examination: Secondary | ICD-10-CM | POA: Diagnosis not present

## 2021-10-31 ENCOUNTER — Ambulatory Visit
Admission: RE | Admit: 2021-10-31 | Discharge: 2021-10-31 | Disposition: A | Discharge status: Home / Self Care | Payer: Medicare HMO | Source: Ambulatory Visit | Attending: Internal Medicine | Admitting: Internal Medicine

## 2021-10-31 ENCOUNTER — Other Ambulatory Visit: Payer: Self-pay

## 2021-10-31 ENCOUNTER — Other Ambulatory Visit: Payer: Medicare HMO

## 2021-10-31 DIAGNOSIS — J219 Acute bronchiolitis, unspecified: Secondary | ICD-10-CM | POA: Diagnosis not present

## 2021-10-31 DIAGNOSIS — C779 Secondary and unspecified malignant neoplasm of lymph node, unspecified: Secondary | ICD-10-CM | POA: Diagnosis not present

## 2021-10-31 DIAGNOSIS — Q8909 Congenital malformations of spleen: Secondary | ICD-10-CM | POA: Diagnosis not present

## 2021-10-31 DIAGNOSIS — C439 Malignant melanoma of skin, unspecified: Secondary | ICD-10-CM | POA: Diagnosis not present

## 2021-10-31 DIAGNOSIS — I2510 Atherosclerotic heart disease of native coronary artery without angina pectoris: Secondary | ICD-10-CM | POA: Diagnosis not present

## 2021-10-31 DIAGNOSIS — I700 Atherosclerosis of aorta: Secondary | ICD-10-CM | POA: Diagnosis not present

## 2021-10-31 DIAGNOSIS — D1803 Hemangioma of intra-abdominal structures: Secondary | ICD-10-CM | POA: Diagnosis not present

## 2021-10-31 DIAGNOSIS — C773 Secondary and unspecified malignant neoplasm of axilla and upper limb lymph nodes: Secondary | ICD-10-CM | POA: Diagnosis not present

## 2021-10-31 MED ORDER — IOHEXOL 300 MG/ML  SOLN
100.0000 mL | Freq: Once | INTRAMUSCULAR | Status: AC | PRN
  Administered 2021-10-31: 13:00:00 100 mL via INTRAVENOUS

## 2021-11-01 ENCOUNTER — Encounter: Payer: Self-pay | Admitting: Internal Medicine

## 2021-11-01 DIAGNOSIS — C779 Secondary and unspecified malignant neoplasm of lymph node, unspecified: Secondary | ICD-10-CM | POA: Diagnosis not present

## 2021-11-01 DIAGNOSIS — C439 Malignant melanoma of skin, unspecified: Secondary | ICD-10-CM | POA: Diagnosis not present

## 2021-11-02 ENCOUNTER — Encounter: Payer: Self-pay | Admitting: General Surgery

## 2021-11-02 ENCOUNTER — Encounter
Admission: RE | Disposition: A | Discharge status: Home / Self Care | Payer: Self-pay | Source: Home / Self Care | Attending: General Surgery

## 2021-11-02 ENCOUNTER — Other Ambulatory Visit: Payer: Self-pay

## 2021-11-02 ENCOUNTER — Ambulatory Visit
Admission: RE | Admit: 2021-11-02 | Discharge: 2021-11-02 | Disposition: A | Discharge status: Home / Self Care | Payer: Medicare HMO | Attending: General Surgery | Admitting: General Surgery

## 2021-11-02 ENCOUNTER — Ambulatory Visit: Payer: Medicare HMO | Admitting: Registered Nurse

## 2021-11-02 DIAGNOSIS — E039 Hypothyroidism, unspecified: Secondary | ICD-10-CM | POA: Diagnosis not present

## 2021-11-02 DIAGNOSIS — K219 Gastro-esophageal reflux disease without esophagitis: Secondary | ICD-10-CM | POA: Insufficient documentation

## 2021-11-02 DIAGNOSIS — C4362 Malignant melanoma of left upper limb, including shoulder: Secondary | ICD-10-CM | POA: Diagnosis not present

## 2021-11-02 DIAGNOSIS — Z85820 Personal history of malignant melanoma of skin: Secondary | ICD-10-CM | POA: Insufficient documentation

## 2021-11-02 DIAGNOSIS — C439 Malignant melanoma of skin, unspecified: Secondary | ICD-10-CM | POA: Diagnosis not present

## 2021-11-02 DIAGNOSIS — C773 Secondary and unspecified malignant neoplasm of axilla and upper limb lymph nodes: Secondary | ICD-10-CM | POA: Insufficient documentation

## 2021-11-02 HISTORY — PX: AXILLARY LYMPH NODE DISSECTION: SHX5229

## 2021-11-02 SURGERY — LYMPHADENECTOMY, AXILLARY
Anesthesia: General | Site: Axilla | Laterality: Left

## 2021-11-02 MED ORDER — CHLORHEXIDINE GLUCONATE CLOTH 2 % EX PADS
6.0000 | MEDICATED_PAD | Freq: Once | CUTANEOUS | Status: AC
  Administered 2021-11-02: 6 via TOPICAL

## 2021-11-02 MED ORDER — ONDANSETRON HCL 4 MG/2ML IJ SOLN: 4.0000 mg | Freq: Once | INTRAMUSCULAR | Status: DC | PRN

## 2021-11-02 MED ORDER — DEXAMETHASONE SODIUM PHOSPHATE 10 MG/ML IJ SOLN
INTRAMUSCULAR | Status: AC
  Filled 2021-11-02: qty 1

## 2021-11-02 MED ORDER — STERILE WATER FOR IRRIGATION IR SOLN
Status: DC | PRN
  Administered 2021-11-02: 500 mL

## 2021-11-02 MED ORDER — SUCCINYLCHOLINE CHLORIDE 200 MG/10ML IV SOSY
PREFILLED_SYRINGE | INTRAVENOUS | Status: DC | PRN
  Administered 2021-11-02: 120 mg via INTRAVENOUS

## 2021-11-02 MED ORDER — METHYLENE BLUE 0.5 % INJ SOLN
INTRAVENOUS | Status: AC
  Filled 2021-11-02: qty 10

## 2021-11-02 MED ORDER — FENTANYL CITRATE (PF) 100 MCG/2ML IJ SOLN
INTRAMUSCULAR | Status: DC | PRN
  Administered 2021-11-02: 50 ug via INTRAVENOUS
  Administered 2021-11-02 (×2): 25 ug via INTRAVENOUS

## 2021-11-02 MED ORDER — BUPIVACAINE-EPINEPHRINE 0.5% -1:200000 IJ SOLN
INTRAMUSCULAR | Status: DC | PRN
  Administered 2021-11-02: 20 mL

## 2021-11-02 MED ORDER — CHLORHEXIDINE GLUCONATE 0.12 % MT SOLN
OROMUCOSAL | Status: AC
  Administered 2021-11-02: 15 mL via OROMUCOSAL
  Filled 2021-11-02: qty 15

## 2021-11-02 MED ORDER — DEXAMETHASONE SODIUM PHOSPHATE 10 MG/ML IJ SOLN
INTRAMUSCULAR | Status: DC | PRN
  Administered 2021-11-02: 8 mg via INTRAVENOUS

## 2021-11-02 MED ORDER — CHLORHEXIDINE GLUCONATE 0.12 % MT SOLN: 15.0000 mL | Freq: Once | OROMUCOSAL | Status: AC

## 2021-11-02 MED ORDER — PROPOFOL 10 MG/ML IV BOLUS
INTRAVENOUS | Status: AC
  Filled 2021-11-02: qty 20

## 2021-11-02 MED ORDER — PROPOFOL 10 MG/ML IV BOLUS
INTRAVENOUS | Status: DC | PRN
  Administered 2021-11-02: 160 mg via INTRAVENOUS

## 2021-11-02 MED ORDER — ORAL CARE MOUTH RINSE: 15.0000 mL | Freq: Once | OROMUCOSAL | Status: AC

## 2021-11-02 MED ORDER — LACTATED RINGERS IV SOLN: INTRAVENOUS | Status: DC

## 2021-11-02 MED ORDER — FENTANYL CITRATE (PF) 100 MCG/2ML IJ SOLN
INTRAMUSCULAR | Status: AC
  Filled 2021-11-02: qty 2

## 2021-11-02 MED ORDER — FAMOTIDINE 20 MG PO TABS
ORAL_TABLET | ORAL | Status: AC
  Filled 2021-11-02: qty 1

## 2021-11-02 MED ORDER — LIDOCAINE HCL (PF) 2 % IJ SOLN
INTRAMUSCULAR | Status: AC
  Filled 2021-11-02: qty 5

## 2021-11-02 MED ORDER — ACETAMINOPHEN 10 MG/ML IV SOLN
INTRAVENOUS | Status: AC
  Filled 2021-11-02: qty 100

## 2021-11-02 MED ORDER — LIDOCAINE HCL (CARDIAC) PF 100 MG/5ML IV SOSY
PREFILLED_SYRINGE | INTRAVENOUS | Status: DC | PRN
  Administered 2021-11-02: 80 mg via INTRAVENOUS

## 2021-11-02 MED ORDER — HYDROCODONE-ACETAMINOPHEN 5-325 MG PO TABS: 1.0000 | ORAL_TABLET | ORAL | 0 refills | Status: DC | PRN

## 2021-11-02 MED ORDER — MIDAZOLAM HCL 2 MG/2ML IJ SOLN
INTRAMUSCULAR | Status: AC
  Filled 2021-11-02: qty 2

## 2021-11-02 MED ORDER — ONDANSETRON HCL 4 MG/2ML IJ SOLN
INTRAMUSCULAR | Status: AC
  Filled 2021-11-02: qty 2

## 2021-11-02 MED ORDER — FENTANYL CITRATE (PF) 100 MCG/2ML IJ SOLN: 25.0000 ug | INTRAMUSCULAR | Status: DC | PRN

## 2021-11-02 MED ORDER — CEFAZOLIN SODIUM-DEXTROSE 2-4 GM/100ML-% IV SOLN
INTRAVENOUS | Status: AC
  Filled 2021-11-02: qty 100

## 2021-11-02 MED ORDER — BUPIVACAINE-EPINEPHRINE (PF) 0.5% -1:200000 IJ SOLN
INTRAMUSCULAR | Status: AC
  Filled 2021-11-02: qty 30

## 2021-11-02 MED ORDER — OXYCODONE HCL 5 MG/5ML PO SOLN: 5.0000 mg | Freq: Once | ORAL | Status: DC | PRN

## 2021-11-02 MED ORDER — ACETAMINOPHEN 10 MG/ML IV SOLN
INTRAVENOUS | Status: DC | PRN
  Administered 2021-11-02: 1000 mg via INTRAVENOUS

## 2021-11-02 MED ORDER — EPHEDRINE SULFATE (PRESSORS) 50 MG/ML IJ SOLN
INTRAMUSCULAR | Status: DC | PRN
  Administered 2021-11-02 (×3): 5 mg via INTRAVENOUS

## 2021-11-02 MED ORDER — ONDANSETRON HCL 4 MG/2ML IJ SOLN
INTRAMUSCULAR | Status: DC | PRN
  Administered 2021-11-02: 4 mg via INTRAVENOUS

## 2021-11-02 MED ORDER — CEFAZOLIN SODIUM-DEXTROSE 2-4 GM/100ML-% IV SOLN
2.0000 g | INTRAVENOUS | Status: AC
  Administered 2021-11-02: 2 g via INTRAVENOUS

## 2021-11-02 MED ORDER — ACETAMINOPHEN 10 MG/ML IV SOLN: 1000.0000 mg | Freq: Once | INTRAVENOUS | Status: DC | PRN

## 2021-11-02 MED ORDER — OXYCODONE HCL 5 MG PO TABS: 5.0000 mg | ORAL_TABLET | Freq: Once | ORAL | Status: DC | PRN

## 2021-11-02 MED ORDER — KETOROLAC TROMETHAMINE 30 MG/ML IJ SOLN
INTRAMUSCULAR | Status: DC | PRN
  Administered 2021-11-02: 30 mg via INTRAVENOUS

## 2021-11-02 MED ORDER — MIDAZOLAM HCL 2 MG/2ML IJ SOLN
INTRAMUSCULAR | Status: DC | PRN
  Administered 2021-11-02: 1 mg via INTRAVENOUS

## 2021-11-02 MED ORDER — KETOROLAC TROMETHAMINE 30 MG/ML IJ SOLN
INTRAMUSCULAR | Status: AC
  Filled 2021-11-02: qty 1

## 2021-11-02 SURGICAL SUPPLY — 51 items
APL PRP STRL LF DISP 70% ISPRP (MISCELLANEOUS) ×1
APPLIER CLIP 11 MED OPEN (CLIP)
APR CLP MED 11 20 MLT OPN (CLIP)
BLADE BOVIE TIP EXT 4 (BLADE) ×1 IMPLANT
BLADE SURG 15 STRL SS SAFETY (BLADE) ×2 IMPLANT
BNDG CMPR STD VLCR NS LF 5.8X6 (GAUZE/BANDAGES/DRESSINGS) ×1
BNDG ELASTIC 6X5.8 VLCR NS LF (GAUZE/BANDAGES/DRESSINGS) ×2 IMPLANT
BNDG GAUZE ELAST 4 BULKY (GAUZE/BANDAGES/DRESSINGS) ×2 IMPLANT
BULB RESERV EVAC DRAIN JP 100C (MISCELLANEOUS) ×1 IMPLANT
CHLORAPREP W/TINT 26 (MISCELLANEOUS) ×2 IMPLANT
CLIP APPLIE 11 MED OPEN (CLIP) ×1 IMPLANT
DRAIN JP 15F RND TROCAR (DRAIN) ×1 IMPLANT
DRAPE LAPAROTOMY TRNSV 106X77 (MISCELLANEOUS) ×2 IMPLANT
DRSG GAUZE FLUFF 36X18 (GAUZE/BANDAGES/DRESSINGS) ×2 IMPLANT
DRSG OPSITE POSTOP 3X4 (GAUZE/BANDAGES/DRESSINGS) ×1 IMPLANT
DRSG OPSITE POSTOP 4X6 (GAUZE/BANDAGES/DRESSINGS) ×1 IMPLANT
DRSG TEGADERM 4X4.75 (GAUZE/BANDAGES/DRESSINGS) ×2 IMPLANT
DRSG TELFA 4X3 1S NADH ST (GAUZE/BANDAGES/DRESSINGS) ×2 IMPLANT
ELECT REM PT RETURN 9FT ADLT (ELECTROSURGICAL) ×2
ELECTRODE REM PT RTRN 9FT ADLT (ELECTROSURGICAL) ×1 IMPLANT
GAUZE 4X4 16PLY ~~LOC~~+RFID DBL (SPONGE) ×2 IMPLANT
GLOVE SURG ENC MOIS LTX SZ7.5 (GLOVE) ×2 IMPLANT
GLOVE SURG UNDER LTX SZ8 (GLOVE) ×2 IMPLANT
GOWN STRL REUS W/ TWL LRG LVL3 (GOWN DISPOSABLE) ×2 IMPLANT
GOWN STRL REUS W/TWL LRG LVL3 (GOWN DISPOSABLE) ×4
KIT TURNOVER KIT A (KITS) ×2 IMPLANT
LABEL OR SOLS (LABEL) ×2 IMPLANT
MANIFOLD NEPTUNE II (INSTRUMENTS) ×2 IMPLANT
MARGIN MAP 10MM (MISCELLANEOUS) ×1 IMPLANT
NDL HYPO 25X1 1.5 SAFETY (NEEDLE) ×1 IMPLANT
NEEDLE HYPO 25X1 1.5 SAFETY (NEEDLE) ×2 IMPLANT
NS IRRIG 500ML POUR BTL (IV SOLUTION) ×2 IMPLANT
PACK BASIN MINOR ARMC (MISCELLANEOUS) ×2 IMPLANT
PENCIL ELECTRO HAND CTR (MISCELLANEOUS) ×1 IMPLANT
PIN SAFETY STRL (MISCELLANEOUS) ×1 IMPLANT
RETRACTOR RING XSMALL (MISCELLANEOUS) IMPLANT
RTRCTR WOUND ALEXIS 13CM XS SH (MISCELLANEOUS) ×2
SHEARS FOC LG CVD HARMONIC 17C (MISCELLANEOUS) ×2 IMPLANT
STRIP CLOSURE SKIN 1/2X4 (GAUZE/BANDAGES/DRESSINGS) ×2 IMPLANT
SUT ETH BLK MONO 3 0 FS 1 12/B (SUTURE) ×1 IMPLANT
SUT SILK 2 0 (SUTURE) ×2
SUT SILK 2-0 30XBRD TIE 12 (SUTURE) ×1 IMPLANT
SUT VIC AB 0 CT1 36 (SUTURE) ×2 IMPLANT
SUT VIC AB 2-0 CT1 27 (SUTURE) ×4
SUT VIC AB 2-0 CT1 TAPERPNT 27 (SUTURE) ×1 IMPLANT
SUT VIC AB 3-0 54X BRD REEL (SUTURE) ×1 IMPLANT
SUT VIC AB 3-0 BRD 54 (SUTURE)
SUT VIC AB 4-0 PS2 18 (SUTURE) ×2 IMPLANT
SWABSTK COMLB BENZOIN TINCTURE (MISCELLANEOUS) ×2 IMPLANT
SYR 10ML LL (SYRINGE) ×2 IMPLANT
WATER STERILE IRR 500ML POUR (IV SOLUTION) ×2 IMPLANT

## 2021-11-02 NOTE — Telephone Encounter (Signed)
Called and spoke to pt and gave him his appt date and time.

## 2021-11-02 NOTE — Telephone Encounter (Signed)
Done

## 2021-11-02 NOTE — Op Note (Signed)
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  Postoperative diagnosis: Same.  Operative procedure: Left axillary dissection.  Operating Surgeon: Hervey Ard, MD.  Anesthesia: General endotracheal, Marcaine 0.5% with 1: 200,000 units of epinephrine, 30 cc.  Estimated blood loss: 5 cc.  Clinical note: This 66 year old male presented with axillary adenopathy and biopsy showed evidence of a malignant melanoma.  He had previously had a melanoma removed from the left clavicular area many years earlier.  He is undergone 4 cycles of Keytruda therapy with a approximately 40-50% reduction in node volume.  He is felt to be a candidate for dissection.  SCD stockings for DVT prevention.  Hair was removed and the surgical site with clippers.  Operative note: Patient underwent general anesthesia and tolerated this well.  The left chest and axilla was then cleansed with ChloraPrep and draped.  Marcaine was infiltrated for postoperative analgesia.  A transverse incision near the base of the hairline was made.  The skin was incised sharply and the remaining dissection completed with electrocautery.  The adipose tissue was divided with cautery.  Once the axillary envelope was opened an extra small Alexis wound protector was placed.  The axillary contents were swept medially from the pectoralis muscle.  At the apex of the axilla a firm node was identified and this was sent separately as as that represented the highest level of the axillary dissection.  Beginning at the axillary vein and extending and down the axillary contents were swept inferior laterally.  The long thoracic nerve of Bell and thoracodorsal nerve artery and vein bundle were identified and protected.  Both nerves were functional at the end of the procedure.  Good hemostasis was noted.  The nodes were sent for routine histology.  A 15 [place] Blake drain was brought out through a separate stab wound incision inferior to the wound.   This was anchored in the position with a 3-0 nylon suture and placed to self suction at the end of the procedure.    The axillary envelope was closed with a running 2-0 Vicryl suture.  The adipose layer was closed in a similar fashion.  The skin was closed with a running 4-0 Vicryl subcuticular suture.  Both the drain site and the surgical site were covered with honeycomb dressings.  The patient tolerated the procedure well and was taken to the PACU in stable condition.

## 2021-11-02 NOTE — Anesthesia Preprocedure Evaluation (Addendum)
Anesthesia Evaluation  Patient identified by MRN, date of birth, ID band Patient awake    Reviewed: Allergy & Precautions, NPO status , Patient's Chart, lab work & pertinent test results  History of Anesthesia Complications Negative for: history of anesthetic complications  Airway Mallampati: I   Neck ROM: Full    Dental no notable dental hx.    Pulmonary neg pulmonary ROS,    Pulmonary exam normal breath sounds clear to auscultation       Cardiovascular Exercise Tolerance: Good negative cardio ROS Normal cardiovascular exam Rhythm:Regular Rate:Normal  ECG 10/24/21: normal   Neuro/Psych negative neurological ROS     GI/Hepatic GERD  ,  Endo/Other  Hypothyroidism   Renal/GU negative Renal ROS     Musculoskeletal   Abdominal   Peds  Hematology Metastatic melanoma   Anesthesia Other Findings   Reproductive/Obstetrics                            Anesthesia Physical Anesthesia Plan  ASA: 2  Anesthesia Plan: General   Post-op Pain Management:    Induction: Intravenous  PONV Risk Score and Plan: 2 and Ondansetron, Dexamethasone and Treatment may vary due to age or medical condition  Airway Management Planned: Oral ETT  Additional Equipment:   Intra-op Plan:   Post-operative Plan: Extubation in OR  Informed Consent: I have reviewed the patients History and Physical, chart, labs and discussed the procedure including the risks, benefits and alternatives for the proposed anesthesia with the patient or authorized representative who has indicated his/her understanding and acceptance.     Dental advisory given  Plan Discussed with: CRNA  Anesthesia Plan Comments: (Patient consented for risks of anesthesia including but not limited to:  - adverse reactions to medications - damage to eyes, teeth, lips or other oral mucosa - nerve damage due to positioning  - sore throat or  hoarseness - damage to heart, brain, nerves, lungs, other parts of body or loss of life  Informed patient about role of CRNA in peri- and intra-operative care.  Patient voiced understanding.)        Anesthesia Quick Evaluation

## 2021-11-02 NOTE — Discharge Instructions (Signed)

## 2021-11-02 NOTE — Progress Notes (Signed)
C-please cancel appointment next week; please re-schedule to 2 weeks or so.  MD; labs CBC CMP; [name]- Thanks-GB ------------------ Hi-I reviewed the CT scan with Dr. Bary Castilla last night. Good luck with  your surgery as planned today.    We will reschedule appointments in about 2 weeks after surgery-plan to start treatment again. Thanks- GB

## 2021-11-02 NOTE — OR Nursing (Signed)
Dr. Bary Castilla in to see pt in postop 1020 am.

## 2021-11-02 NOTE — Anesthesia Postprocedure Evaluation (Signed)
Anesthesia Post Note  Patient: [name]  Procedure(s) Performed: AXILLARY LYMPH NODE DISSECTION (Left: Axilla)  Patient location during evaluation: PACU Anesthesia Type: General Level of consciousness: awake and alert, oriented and patient cooperative Pain management: pain level controlled Vital Signs Assessment: post-procedure vital signs reviewed and stable Respiratory status: spontaneous breathing, nonlabored ventilation and respiratory function stable Cardiovascular status: blood pressure returned to baseline and stable Postop Assessment: adequate PO intake Anesthetic complications: no   No notable events documented.   Last Vitals:  Vitals:   11/02/21 1000 11/02/21 1009  BP: 139/86 (!) 159/90  Pulse: 68 70  Resp: 10 15  Temp: 36.7 C 36.4 C  SpO2: 100% 100%    Last Pain:  Vitals:   11/02/21 1009  TempSrc: Tympanic  PainSc: 0-No pain                 Darrin Nipper

## 2021-11-02 NOTE — Transfer of Care (Signed)
Immediate Anesthesia Transfer of Care Note  Patient: [name] [facility]  Procedure(s) Performed: AXILLARY LYMPH NODE DISSECTION (Left: Axilla)  Patient Location: PACU  Anesthesia Type:General  Level of Consciousness: sedated  Airway & Oxygen Therapy: Patient Spontanous Breathing and Patient connected to face mask oxygen  Post-op Assessment: Report given to RN and Post -op Vital signs reviewed and stable  Post vital signs: Reviewed and stable  Last Vitals:  Vitals Value Taken Time  BP 127/74 11/02/21 0932  Temp 36.2 C 11/02/21 0932  Pulse 71 11/02/21 0932  Resp 21 11/02/21 0932  SpO2 100 % 11/02/21 0932  Vitals shown include unvalidated device data.  Last Pain:  Vitals:   11/02/21 0629  TempSrc: Temporal  PainSc: 0-No pain         Complications: No notable events documented.

## 2021-11-02 NOTE — H&P (Signed)
No change from yesterday's exam.   Subjective:   Patient ID: [name]. is a 66 y.o. male.  HPI  The following portions of the patient's history were reviewed and updated as appropriate.  This an established patient is here today for: office visit. Here for his preop exam, planned for left axillary dissection on 11-02-21.  The patient is accompanied by his wife, [name].  Review of Systems  Constitutional: Negative for chills and fever.  Respiratory: Negative for cough.   Chief Complaint  Patient presents with   Pre-op Exam    BP 116/58   Pulse 66   Temp 36.5 C (97.7 F)   Ht 185.4 cm (6\' 1" )   Wt 88 kg (194 lb)   SpO2 98%   BMI 25.60 kg/m   Past Medical History:  Diagnosis Date   Basal cell carcinoma   Dysplastic nevus 2015   GERD (gastroesophageal reflux disease)   Hypothyroidism (acquired), unspecified   Malignant melanoma (CMS-HCC) 2015  Dr Nehemiah Massed (chest)   Melanoma of trunk (CMS-HCC) 11/11/2013  Left lateral intraclavicular site: Shave biopsy 0.65 mm, Clark level III, 1 mitoses per millimeter squared without lymphovascular invasion. Positive deep and peripheral margins. Reexcised, 0.4 x 2.2 x 5.2 cm specimen with residual malignant melanoma to a depth of 0.3 mm with margins clear.; Sarina Ser, MD    Past Surgical History:  Procedure Laterality Date   melanoma excision Right 11/24/2013   COLONOSCOPY 01/20/2017  The entire examined colon is normal/Repeat 109yrs/MUS   NEEDLE BIOPSY LYMPH NODE AXILLARY Left 07/10/2021   UPPER GASTROINTESTINAL ENDOSCOPY   wisdome teeth   wrist tendon surgery Left    Social History   Socioeconomic History   Marital status: Married  Tobacco Use   Smoking status: Never   Smokeless tobacco: Never  Substance and Sexual Activity   Alcohol use: Yes  Comment: 2 glasses wine on weekends.   Drug use: No   Sexual activity: Defer    No Known Allergies  Current Outpatient Medications  Medication Sig Dispense Refill    aspirin 81 MG chewable tablet Take 81 mg by mouth once daily   levothyroxine (SYNTHROID, LEVOTHROID) 50 MCG tablet Take 1 tablet by mouth once daily. 1   pantoprazole (PROTONIX) 20 MG DR tablet TAKE 1 TABLET (20 MG TOTAL) BY MOUTH ONCE DAILY 30 MINUTES BEFORE BREAKFAST 90 tablet 3   multivitamin tablet Take 1 tablet by mouth once daily (Patient not taking: Reported on 11/01/2021)   vit C/E/Zn/coppr/lutein/zeaxan (PRESERVISION AREDS-2 ORAL) Take by mouth (Patient not taking: Reported on 11/01/2021)   No current facility-administered medications for this visit.   Family History  Problem Relation Age of Onset   Hyperlipidemia (Elevated cholesterol) Mother   Hypothyroidism Mother   High blood pressure (Hypertension) Mother   Arthritis Mother   Macular degeneration Mother   Myocardial Infarction (Heart attack) Father   Lung cancer Father   Heart disease Father   Breast cancer Neg Hx   Colon cancer Neg Hx   Labs and Radiology:   October 31, 2021 CT of the chest abdomen and pelvis:  This study was independently reviewed. Report pending.  Significant decrease in size of the dominant left axillary mass with an estimated 50% reduction in maximal volume. No new lesions on my review.   Objective:  Physical Exam Constitutional:  Appearance: Normal appearance.  Lymphadenopathy:  Upper Body:  Left upper body: Axillary adenopathy present.  Skin: General: Skin is warm and dry.  Neurological:  Mental  Status: He is alert and oriented to person, place, and time.  Psychiatric:  Mood and Affect: Mood normal.  Behavior: Behavior normal.   No left upper extremity is evident.  Assessment:   Significant response to immunotherapy with left axillary melanoma.  Significant vascularity on recent ultrasound of the area.  Plan:   Indications for lymphadenectomy reviewed.  Patient has been encouraged to avoid repetitive activities with the left arm until the drain is removed and he is not  developing any evidence of seroma.  He is very anxious to keep a planned ski trip to [place] this year. I anticipate that this will be possible sometime mid March or later.    This note is partially prepared by Karie Fetch, RN, acting as a scribe in the presence of Dr. Hervey Ard, MD.  The documentation recorded by the scribe accurately reflects the service I personally performed and the decisions made by me.   Welles Bellow, MD FACS  Electronically signed by Mayer Masker, MD at 11/01/2021 3:28 PM EST

## 2021-11-02 NOTE — Anesthesia Procedure Notes (Signed)
Procedure Name: Intubation Date/Time: 11/02/2021 7:39 AM Performed by: Hedda Slade, CRNA Pre-anesthesia Checklist: Patient identified, Patient being monitored, Timeout performed, Emergency Drugs available and Suction available Patient Re-evaluated:Patient Re-evaluated prior to induction Oxygen Delivery Method: Circle system utilized Preoxygenation: Pre-oxygenation with 100% oxygen Induction Type: IV induction Ventilation: Mask ventilation without difficulty Laryngoscope Size: McGraph and 4 Grade View: Grade I Tube type: Oral Tube size: 7.5 mm Number of attempts: 1 Airway Equipment and Method: Stylet Placement Confirmation: ETT inserted through vocal cords under direct vision, positive ETCO2 and breath sounds checked- equal and bilateral Secured at: 22 cm Tube secured with: Tape Dental Injury: Teeth and Oropharynx as per pre-operative assessment

## 2021-11-03 ENCOUNTER — Encounter: Payer: Self-pay | Admitting: General Surgery

## 2021-11-05 ENCOUNTER — Inpatient Hospital Stay: Payer: Medicare HMO

## 2021-11-05 ENCOUNTER — Inpatient Hospital Stay: Payer: Medicare HMO | Admitting: Internal Medicine

## 2021-11-06 LAB — SURGICAL PATHOLOGY

## 2021-11-12 NOTE — Progress Notes (Signed)
I spoke to patient regarding the significant partial response noted on the lymph node resection specimen. Keep appointment as planned.

## 2021-11-16 ENCOUNTER — Inpatient Hospital Stay: Payer: Medicare HMO | Admitting: Internal Medicine

## 2021-11-16 ENCOUNTER — Other Ambulatory Visit: Payer: Self-pay | Admitting: Internal Medicine

## 2021-11-16 ENCOUNTER — Other Ambulatory Visit: Payer: Self-pay

## 2021-11-16 ENCOUNTER — Inpatient Hospital Stay: Payer: Medicare HMO | Attending: Oncology

## 2021-11-16 ENCOUNTER — Encounter: Payer: Self-pay | Admitting: Internal Medicine

## 2021-11-16 ENCOUNTER — Inpatient Hospital Stay: Payer: Medicare HMO

## 2021-11-16 DIAGNOSIS — C779 Secondary and unspecified malignant neoplasm of lymph node, unspecified: Secondary | ICD-10-CM | POA: Diagnosis not present

## 2021-11-16 DIAGNOSIS — C773 Secondary and unspecified malignant neoplasm of axilla and upper limb lymph nodes: Secondary | ICD-10-CM | POA: Insufficient documentation

## 2021-11-16 DIAGNOSIS — C439 Malignant melanoma of skin, unspecified: Secondary | ICD-10-CM

## 2021-11-16 DIAGNOSIS — Z5112 Encounter for antineoplastic immunotherapy: Secondary | ICD-10-CM | POA: Diagnosis not present

## 2021-11-16 DIAGNOSIS — C4362 Malignant melanoma of left upper limb, including shoulder: Secondary | ICD-10-CM | POA: Diagnosis not present

## 2021-11-16 DIAGNOSIS — Z79899 Other long term (current) drug therapy: Secondary | ICD-10-CM | POA: Insufficient documentation

## 2021-11-16 LAB — CBC WITH DIFFERENTIAL/PLATELET
Abs Immature Granulocytes: 0.03 10*3/uL (ref 0.00–0.07)
Basophils Absolute: 0.1 10*3/uL (ref 0.0–0.1)
Basophils Relative: 1 %
Eosinophils Absolute: 0.2 10*3/uL (ref 0.0–0.5)
Eosinophils Relative: 4 %
HCT: 42.6 % (ref 39.0–52.0)
Hemoglobin: 14.5 g/dL (ref 13.0–17.0)
Immature Granulocytes: 1 %
Lymphocytes Relative: 34 %
Lymphs Abs: 1.8 10*3/uL (ref 0.7–4.0)
MCH: 30.5 pg (ref 26.0–34.0)
MCHC: 34 g/dL (ref 30.0–36.0)
MCV: 89.5 fL (ref 80.0–100.0)
Monocytes Absolute: 0.4 10*3/uL (ref 0.1–1.0)
Monocytes Relative: 8 %
Neutro Abs: 2.8 10*3/uL (ref 1.7–7.7)
Neutrophils Relative %: 52 %
Platelets: 231 10*3/uL (ref 150–400)
RBC: 4.76 MIL/uL (ref 4.22–5.81)
RDW: 12.5 % (ref 11.5–15.5)
WBC: 5.2 10*3/uL (ref 4.0–10.5)
nRBC: 0 % (ref 0.0–0.2)

## 2021-11-16 LAB — COMPREHENSIVE METABOLIC PANEL
ALT: 19 U/L (ref 0–44)
AST: 20 U/L (ref 15–41)
Albumin: 3.7 g/dL (ref 3.5–5.0)
Alkaline Phosphatase: 56 U/L (ref 38–126)
Anion gap: 9 (ref 5–15)
BUN: 21 mg/dL (ref 8–23)
CO2: 23 mmol/L (ref 22–32)
Calcium: 9 mg/dL (ref 8.9–10.3)
Chloride: 105 mmol/L (ref 98–111)
Creatinine, Ser: 1.13 mg/dL (ref 0.61–1.24)
GFR, Estimated: >60 mL/min (ref >60)
Glucose, Bld: 119 mg/dL — ABNORMAL HIGH (ref 70–99)
Potassium: 4.2 mmol/L (ref 3.5–5.1)
Sodium: 137 mmol/L (ref 135–145)
Total Bilirubin: <0.1 mg/dL — ABNORMAL LOW (ref 0.3–1.2)
Total Protein: 6.6 g/dL (ref 6.5–8.1)

## 2021-11-16 LAB — TSH: TSH: 1.411 u[IU]/mL (ref 0.350–4.500)

## 2021-11-16 MED ORDER — SODIUM CHLORIDE 0.9 % IV SOLN
200.0000 mg | Freq: Once | INTRAVENOUS | Status: AC
  Administered 2021-11-16: 200 mg via INTRAVENOUS
  Filled 2021-11-16: qty 200

## 2021-11-16 MED ORDER — SODIUM CHLORIDE 0.9 % IV SOLN
Freq: Once | INTRAVENOUS | Status: AC
  Filled 2021-11-16: qty 250

## 2021-11-16 NOTE — Assessment & Plan Note (Addendum)
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  OCT 20th, 2022- Left axillary adenopathy with dominant lymph node measuring 2.6 x 3.4 cm, potentially partially necrotic; along with additional prominent left axillary nodes; retroperitoneal  retroperitoneal lymph nodes are not enlarged by size.  OCT 2022-PET scan-uptake in the left axillary lymph nodes. Currently on neoadjuvant single agent Keytruda #4 - JAN 25th, 2023-CT scan-slightly smaller compared to baseline.  #Status post axillary lymph node dissection-significant partial response noted-  FOUR OF THIRTEEN LYMPH NODES INVOLVED BY METASTATIC MELANOMA (4/13)-however,  majority of these foci measure 1-2 mm in greatest dimension, with the largest measuring 4 mm.   # I reviewed the near complete response findings noted on final pathology-which is obviously very encouraging compared to partial response noted on clinical exam/imaging.  Discussed the standard role of radiation for melanoma s/p surgery.  However given the significant pathologic response; and the possible risk of lymphedema-we will discuss with radiation oncology. We will also review at tumor conference.  Also discussed regarding second opinion; wants to hold off for now.  # proceed with with [place] #5 today of planned total 18. Labs today reviewed;  acceptable for treatment today. ADD TSH to labs today.   # DISPOSITION: needs Monday/early AM schedule # add TSH to labs today # Nat Math today # follow up to March 6th MD; labs- cbc/cmp; Keytruda-- Dr.B

## 2021-11-16 NOTE — Progress Notes (Signed)
Upper eft arm tingling from recent lymphnode dissection and was discussed with surgeon.    Would like to know when he could go for dental evaluation/cleaning.

## 2021-11-16 NOTE — Patient Instructions (Signed)
[facility]  Discharge Instructions: °Thank you for choosing [facility] to provide your oncology and hematology care.  °If you have a lab appointment with the Cancer Center, please go directly to the Cancer Center and check in at the registration area. ° °Wear comfortable clothing and clothing appropriate for easy access to any Portacath or PICC line.  ° °We strive to give you quality time with your provider. You may need to reschedule your appointment if you arrive late (15 or more minutes).  Arriving late affects you and other patients whose appointments are after yours.  Also, if you miss three or more appointments without notifying the office, you may be dismissed from the clinic at the provider’s discretion.    °  °For prescription refill requests, have your pharmacy contact our office and allow 72 hours for refills to be completed.   ° °Today you received the following chemotherapy and/or immunotherapy agents - pembrolizumab    °  °To help prevent nausea and vomiting after your treatment, we encourage you to take your nausea medication as directed. ° °BELOW ARE SYMPTOMS THAT SHOULD BE REPORTED IMMEDIATELY: °*FEVER GREATER THAN 100.4 F (38 °C) OR HIGHER °*CHILLS OR SWEATING °*NAUSEA AND VOMITING THAT IS NOT CONTROLLED WITH YOUR NAUSEA MEDICATION °*UNUSUAL SHORTNESS OF BREATH °*UNUSUAL BRUISING OR BLEEDING °*URINARY PROBLEMS (pain or burning when urinating, or frequent urination) °*BOWEL PROBLEMS (unusual diarrhea, constipation, pain near the anus) °TENDERNESS IN MOUTH AND THROAT WITH OR WITHOUT PRESENCE OF ULCERS (sore throat, sores in mouth, or a toothache) °UNUSUAL RASH, SWELLING OR PAIN  °UNUSUAL VAGINAL DISCHARGE OR ITCHING  ° °Items with * indicate a potential emergency and should be followed up as soon as possible or go to the Emergency Department if any problems should occur. ° °Please show the CHEMOTHERAPY ALERT CARD or IMMUNOTHERAPY ALERT CARD at  check-in to the Emergency Department and triage nurse. ° °Should you have questions after your visit or need to cancel or reschedule your appointment, please contact [facility]  [phone] and follow the prompts.  Office hours are 8:00 a.m. to 4:30 p.m. Monday - Friday. Please note that voicemails left after 4:00 p.m. may not be returned until the following business day.  We are closed weekends and major holidays. You have access to a nurse at all times for urgent questions. Please call the main number to the clinic [phone] and follow the prompts. ° °For any non-urgent questions, you may also contact your provider using MyChart. We now offer e-Visits for anyone 18 and older to request care online for non-urgent symptoms. For details visit [email or web address]. °  °Also download the MyChart app! Go to the app store, search "MyChart", open the app, select [facility], and log in with your MyChart username and password. ° °Due to Covid, a mask is required upon entering the hospital/clinic. If you do not have a mask, one will be given to you upon arrival. For doctor visits, patients may have 1 support person aged 18 or older with them. For treatment visits, patients cannot have anyone with them due to current Covid guidelines and our immunocompromised population.  ° °Pembrolizumab injection °What is this medication? °PEMBROLIZUMAB (pem broe liz ue mab) is a monoclonal antibody. It is used to treat certain types of cancer. °This medicine may be used for other purposes; ask your health care provider or pharmacist if you have questions. °COMMON BRAND NAME(S): Keytruda °What should I tell my care   team before I take this medication? °They need to know if you have any of these conditions: °autoimmune diseases like Crohn's disease, ulcerative colitis, or lupus °have had or planning to have an allogeneic stem cell transplant (uses someone else's stem cells) °history of organ  transplant °history of chest radiation °nervous system problems like myasthenia gravis or Guillain-Barre syndrome °an unusual or allergic reaction to pembrolizumab, other medicines, foods, dyes, or preservatives °pregnant or trying to get pregnant °breast-feeding °How should I use this medication? °This medicine is for infusion into a vein. It is given by a health care professional in a hospital or clinic setting. °A special MedGuide will be given to you before each treatment. Be sure to read this information carefully each time. °Talk to your pediatrician regarding the use of this medicine in children. While this drug may be prescribed for children as young as 6 months for selected conditions, precautions do apply. °Overdosage: If you think you have taken too much of this medicine contact a poison control center or emergency room at once. °NOTE: This medicine is only for you. Do not share this medicine with others. °What if I miss a dose? °It is important not to miss your dose. Call your doctor or health care professional if you are unable to keep an appointment. °What may interact with this medication? °Interactions have not been studied. °This list may not describe all possible interactions. Give your health care provider a list of all the medicines, herbs, non-prescription drugs, or dietary supplements you use. Also tell them if you smoke, drink alcohol, or use illegal drugs. Some items may interact with your medicine. °What should I watch for while using this medication? °Your condition will be monitored carefully while you are receiving this medicine. °You may need blood work done while you are taking this medicine. °Do not become pregnant while taking this medicine or for 4 months after stopping it. Women should inform their doctor if they wish to become pregnant or think they might be pregnant. There is a potential for serious side effects to an unborn child. Talk to your health care professional or  pharmacist for more information. Do not breast-feed an infant while taking this medicine or for 4 months after the last dose. °What side effects may I notice from receiving this medication? °Side effects that you should report to your doctor or health care professional as soon as possible: °allergic reactions like skin rash, itching or hives, swelling of the face, lips, or tongue °bloody or black, tarry °breathing problems °changes in vision °chest pain °chills °confusion °constipation °cough °diarrhea °dizziness or feeling faint or lightheaded °fast or irregular heartbeat °fever °flushing °joint pain °low blood counts - this medicine may decrease the number of white blood cells, red blood cells and platelets. You may be at increased risk for infections and bleeding. °muscle pain °muscle weakness °pain, tingling, numbness in the hands or feet °persistent headache °redness, blistering, peeling or loosening of the skin, including inside the mouth °signs and symptoms of high blood sugar such as dizziness; dry mouth; dry skin; fruity breath; nausea; stomach pain; increased hunger or thirst; increased urination °signs and symptoms of kidney injury like trouble passing urine or change in the amount of urine °signs and symptoms of liver injury like dark urine, light-colored stools, loss of appetite, nausea, right upper belly pain, yellowing of the eyes or skin °sweating °swollen lymph nodes °weight loss °Side effects that usually do not require medical attention (report to your doctor   or health care professional if they continue or are bothersome): °decreased appetite °hair loss °tiredness °This list may not describe all possible side effects. Call your doctor for medical advice about side effects. You may report side effects to FDA at 1-800-FDA-1088. °Where should I keep my medication? °This drug is given in a hospital or clinic and will not be stored at home. °NOTE: This sheet is a summary. It may not cover all possible  information. If you have questions about this medicine, talk to your doctor, pharmacist, or health care provider. °© 2022 [employer]vier/Gold Standard (2021-06-12 00:00:00) ° °

## 2021-11-16 NOTE — Progress Notes (Signed)
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   There is sufficient material for ancillary molecular testing if needed  (block A3, A1, A2).   Comment:  Per provided outside pathology report the patient had an "at least pT1b"  malignant melanoma of the left lateral infraclavicular skin in 2015.  Touch preparations demonstrate predominantly discohesive large  pleomorphic cells, some with binucleation.  HE sections demonstrate  lymph node parenchyma involved by metastatic neoplasm.  The neoplastic  cells are pleomorphic with binucleation, focal intranuclear inclusions,  and associated tumor necrosis. The neoplastic cells demonstrate the  following pattern of immunoreactivity:  S100: Positive  SOX10: Positive  HMB-45: Negative  Melan-A: Negative  Super pancytokeratin: Negative  CD45: Negative  This pattern of immunoreactivity supports the above diagnosis.   # NOV 7th, 2022-neoadjuvant Keytruda cycle #1  # JAN, 26th, [name],  majority of these foci measure 1-2 mm in greatest dimension, with the largest measuring 4 mm.        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  Accompanied by his wife.  [name] 66 y.o.  male pleasant patient above history of recurrent melanoma left axillary lymphadenopathy-currently on neoadjuvant [name] is here for follow-up/review results of the CT scan done preop; also postoperative pathology results.  Surgery went well.  No postoperative complications.  Patient complains of mild discomfort in his left upper arm.  Otherwise no new lumps or bumps noted.  Patient had his drain removed earlier in the week.  Denies any nausea vomiting abdominal pain.  Denies any diarrhea.  Denies any skin rash.  Review of Systems  Constitutional:  Negative for chills, diaphoresis, fever, malaise/fatigue and weight loss.  HENT:  Negative for nosebleeds and sore throat.   Eyes:  Negative for double vision.  Respiratory:  Negative for hemoptysis, sputum production, shortness of breath and wheezing.   Cardiovascular:  Negative for chest pain, palpitations, orthopnea and leg swelling.  Gastrointestinal:  Negative for abdominal pain, blood in stool, constipation, diarrhea, heartburn, melena, nausea and vomiting.  Genitourinary:  Negative for dysuria, frequency and urgency.  Musculoskeletal:  Negative for back pain and joint pain.  Skin: Negative.  Negative for itching and rash.  Neurological:  Negative for dizziness, tingling, focal weakness and weakness.  Endo/Heme/Allergies:  Does not bruise/bleed easily.  Psychiatric/Behavioral:  Negative for depression. The patient is not nervous/anxious and does not have insomnia.      PAST MEDICAL HISTORY :  Past Medical History:  Diagnosis Date   Basal cell carcinoma 03/10/2013   Right medial forehead. Excised, margins free.   Dysplastic nevus 11/05/2006   Mid back paraspinal, 1.0cm lat to spine.  Moderately severe atypia, close to edge. Excised 12/24/2006, margins free.   Dysplastic nevus 12/15/2008   Left lat. pectoral area. Moderate atypia, extends to one edge.    Dysplastic nevus 12/07/2009   Left mid side. Moderate atypia, close to margin.   Dysplastic nevus 12/07/2009   Right posterior waistline. Moderate atypia, close to margin.   Dysplastic nevus 02/10/2014   Left paraspinal mid back. Moderate atypia, lateral and deep margin involved.    Dysplastic nevus 06/16/2014   Left epigastric. Mild atypia, deep margin involved.    GERD (gastroesophageal reflux disease)    Hx of basal cell carcinoma    multiple sites   Hx of malignant melanoma 11/11/2013   L lateral infraclavicular, superficial spreading, Breslow's 0.39mm, Clark's level III   Hypothyroidism    Melanoma ([facility]) 11/11/2013   Left lateral infraclavicular. MM, superficial spreading. Anatomic level III. Tumor thickness 0.28mm. Excised 11/24/2013, margins free.     PAST SURGICAL HISTORY :   Past Surgical History:  Procedure Laterality Date   AXILLARY LYMPH NODE DISSECTION Left 11/02/2021   Procedure: AXILLARY LYMPH NODE DISSECTION;  Surgeon: Evo Bellow, MD;  [name];  Service: General;  Laterality: Left;   COLONOSCOPY WITH PROPOFOL N/A 01/20/2017   Procedure: COLONOSCOPY WITH PROPOFOL;  Surgeon: Lollie Sails, MD;  Location: [facility];  Service: Endoscopy;  [name];   ESOPHAGOGASTRODUODENOSCOPY (EGD) WITH ESOPHAGEAL DILATION     MELANOMA EXCISION Right 11/24/2013   lateral infraclavicular   WISDOM TOOTH EXTRACTION     WRIST SURGERY Left 2022    FAMILY HISTORY :   Family History  Problem Relation Age of Onset   Macular degeneration Mother    Hyperlipidemia Mother    Heart disease Father    Heart attack Father    Lung cancer Father        metastasized    SOCIAL HISTORY:   Social History   Tobacco Use   Smoking status: Never   Smokeless tobacco: Never  Vaping Use   Vaping  Use: Never used  Substance Use Topics   Alcohol use: Yes    Comment: about 1-3 glass of wine with dinner   Drug use: No    ALLERGIES:  has No Known Allergies.  MEDICATIONS:  Current Outpatient Medications  Medication Sig Dispense Refill   aspirin 81 MG tablet Take 81 mg by mouth daily.     levothyroxine (SYNTHROID) 50 MCG tablet TAKE 1 TABLET BY MOUTH EVERY DAY 90 tablet 1   Multiple Vitamins tablet Take 1 tablet by mouth daily.     Multiple Vitamins-Minerals (PRESERVISION AREDS 2 PO) Take 1 capsule by mouth in the morning and at bedtime.     pantoprazole (PROTONIX) 20 MG tablet Take 20 mg by mouth daily.  0   Pembrolizumab (KEYTRUDA IV) Inject 1 Dose into the vein every 21 ( twenty-one) days.     HYDROcodone-acetaminophen (NORCO/VICODIN) 5-325 MG tablet Take 1 tablet by mouth every 4 (four) hours as needed for moderate pain. (Patient not taking: Reported on 11/16/2021) 15 tablet 0   No current facility-administered medications for this visit.    PHYSICAL EXAMINATION: ECOG PERFORMANCE STATUS: 0 - Asymptomatic  BP (!) 141/94    Pulse 64    Temp (!) 97 F (36.1 C)    Resp 18  Wt 195 lb 6.4 oz (88.6 kg)    BMI 25.78 kg/m   Filed Weights   11/16/21 1000  Weight: 195 lb 6.4 oz (88.6 kg)   Positive for lymph node left underarm approximate 2 to 3 cm in size.  Physical Exam Vitals and nursing note reviewed.  HENT:     Head: Normocephalic and atraumatic.     Mouth/Throat:     Pharynx: Oropharynx is clear.  Eyes:     Extraocular Movements: Extraocular movements intact.     Pupils: Pupils are equal, round, and reactive to light.  Cardiovascular:     Rate and Rhythm: Normal rate and regular rhythm.  Pulmonary:     Comments: Decreased breath sounds bilaterally.  Abdominal:     Palpations: Abdomen is soft.  Musculoskeletal:        General: Normal range of motion.     Cervical back: Normal range of motion.  Skin:    General: Skin is warm.  Neurological:     General: No  focal deficit present.     Mental Status: He is alert and oriented to person, place, and time.  Psychiatric:        Behavior: Behavior normal.        Judgment: Judgment normal.       LABORATORY DATA:  I have reviewed the data as listed    Component Value Date/Time   NA 137 11/16/2021 1029   NA 145 (H) 06/26/2021 1416   K 4.2 11/16/2021 1029   CL 105 11/16/2021 1029   CO2 23 11/16/2021 1029   GLUCOSE 119 (H) 11/16/2021 1029   BUN 21 11/16/2021 1029   BUN 16 06/26/2021 1416   CREATININE 1.13 11/16/2021 1029   CALCIUM 9.0 11/16/2021 1029   PROT 6.6 11/16/2021 1029   PROT 6.7 06/26/2021 1416   ALBUMIN 3.7 11/16/2021 1029   ALBUMIN 4.6 06/26/2021 1416   AST 20 11/16/2021 1029   ALT 19 11/16/2021 1029   ALKPHOS 56 11/16/2021 1029   BILITOT <0.1 (L) 11/16/2021 1029   BILITOT 0.4 06/26/2021 1416   GFRNONAA >60 11/16/2021 1029   GFRAA 75 05/10/2020 1017    No results found for: SPEP, UPEP  Lab Results  Component Value Date   WBC 5.2 11/16/2021   NEUTROABS 2.8 11/16/2021   HGB 14.5 11/16/2021   HCT 42.6 11/16/2021   MCV 89.5 11/16/2021   PLT 231 11/16/2021      Chemistry      Component Value Date/Time   NA 137 11/16/2021 1029   NA 145 (H) 06/26/2021 1416   K 4.2 11/16/2021 1029   CL 105 11/16/2021 1029   CO2 23 11/16/2021 1029   BUN 21 11/16/2021 1029   BUN 16 06/26/2021 1416   CREATININE 1.13 11/16/2021 1029   GLU 96 10/19/2014 0000      Component Value Date/Time   CALCIUM 9.0 11/16/2021 1029   ALKPHOS 56 11/16/2021 1029   AST 20 11/16/2021 1029   ALT 19 11/16/2021 1029   BILITOT <0.1 (L) 11/16/2021 1029   BILITOT 0.4 06/26/2021 1416       RADIOGRAPHIC STUDIES: I have personally reviewed the radiological images as listed and agreed with the findings in the report. No results found.   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  OCT 20th, 2022- Left axillary  adenopathy with dominant lymph node measuring 2.6 x 3.4 cm, potentially partially necrotic; along with additional prominent left  axillary nodes; retroperitoneal  retroperitoneal lymph nodes are not enlarged by size.  OCT 2022-PET scan-uptake in the left axillary lymph nodes. Currently on neoadjuvant single agent Keytruda #4 - JAN 25th, 2023-CT scan-slightly smaller compared to baseline.  #Status post axillary lymph node dissection-significant partial response noted-  FOUR OF THIRTEEN LYMPH NODES INVOLVED BY METASTATIC MELANOMA (4/13)-however,  majority of these foci measure 1-2 mm in greatest dimension, with the largest measuring 4 mm.   # I reviewed the near complete response findings noted on final pathology-which is obviously very encouraging compared to partial response noted on clinical exam/imaging.  Discussed the standard role of radiation for melanoma s/p surgery.  However given the significant pathologic response; and the possible risk of lymphedema-we will discuss with radiation oncology. We will also review at tumor conference.  Also discussed regarding second opinion; wants to hold off for now.  # proceed with with [place] #5 today of planned total 18. Labs today reviewed;  acceptable for treatment today. ADD TSH to labs today.   # DISPOSITION: needs Monday/early AM schedule # add TSH to labs today # Nat Math today # follow up to March 6th MD; labs- cbc/cmp; Keytruda-- Dr.B     Orders Placed This Encounter  Procedures   TSH    Standing Status:   Future    Number of Occurrences:   1    Standing Expiration Date:   11/16/2022    All questions were answered. The patient knows to call the clinic with any problems, questions or concerns.      Cammie Sickle, MD 11/16/2021 1:05 PM

## 2021-11-20 DIAGNOSIS — M62830 Muscle spasm of back: Secondary | ICD-10-CM | POA: Diagnosis not present

## 2021-11-20 DIAGNOSIS — M9901 Segmental and somatic dysfunction of cervical region: Secondary | ICD-10-CM | POA: Diagnosis not present

## 2021-11-20 DIAGNOSIS — M5033 Other cervical disc degeneration, cervicothoracic region: Secondary | ICD-10-CM | POA: Diagnosis not present

## 2021-11-20 DIAGNOSIS — M9903 Segmental and somatic dysfunction of lumbar region: Secondary | ICD-10-CM | POA: Diagnosis not present

## 2021-11-21 ENCOUNTER — Ambulatory Visit: Payer: Medicare HMO | Admitting: Dermatology

## 2021-11-21 ENCOUNTER — Other Ambulatory Visit: Payer: Self-pay

## 2021-11-21 DIAGNOSIS — L814 Other melanin hyperpigmentation: Secondary | ICD-10-CM | POA: Diagnosis not present

## 2021-11-21 DIAGNOSIS — L578 Other skin changes due to chronic exposure to nonionizing radiation: Secondary | ICD-10-CM

## 2021-11-21 DIAGNOSIS — Z85820 Personal history of malignant melanoma of skin: Secondary | ICD-10-CM

## 2021-11-21 DIAGNOSIS — Z1283 Encounter for screening for malignant neoplasm of skin: Secondary | ICD-10-CM

## 2021-11-21 DIAGNOSIS — C4359 Malignant melanoma of other part of trunk: Secondary | ICD-10-CM

## 2021-11-21 DIAGNOSIS — L821 Other seborrheic keratosis: Secondary | ICD-10-CM | POA: Diagnosis not present

## 2021-11-21 DIAGNOSIS — D1800 Hemangioma unspecified site: Secondary | ICD-10-CM | POA: Diagnosis not present

## 2021-11-21 DIAGNOSIS — Z86018 Personal history of other benign neoplasm: Secondary | ICD-10-CM

## 2021-11-21 DIAGNOSIS — Z85828 Personal history of other malignant neoplasm of skin: Secondary | ICD-10-CM | POA: Diagnosis not present

## 2021-11-21 DIAGNOSIS — L570 Actinic keratosis: Secondary | ICD-10-CM | POA: Diagnosis not present

## 2021-11-21 DIAGNOSIS — C439 Malignant melanoma of skin, unspecified: Secondary | ICD-10-CM

## 2021-11-21 DIAGNOSIS — D229 Melanocytic nevi, unspecified: Secondary | ICD-10-CM | POA: Diagnosis not present

## 2021-11-21 MED ORDER — FLUOROURACIL 5 % EX CREA: TOPICAL_CREAM | CUTANEOUS | 1 refills | Status: DC

## 2021-11-21 NOTE — Progress Notes (Signed)
Follow-Up Visit   Subjective  [name] is a 66 y.o. male who presents for the following: Annual Exam (Mole check ). The patient presents for Total-Body Skin Exam (TBSE) for skin cancer screening and mole check.  The patient has spots, moles and lesions to be evaluated, some may be new or changing and the patient has concerns that these could be cancer.   The following portions of the chart were reviewed this encounter and updated as appropriate:   Tobacco   Allergies   Meds   Problems   Med Hx   Surg Hx   Fam Hx      Review of Systems:  No other skin or systemic complaints except as noted in HPI or Assessment and Plan.  Objective  Well appearing patient in no apparent distress; mood and affect are within normal limits.  A full examination was performed including scalp, head, eyes, ears, nose, lips, neck, chest, axillae, abdomen, back, buttocks, bilateral upper extremities, bilateral lower extremities, hands, feet, fingers, toes, fingernails, and toenails. All findings within normal limits unless otherwise noted below.  scalp x 3 (3) Erythematous thin papules/macules with gritty scale.    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  Pt to re-start [place] and may have Radiation. Overall doing and feeling well.  PET scan does not show metastases anywhere other than left axillary lymph nodes. MRI of brain is clear.  CT of Chest; abdomen and pelvis is clear except for Left axilla lymphadenopathy. He is being treated by Dr. Burlene Arnt with [name]    AK (actinic keratosis) scalp x 3 Actinic keratoses are precancerous spots that appear secondary to cumulative UV radiation exposure/sun exposure over time. They are chronic with expected duration over 1 year. A portion of actinic keratoses will progress to squamous cell carcinoma of the skin. It is not possible to reliably predict which spots  will progress to skin cancer and so treatment is recommended to prevent development of skin cancer.  Recommend daily broad spectrum sunscreen SPF 30+ to sun-exposed areas, reapply every 2 hours as needed.  Recommend staying in the shade or wearing long sleeves, sun glasses (UVA+UVB protection) and wide brim hats (4-inch brim around the entire circumference of the hat). Call for new or changing lesions.   Destruction of lesion - scalp x 3 Complexity: simple   Destruction method: cryotherapy   Informed consent: discussed and consent obtained   Timeout:  patient name, date of birth, surgical site, and procedure verified Lesion destroyed using liquid nitrogen: Yes   Region frozen until ice ball extended beyond lesion: Yes   Outcome: patient tolerated procedure well with no complications   Post-procedure details: wound care instructions given    Related Medications fluorouracil (EFUDEX) 5 % cream Apply to scalp twice a day for 7 days,  Skin cancer screening  Lentigines - Scattered tan macules - Due to sun exposure - Benign-appearing, observe - Recommend daily broad spectrum sunscreen SPF 30+ to sun-exposed areas, reapply every 2 hours as needed. - Call for any changes  Seborrheic Keratoses - Stuck-on, waxy, tan-brown papules and/or plaques  - Benign-appearing - Discussed benign etiology and prognosis. - Observe - Call for any changes  Melanocytic Nevi - Tan-brown and/or pink-flesh-colored symmetric macules and papules - Benign appearing on exam today - Observation - Call clinic for new or changing moles - Recommend daily use of broad spectrum spf 30+ sunscreen to sun-exposed  areas.   Hemangiomas - Red papules - Discussed benign nature - Observe - Call for any changes  Actinic Damage - Severe, confluent actinic changes with pre-cancerous actinic keratoses  - Severe, chronic, not at goal, secondary to cumulative UV radiation exposure over time - diffuse scaly erythematous  macules and papules with underlying dyspigmentation - Discussed Prescription "Field Treatment" for Severe, Chronic Confluent Actinic Changes with Pre-Cancerous Actinic Keratoses  Start 5FU/Calcipotriene cream apply to scalp twice a day x 7 days,  DO NOT RECOMMEND USING THIS CREAM AT THE SAME TIME AS KEYTRUDA TREATMENT Field treatment involves treatment of an entire area of skin that has confluent Actinic Changes (Sun/ Ultraviolet light damage) and PreCancerous Actinic Keratoses by method of PhotoDynamic Therapy (PDT) and/or prescription Topical Chemotherapy agents such as 5-fluorouracil, 5-fluorouracil/calcipotriene, and/or imiquimod.  The purpose is to decrease the number of clinically evident and subclinical PreCancerous lesions to prevent progression to development of skin cancer by chemically destroying early precancer changes that may or may not be visible.  It has been shown to reduce the risk of developing skin cancer in the treated area. As a result of treatment, redness, scaling, crusting, and open sores may occur during treatment course. One or more than one of these methods may be used and may have to be used several times to control, suppress and eliminate the PreCancerous changes. Discussed treatment course, expected reaction, and possible side effects. - Recommend daily broad spectrum sunscreen SPF 30+ to sun-exposed areas, reapply every 2 hours as needed.  - Staying in the shade or wearing long sleeves, sun glasses (UVA+UVB protection) and wide brim hats (4-inch brim around the entire circumference of the hat) are also recommended. - Call for new or changing lesions.   History of Dysplastic Nevi Multiple see history  - No evidence of recurrence today - Recommend regular full body skin exams - Recommend daily broad spectrum sunscreen SPF 30+ to sun-exposed areas, reapply every 2 hours as needed.  - Call if any new or changing lesions are noted between office visits   History of Basal  Cell Carcinoma of the Skin - No evidence of recurrence today - Recommend regular full body skin exams - Recommend daily broad spectrum sunscreen SPF 30+ to sun-exposed areas, reapply every 2 hours as needed.  - Call if any new or changing lesions are noted between office visits   History of Melanoma - Left lateral infraclavicular in February 2015; S/P Wide Local excision - clear with close follow up for past 7 years.  Patient met all recommended treatment protocol and follow up. Recent discovery of symptomatic lump in Left axilla showing Melanoma by biopsy. - No evidence of recurrence today - No lymphadenopathy - Recommend regular full body skin exams - Recommend daily broad spectrum sunscreen SPF 30+ to sun-exposed areas, reapply every 2 hours as needed.  - Call if any new or changing lesions are noted between office visits   Skin cancer screening performed today.   Return in about 3 months (around 02/18/2022) for TBSE, Hx of Melanoma.  IMarye Round, CMA, am acting as scribe for Sarina Ser, MD .  Documentation: I have reviewed the above documentation for accuracy and completeness, and I agree with the above.  Sarina Ser, MD

## 2021-11-21 NOTE — Patient Instructions (Addendum)
5-Fluorouracil/Calcipotriene Patient Education  Apply to scalp twice a day x 7 days, DO NOT RECOMMEND USING THIS CREAM AT THE SAME TIME AS KEYTRUDA TREATMENT  Actinic keratoses are the dry, red scaly spots on the skin caused by sun damage. A portion of these spots can turn into skin cancer with time, and treating them can help prevent development of skin cancer.   Treatment of these spots requires removal of the defective skin cells. There are various ways to remove actinic keratoses, including freezing with liquid nitrogen, treatment with creams, or treatment with a blue light procedure in the office.   5-fluorouracil cream is a topical cream used to treat actinic keratoses. It works by interfering with the growth of abnormal fast-growing skin cells, such as actinic keratoses. These cells peel off and are replaced by healthy ones.   5-fluorouracil/calcipotriene is a combination of the 5-fluorouracil cream with a vitamin D analog cream called calcipotriene. The calcipotriene alone does not treat actinic keratoses. However, when it is combined with 5-fluorouracil, it helps the 5-fluorouracil treat the actinic keratoses much faster so that the same results can be achieved with a much shorter treatment time.  INSTRUCTIONS FOR 5-FLUOROURACIL/CALCIPOTRIENE CREAM:   5-fluorouracil/calcipotriene cream typically only needs to be used for 4-7 days. A thin layer should be applied twice a day to the treatment areas recommended by your physician.   If your physician prescribed you separate tubes of 5-fluourouracil and calcipotriene, apply a thin layer of 5-fluorouracil followed by a thin layer of calcipotriene.   Avoid contact with your eyes, nostrils, and mouth. Do not use 5-fluorouracil/calcipotriene cream on infected or open wounds.   You will develop redness, irritation and some crusting at areas where you have pre-cancer damage/actinic keratoses. IF YOU DEVELOP PAIN, BLEEDING, OR SIGNIFICANT CRUSTING,  STOP THE TREATMENT EARLY - you have already gotten a good response and the actinic keratoses should clear up well.  Wash your hands after applying 5-fluorouracil 5% cream on your skin.   A moisturizer or sunscreen with a minimum SPF 30 should be applied each morning.   Once you have finished the treatment, you can apply a thin layer of Vaseline twice a day to irritated areas to soothe and calm the areas more quickly. If you experience significant discomfort, contact your physician.  For some patients it is necessary to repeat the treatment for best results.  SIDE EFFECTS: When using 5-fluorouracil/calcipotriene cream, you may have mild irritation, such as redness, dryness, swelling, or a mild burning sensation. This usually resolves within 2 weeks. The more actinic keratoses you have, the more redness and inflammation you can expect during treatment. Eye irritation has been reported rarely. If this occurs, please let us know.  If you have any trouble using this cream, please call the office. If you have any other questions about this information, please do not hesitate to ask me before you leave the office.    Cryotherapy Aftercare  Wash gently with soap and water everyday.   Apply Vaseline and Band-Aid daily until healed.     If You Need Anything After Your Visit  If you have any questions or concerns for your doctor, please call our main line at [phone] and press option 4 to reach your doctor's medical assistant. If no one answers, please leave a voicemail as directed and we will return your call as soon as possible. Messages left after 4 pm will be answered the following business day.   You may also send [place] a message  via [facility]. We typically respond to MyChart messages within 1-2 business days.  For prescription refills, please ask your pharmacy to contact our office. Our fax number is [phone].  If you have an urgent issue when the clinic is closed that cannot wait until  the next business day, you can page your doctor at the number below.    Please note that while we do our best to be available for urgent issues outside of office hours, we are not available 24/7.   If you have an urgent issue and are unable to reach [place], you may choose to seek medical care at your doctor's office, retail clinic, urgent care center, or emergency room.  If you have a medical emergency, please immediately call 911 or go to the emergency department.  Pager Numbers  - Dr. Nehemiah Massed: [phone]  - Dr. Laurence Ferrari: [phone]  - Dr. Nicole Kindred: [phone]  In the event of inclement weather, please call our main line at [phone] for an update on the status of any delays or closures.  Dermatology Medication Tips: Please keep the boxes that topical medications come in in order to help keep track of the instructions about where and how to use these. Pharmacies typically print the medication instructions only on the boxes and not directly on the medication tubes.   If your medication is too expensive, please contact our office at [phone] option 4 or send [place] a message through [place].   We are unable to tell what your co-pay for medications will be in advance as this is different depending on your insurance coverage. However, we may be able to find a substitute medication at lower cost or fill out paperwork to get insurance to cover a needed medication.   If a prior authorization is required to get your medication covered by your insurance company, please allow [place] 1-2 business days to complete this process.  Drug prices often vary depending on where the prescription is filled and some pharmacies may offer cheaper prices.  The website www.goodrx.com contains coupons for medications through different pharmacies. The prices here do not account for what the cost may be with help from insurance (it may be cheaper with your insurance), but the website can give you the price if you did  not use any insurance.  - You can print the associated coupon and take it with your prescription to the pharmacy.  - You may also stop by our office during regular business hours and pick up a GoodRx coupon card.  - If you need your prescription sent electronically to a different pharmacy, notify our office through [facility] or by phone at [phone] option 4.     Si Usted Necesita Algo Despus de Su Visita  Tambin puede enviarnos un mensaje a travs de Pharmacist, community. Por lo general respondemos a los mensajes de MyChart en el transcurso de 1 a 2 das hbiles.  Para renovar recetas, por favor pida a su farmacia que se ponga en contacto con nuestra oficina. Harland Dingwall de fax es [place] [phone].  Si tiene un asunto urgente cuando la clnica est cerrada y que no puede esperar hasta el siguiente da hbil, puede llamar/localizar a su doctor(a) al nmero que aparece a continuacin.   Por favor, tenga en cuenta que aunque hacemos todo lo posible para estar disponibles para asuntos urgentes fuera del horario de [place], no estamos disponibles las 24 horas del da, los 7 das de la [place].   Si tiene un problema urgente y  no puede comunicarse con nosotros, puede optar por buscar atencin mdica  en el consultorio de su doctor(a), en una clnica privada, en un centro de atencin urgente o en una sala de emergencias.  Si tiene Engineering geologist, por favor llame inmediatamente al 911 o vaya a la sala de emergencias.  Nmeros de bper  - Dr. Nehemiah Massed: [phone]  - Dra. Moye: [phone]  - Dra. Nicole Kindred: [phone]  En caso de inclemencias del [place], por favor llame a Johnsie Kindred principal al [phone] para una actualizacin sobre el [place] de cualquier retraso o cierre.  Consejos para la medicacin en dermatologa: Por favor, guarde las cajas en las que vienen los medicamentos de uso tpico para ayudarle a seguir las instrucciones sobre dnde y cmo usarlos. Las  farmacias generalmente imprimen las instrucciones del medicamento slo en las cajas y no directamente en los tubos del [place].   Si su medicamento es muy caro, por favor, pngase en contacto con Zigmund Daniel llamando al [phone] y presione la opcin 4 o envenos un mensaje a travs de Pharmacist, community.   No podemos decirle cul ser su copago por los medicamentos por adelantado ya que esto es diferente dependiendo de la cobertura de su seguro. Sin embargo, es posible que podamos encontrar un medicamento sustituto a Electrical engineer un formulario para que el seguro cubra el medicamento que se considera necesario.   Si se requiere una autorizacin previa para que su compaa de seguros [place] su medicamento, por favor permtanos de 1 a 2 das hbiles para completar este proceso.  Los precios de los medicamentos varan con frecuencia dependiendo del Environmental consultant de dnde se surte la receta y alguna farmacias pueden ofrecer precios ms baratos.  El sitio web www.goodrx.com tiene cupones para medicamentos de Airline pilot. Los precios aqu no tienen en cuenta lo que podra costar con la ayuda del seguro (puede ser ms barato con su seguro), pero el sitio web puede darle el precio si no utiliz Research scientist (physical sciences).  - Puede imprimir el cupn correspondiente y llevarlo con su receta a la farmacia.  - Tambin puede pasar por nuestra oficina durante el horario de atencin regular y Charity fundraiser una tarjeta de cupones de GoodRx.  - Si necesita que su receta se enve electrnicamente a una farmacia diferente, informe a nuestra oficina a travs de MyChart de [place] o por telfono llamando al [phone] y presione la opcin 4.

## 2021-11-22 ENCOUNTER — Other Ambulatory Visit: Payer: Medicare HMO

## 2021-11-22 NOTE — Progress Notes (Signed)
Tumor Board Documentation  [name] was presented by Dr Rogue Bussing at our Tumor Board on 11/22/2021, which included representatives from medical oncology, radiology, pathology, surgical, pulmonology, research, navigation, internal medicine, radiation oncology, palliative care.  [name] currently presents as a current patient, for [place], for new positive pathology with history of the following treatments: surgical intervention(s), immunotherapy, active survellience.  Additionally, we reviewed previous medical and familial history, history of present illness, and recent lab results along with all available histopathologic and imaging studies. The tumor board considered available treatment options and made the following recommendations: Radiation therapy (primary modality) (Ax[place])    The following procedures/referrals were also placed: No orders of the defined types were placed in this encounter.   Clinical Trial Status: not discussed   Staging used: Clinical Stage AJCC Staging: T: X N: c2 M: 0 Group: Stage III Malignant Melanoma mets to Lymph   National site-specific guidelines NCCN were discussed with respect to the case.  Tumor board is a meeting of clinicians from various specialty areas who evaluate and discuss patients for whom a multidisciplinary approach is being considered. Final determinations in the plan of care are those of the provider(s). The responsibility for follow up of recommendations given during tumor board is that of the provider.   Todays extended care, comprehensive team conference, [name] was not present for the discussion and was not examined.   Multidisciplinary Tumor Board is a multidisciplinary case peer review process.  Decisions discussed in the Multidisciplinary Tumor Board reflect the opinions of the specialists present at the conference without having examined the patient.  Ultimately, treatment and diagnostic decisions rest with the primary  provider(s) and the patient.

## 2021-11-23 ENCOUNTER — Encounter: Payer: Self-pay | Admitting: Internal Medicine

## 2021-11-23 NOTE — Progress Notes (Signed)
Spoke to patient regarding his consent for rash around the sight of the train, radiating to his left chest wall/nipple raised bumps. Mild itchy. Improved. Not resolved. Recommend taking picture and attaching to my chart message recommend hydrocortisone cream topical twice a day.  Discussed regarding considering radiation/cancer conference recommendation. will continue to explore/update patient.

## 2021-11-24 ENCOUNTER — Encounter: Payer: Self-pay | Admitting: Dermatology

## 2021-11-26 ENCOUNTER — Telehealth: Payer: Self-pay

## 2021-11-26 NOTE — Telephone Encounter (Signed)
Called pt to offer [facility] appointment; however, pt has already spoken to his dermatologist. Dermatology is treating pt with Valacyclovir for shingles.

## 2021-11-26 NOTE — Telephone Encounter (Signed)
On 11/24/2021 while signing off patient's chart, I noticed some communication between the patient and Dr. Aletha Halim office which discussed a rash of the left areola and left underarm area that had itch and redness.  Photos were sent and I reviewed these.  The symptoms and signs on the photos were most consistent with shingles.  I called the patient and discussed this with him and sent him valacyclovir 1 g 1 p.o. 3 times daily dispense #42 to [facility] in [facility].  We discussed this in detail.  Dr. Sarina Ser, MD, dermatologist

## 2021-11-30 ENCOUNTER — Encounter: Payer: Self-pay | Admitting: Internal Medicine

## 2021-11-30 ENCOUNTER — Other Ambulatory Visit: Payer: Self-pay

## 2021-12-01 NOTE — Progress Notes (Signed)
I spoke to patient regarding his concerns for chest tightness, mild cough- overall improved from yesterday. He states that he will try Claritin if not improved he will call us back.  Discussed with.Dr. Lucretia Field, [place] expert at [facility]. Discussed with the patient regarding referral to [facility]; patient in agreement.  A/H/C- please make a referral to Dr.Colichio [id number].  Thanks, GB

## 2021-12-03 ENCOUNTER — Telehealth: Payer: Self-pay

## 2021-12-03 DIAGNOSIS — C779 Secondary and unspecified malignant neoplasm of lymph node, unspecified: Secondary | ICD-10-CM

## 2021-12-03 DIAGNOSIS — C439 Malignant melanoma of skin, unspecified: Secondary | ICD-10-CM

## 2021-12-03 NOTE — Telephone Encounter (Signed)
Dr. B wants patient to be referred to Dr. Lucretia Field, [place] expert at [facility].  Referral has been faxed to [place]:  [phone] F:  [phone] (NP intake coordinator)

## 2021-12-07 DIAGNOSIS — C7989 Secondary malignant neoplasm of other specified sites: Secondary | ICD-10-CM | POA: Diagnosis not present

## 2021-12-07 DIAGNOSIS — Z85820 Personal history of malignant melanoma of skin: Secondary | ICD-10-CM | POA: Diagnosis not present

## 2021-12-10 ENCOUNTER — Inpatient Hospital Stay: Payer: Medicare HMO | Attending: Oncology

## 2021-12-10 ENCOUNTER — Inpatient Hospital Stay: Payer: Medicare HMO

## 2021-12-10 ENCOUNTER — Inpatient Hospital Stay: Payer: Medicare HMO | Admitting: Internal Medicine

## 2021-12-10 ENCOUNTER — Encounter: Payer: Self-pay | Admitting: Internal Medicine

## 2021-12-10 ENCOUNTER — Other Ambulatory Visit: Payer: Self-pay

## 2021-12-10 DIAGNOSIS — B029 Zoster without complications: Secondary | ICD-10-CM | POA: Insufficient documentation

## 2021-12-10 DIAGNOSIS — C4359 Malignant melanoma of other part of trunk: Secondary | ICD-10-CM | POA: Insufficient documentation

## 2021-12-10 DIAGNOSIS — Z801 Family history of malignant neoplasm of trachea, bronchus and lung: Secondary | ICD-10-CM | POA: Diagnosis not present

## 2021-12-10 DIAGNOSIS — C773 Secondary and unspecified malignant neoplasm of axilla and upper limb lymph nodes: Secondary | ICD-10-CM | POA: Insufficient documentation

## 2021-12-10 DIAGNOSIS — C439 Malignant melanoma of skin, unspecified: Secondary | ICD-10-CM

## 2021-12-10 DIAGNOSIS — C779 Secondary and unspecified malignant neoplasm of lymph node, unspecified: Secondary | ICD-10-CM | POA: Diagnosis not present

## 2021-12-10 DIAGNOSIS — Z5112 Encounter for antineoplastic immunotherapy: Secondary | ICD-10-CM | POA: Diagnosis not present

## 2021-12-10 LAB — COMPREHENSIVE METABOLIC PANEL
ALT: 20 U/L (ref 0–44)
AST: 27 U/L (ref 15–41)
Albumin: 3.7 g/dL (ref 3.5–5.0)
Alkaline Phosphatase: 55 U/L (ref 38–126)
Anion gap: 8 (ref 5–15)
BUN: 18 mg/dL (ref 8–23)
CO2: 26 mmol/L (ref 22–32)
Calcium: 9 mg/dL (ref 8.9–10.3)
Chloride: 103 mmol/L (ref 98–111)
Creatinine, Ser: 1.33 mg/dL — ABNORMAL HIGH (ref 0.61–1.24)
GFR, Estimated: 58 mL/min — ABNORMAL LOW (ref >60)
Glucose, Bld: 87 mg/dL (ref 70–99)
Potassium: 3.9 mmol/L (ref 3.5–5.1)
Sodium: 137 mmol/L (ref 135–145)
Total Bilirubin: 0.2 mg/dL — ABNORMAL LOW (ref 0.3–1.2)
Total Protein: 6.6 g/dL (ref 6.5–8.1)

## 2021-12-10 LAB — CBC WITH DIFFERENTIAL/PLATELET
Abs Immature Granulocytes: 0.02 10*3/uL (ref 0.00–0.07)
Basophils Absolute: 0.1 10*3/uL (ref 0.0–0.1)
Basophils Relative: 1 %
Eosinophils Absolute: 0.3 10*3/uL (ref 0.0–0.5)
Eosinophils Relative: 6 %
HCT: 43.2 % (ref 39.0–52.0)
Hemoglobin: 14.4 g/dL (ref 13.0–17.0)
Immature Granulocytes: 0 %
Lymphocytes Relative: 34 %
Lymphs Abs: 1.7 10*3/uL (ref 0.7–4.0)
MCH: 30.6 pg (ref 26.0–34.0)
MCHC: 33.3 g/dL (ref 30.0–36.0)
MCV: 91.9 fL (ref 80.0–100.0)
Monocytes Absolute: 0.4 10*3/uL (ref 0.1–1.0)
Monocytes Relative: 8 %
Neutro Abs: 2.6 10*3/uL (ref 1.7–7.7)
Neutrophils Relative %: 51 %
Platelets: 247 10*3/uL (ref 150–400)
RBC: 4.7 MIL/uL (ref 4.22–5.81)
RDW: 13.7 % (ref 11.5–15.5)
WBC: 5 10*3/uL (ref 4.0–10.5)
nRBC: 0 % (ref 0.0–0.2)

## 2021-12-10 MED ORDER — SODIUM CHLORIDE 0.9 % IV SOLN
Freq: Once | INTRAVENOUS | Status: AC
  Filled 2021-12-10: qty 250

## 2021-12-10 MED ORDER — SODIUM CHLORIDE 0.9 % IV SOLN
200.0000 mg | Freq: Once | INTRAVENOUS | Status: AC
  Administered 2021-12-10: 200 mg via INTRAVENOUS
  Filled 2021-12-10: qty 8

## 2021-12-10 NOTE — Progress Notes (Signed)
Pt states he has an appt with [facility] oncology next Monday. ?

## 2021-12-10 NOTE — Patient Instructions (Signed)
[facility]  Discharge Instructions: °Thank you for choosing [facility] to provide your oncology and hematology care.  °If you have a lab appointment with the Cancer Center, please go directly to the Cancer Center and check in at the registration area. ° °Wear comfortable clothing and clothing appropriate for easy access to any Portacath or PICC line.  ° °We strive to give you quality time with your provider. You may need to reschedule your appointment if you arrive late (15 or more minutes).  Arriving late affects you and other patients whose appointments are after yours.  Also, if you miss three or more appointments without notifying the office, you may be dismissed from the clinic at the provider’s discretion.    °  °For prescription refill requests, have your pharmacy contact our office and allow 72 hours for refills to be completed.   ° °Today you received the following chemotherapy and/or immunotherapy agents Keytruda °    °  °To help prevent nausea and vomiting after your treatment, we encourage you to take your nausea medication as directed. ° °BELOW ARE SYMPTOMS THAT SHOULD BE REPORTED IMMEDIATELY: °*FEVER GREATER THAN 100.4 F (38 °C) OR HIGHER °*CHILLS OR SWEATING °*NAUSEA AND VOMITING THAT IS NOT CONTROLLED WITH YOUR NAUSEA MEDICATION °*UNUSUAL SHORTNESS OF BREATH °*UNUSUAL BRUISING OR BLEEDING °*URINARY PROBLEMS (pain or burning when urinating, or frequent urination) °*BOWEL PROBLEMS (unusual diarrhea, constipation, pain near the anus) °TENDERNESS IN MOUTH AND THROAT WITH OR WITHOUT PRESENCE OF ULCERS (sore throat, sores in mouth, or a toothache) °UNUSUAL RASH, SWELLING OR PAIN  °UNUSUAL VAGINAL DISCHARGE OR ITCHING  ° °Items with * indicate a potential emergency and should be followed up as soon as possible or go to the Emergency Department if any problems should occur. ° °Please show the CHEMOTHERAPY ALERT CARD or IMMUNOTHERAPY ALERT CARD at check-in to  the Emergency Department and triage nurse. ° °Should you have questions after your visit or need to cancel or reschedule your appointment, please contact [facility]  [phone] and follow the prompts.  Office hours are 8:00 a.m. to 4:30 p.m. Monday - Friday. Please note that voicemails left after 4:00 p.m. may not be returned until the following business day.  We are closed weekends and major holidays. You have access to a nurse at all times for urgent questions. Please call the main number to the clinic [phone] and follow the prompts. ° °For any non-urgent questions, you may also contact your provider using MyChart. We now offer e-Visits for anyone 18 and older to request care online for non-urgent symptoms. For details visit [email or web address]. °  °Also download the MyChart app! Go to the app store, search "MyChart", open the app, select [facility], and log in with your MyChart username and password. ° °Due to Covid, a mask is required upon entering the hospital/clinic. If you do not have a mask, one will be given to you upon arrival. For doctor visits, patients may have 1 support person aged 18 or older with them. For treatment visits, patients cannot have anyone with them due to current Covid guidelines and our immunocompromised population.  °

## 2021-12-10 NOTE — Assessment & Plan Note (Addendum)
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  S/p neoadjuvant Keytruda x4- -Status post axillary lymph node dissection-significant partial response noted-  FOUR OF THIRTEEN LYMPH NODES INVOLVED BY METASTATIC MELANOMA (4/13)-however,  majority of these foci measure 1-2 mm in greatest dimension, with the largest measuring 4 mm.  With regards to axilla radiation discussed-that the management is evolving.  Given the micrometastatic disease I think it is reasonable to hold off radiation.  However I would recommend evaluation with melanoma specialist.  Patient is appointment at [facility] on 3/07.  ? ?# proceed with with [place] #6 today of planned total 18. Labs today reviewed;  acceptable for treatment today. FEB TSH- WNL ? ?# Shingles- s/p valacyclovir improved.  ? ?# DISPOSITION: needs Monday/early AM schedule ?# Nat Math today ?# follow up in 3 weeks- MD; ; labs- cbc/cmp; Keytruda-- Dr.B ? ? ?

## 2021-12-10 NOTE — Progress Notes (Signed)
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   There is sufficient material for ancillary molecular testing if needed  (block A3, A1, A2).   Comment:  Per provided outside pathology report the patient had an "at least pT1b"  malignant melanoma of the left lateral infraclavicular skin in 2015.  Touch preparations demonstrate predominantly discohesive large  pleomorphic cells, some with binucleation.  HE sections demonstrate  lymph node parenchyma involved by metastatic neoplasm.  The neoplastic  cells are pleomorphic with binucleation, focal intranuclear inclusions,  and associated tumor necrosis. The neoplastic cells demonstrate the  following pattern of immunoreactivity:  S100: Positive  SOX10: Positive  HMB-45: Negative  Melan-A: Negative  Super pancytokeratin: Negative  CD45: Negative  This pattern of immunoreactivity supports the above diagnosis.   # NOV 7th, 2022-neoadjuvant Keytruda cycle #1  # JAN, 26th, [name],  majority of these foci measure 1-2 mm in greatest dimension, with the largest measuring 4 mm.  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  Accompanied by his wife.  [name] 66 y.o.  male pleasant patient above history of recurrent melanoma left axillary lymphadenopathy-currently on adjuvant [name] is here for follow-up.  Patient also diagnosed with shingles around the left chest wall.-status post valacyclovir.  Lesions have resolved.  Since the last visit the patient had a mild cough chest tightness shortness of breath on exertion.  Symptoms resolved with antihistamines.  Appetite is good.  No weight loss no nausea vomiting.  Review of Systems  Constitutional:  Negative for chills, diaphoresis, fever, malaise/fatigue and weight loss.  HENT:  Negative for nosebleeds and sore throat.   Eyes:  Negative for double vision.  Respiratory:  Negative for hemoptysis, sputum production, shortness of breath and wheezing.   Cardiovascular:  Negative for chest pain, palpitations, orthopnea and leg swelling.  Gastrointestinal:  Negative for abdominal pain, blood in stool, constipation, diarrhea, heartburn, melena, nausea and vomiting.  Genitourinary:  Negative for dysuria, frequency and urgency.  Musculoskeletal:  Negative for back pain and joint pain.  Skin: Negative.  Negative for itching and rash.  Neurological:  Negative for dizziness, tingling, focal weakness and weakness.  Endo/Heme/Allergies:  Does not bruise/bleed easily.  Psychiatric/Behavioral:  Negative for depression. The patient is not nervous/anxious and does not have insomnia.      PAST MEDICAL HISTORY :  Past Medical History:  Diagnosis Date   Basal cell carcinoma 03/10/2013   Right medial forehead. Excised, margins free.   Dysplastic nevus  11/05/2006   Mid back paraspinal, [name], close to edge. Excised 12/24/2006, margins free.   Dysplastic nevus 12/15/2008   Left lat. pectoral area. Moderate atypia, extends to one edge.    Dysplastic nevus 12/07/2009   Left mid side. Moderate atypia, close to margin.   Dysplastic nevus 12/07/2009   Right posterior waistline. Moderate atypia, close to margin.   Dysplastic nevus 02/10/2014   Left paraspinal mid back. Moderate atypia, lateral and deep margin involved.    Dysplastic nevus 06/16/2014   Left epigastric. Mild atypia, deep margin involved.    GERD (gastroesophageal reflux disease)    Hx of basal cell carcinoma    multiple sites   Hx of malignant melanoma 11/11/2013   L lateral infraclavicular, superficial spreading, Breslow's 0.59m, Clark's level III   Hypothyroidism    Melanoma ([place] 11/11/2013   Left lateral infraclavicular. MM, superficial spreading. Anatomic level III. Tumor thickness 0.652m Excised 11/24/2013, margins free.     PAST SURGICAL HISTORY :   Past Surgical History:  Procedure Laterality Date   AXILLARY LYMPH NODE DISSECTION Left 11/02/2021   Procedure: AXILLARY LYMPH NODE DISSECTION;  [name];  [name];  Service: General;  Laterality: Left;   COLONOSCOPY WITH PROPOFOL N/A 01/20/2017   Procedure: COLONOSCOPY WITH PROPOFOL;  [name];  [facility];  Service: Endoscopy;  [name];   ESOPHAGOGASTRODUODENOSCOPY (EGD) WITH ESOPHAGEAL DILATION     MELANOMA EXCISION Right 11/24/2013   lateral infraclavicular   WISDOM TOOTH EXTRACTION     WRIST SURGERY Left 2022    FAMILY HISTORY :   Family History  Problem Relation Age of Onset   Macular degeneration Mother    Hyperlipidemia Mother    Heart disease Father    Heart attack Father    Lung cancer Father        metastasized    SOCIAL HISTORY:   Social History   Tobacco Use   Smoking status:  Never   Smokeless tobacco: Never  Vaping Use   Vaping Use: Never used  Substance Use Topics   Alcohol use: Yes    Comment: about 1-3 glass of wine with dinner   Drug use: No    ALLERGIES:  has No Known Allergies.  MEDICATIONS:  Current Outpatient Medications  Medication Sig Dispense Refill   aspirin 81 MG tablet Take 81 mg by mouth daily.     fluorouracil (EFUDEX) 5 % cream Apply to scalp twice a day for 7 days, 40 g 1   levothyroxine (SYNTHROID) 50 MCG tablet TAKE 1 TABLET BY MOUTH EVERY DAY 90 tablet 1   Multiple Vitamins tablet Take 1 tablet by mouth daily.     Multiple Vitamins-Minerals (PRESERVISION AREDS 2 PO) Take 1 capsule by mouth in the morning and at bedtime.     pantoprazole (PROTONIX) 20 MG tablet Take 20 mg by mouth daily.  0   Pembrolizumab (KEYTRUDA IV) Inject 1 Dose into the vein every 21 ( twenty-one) days.     HYDROcodone-acetaminophen (NORCO/VICODIN) 5-325 MG tablet Take 1 tablet by mouth every 4 (four) hours as needed for moderate pain. (Patient not taking: Reported on 11/16/2021) 15 tablet 0   No current facility-administered medications for this visit.    PHYSICAL EXAMINATION: ECOG PERFORMANCE STATUS: 0 - Asymptomatic  BP (!Marland Kitchen  135/91 (BP Location: Right Arm, Patient Position: Sitting, Cuff Size: Normal)    Pulse 62    Temp (!) 96.8 F (36 C) (Tympanic)    Ht '6\' 1"'$  (1.854 m)    Wt 197 lb 3.2 oz (89.4 kg)    SpO2 99%    BMI 26.02 kg/m   Filed Weights   12/10/21 0841  Weight: 197 lb 3.2 oz (89.4 kg)   Positive for lymph node left underarm approximate 2 to 3 cm in size.  Physical Exam Vitals and nursing note reviewed.  HENT:     Head: Normocephalic and atraumatic.     Mouth/Throat:     Pharynx: Oropharynx is clear.  Eyes:     Extraocular Movements: Extraocular movements intact.     Pupils: Pupils are equal, round, and reactive to light.  Cardiovascular:     Rate and Rhythm: Normal rate and regular rhythm.  Pulmonary:     Comments: Decreased breath  sounds bilaterally.  Abdominal:     Palpations: Abdomen is soft.  Musculoskeletal:        General: Normal range of motion.     Cervical back: Normal range of motion.  Skin:    General: Skin is warm.  Neurological:     General: No focal deficit present.     Mental Status: He is alert and oriented to person, place, and time.  Psychiatric:        Behavior: Behavior normal.        Judgment: Judgment normal.       LABORATORY DATA:  I have reviewed the data as listed    Component Value Date/Time   NA 137 12/10/2021 0814   NA 145 (H) 06/26/2021 1416   K 3.9 12/10/2021 0814   CL 103 12/10/2021 0814   CO2 26 12/10/2021 0814   GLUCOSE 87 12/10/2021 0814   BUN 18 12/10/2021 0814   BUN 16 06/26/2021 1416   CREATININE 1.33 (H) 12/10/2021 0814   CALCIUM 9.0 12/10/2021 0814   PROT 6.6 12/10/2021 0814   PROT 6.7 06/26/2021 1416   ALBUMIN 3.7 12/10/2021 0814   ALBUMIN 4.6 06/26/2021 1416   AST 27 12/10/2021 0814   ALT 20 12/10/2021 0814   ALKPHOS 55 12/10/2021 0814   BILITOT 0.2 (L) 12/10/2021 0814   BILITOT 0.4 06/26/2021 1416   GFRNONAA 58 (L) 12/10/2021 0814   GFRAA 75 05/10/2020 1017    No results found for: SPEP, UPEP  Lab Results  Component Value Date   WBC 5.0 12/10/2021   NEUTROABS 2.6 12/10/2021   HGB 14.4 12/10/2021   HCT 43.2 12/10/2021   MCV 91.9 12/10/2021   PLT 247 12/10/2021      Chemistry      Component Value Date/Time   NA 137 12/10/2021 0814   NA 145 (H) 06/26/2021 1416   K 3.9 12/10/2021 0814   CL 103 12/10/2021 0814   CO2 26 12/10/2021 0814   BUN 18 12/10/2021 0814   BUN 16 06/26/2021 1416   CREATININE 1.33 (H) 12/10/2021 0814   GLU 96 10/19/2014 0000      Component Value Date/Time   CALCIUM 9.0 12/10/2021 0814   ALKPHOS 55 12/10/2021 0814   AST 27 12/10/2021 0814   ALT 20 12/10/2021 0814   BILITOT 0.2 (L) 12/10/2021 0814   BILITOT 0.4 06/26/2021 1416       RADIOGRAPHIC STUDIES: I have personally reviewed the radiological images as  listed and agreed with the findings in the report. No results  found.   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  S/p neoadjuvant Keytruda x4- -Status post axillary lymph node dissection-significant partial response noted-  FOUR OF THIRTEEN LYMPH NODES INVOLVED BY METASTATIC MELANOMA (4/13)-however,  majority of these foci measure 1-2 mm in greatest dimension, with the largest measuring 4 mm.  With regards to axilla radiation discussed-that the management is evolving.  Given the micrometastatic disease I think it is reasonable to hold off radiation.  However I would recommend evaluation with melanoma specialist.  Patient is appointment at [facility] on 3/07.   # proceed with with [place] #6 today of planned total 18. Labs today reviewed;  acceptable for treatment today. FEB TSH- WNL  # Shingles- s/p valacyclovir improved.   # DISPOSITION: needs Monday/early AM schedule # Nat Math today # follow up in 3 weeks- MD; ; labs- cbc/cmp; Keytruda-- Dr.B     No orders of the defined types were placed in this encounter.   All questions were answered. The patient knows to call the clinic with any problems, questions or concerns.      Cammie Sickle, MD 12/10/2021 1:10 PM

## 2021-12-13 NOTE — Telephone Encounter (Signed)
Patient is scheduled an appt with Dr. Harriet Masson on 03/75/4360 ?

## 2021-12-17 DIAGNOSIS — C439 Malignant melanoma of skin, unspecified: Secondary | ICD-10-CM | POA: Diagnosis not present

## 2021-12-17 DIAGNOSIS — C779 Secondary and unspecified malignant neoplasm of lymph node, unspecified: Secondary | ICD-10-CM | POA: Diagnosis not present

## 2021-12-19 DIAGNOSIS — M9901 Segmental and somatic dysfunction of cervical region: Secondary | ICD-10-CM | POA: Diagnosis not present

## 2021-12-19 DIAGNOSIS — M9903 Segmental and somatic dysfunction of lumbar region: Secondary | ICD-10-CM | POA: Diagnosis not present

## 2021-12-19 DIAGNOSIS — M5033 Other cervical disc degeneration, cervicothoracic region: Secondary | ICD-10-CM | POA: Diagnosis not present

## 2021-12-19 DIAGNOSIS — M62830 Muscle spasm of back: Secondary | ICD-10-CM | POA: Diagnosis not present

## 2021-12-23 ENCOUNTER — Other Ambulatory Visit: Payer: Self-pay | Admitting: Internal Medicine

## 2021-12-31 ENCOUNTER — Inpatient Hospital Stay: Payer: Medicare HMO

## 2021-12-31 ENCOUNTER — Other Ambulatory Visit: Payer: Self-pay

## 2021-12-31 ENCOUNTER — Inpatient Hospital Stay: Payer: Medicare HMO | Admitting: Oncology

## 2021-12-31 VITALS — BP 141/97 | HR 67 | Temp 97.1°F | Resp 16 | Ht 73.0 in | Wt 199.8 lb

## 2021-12-31 DIAGNOSIS — C439 Malignant melanoma of skin, unspecified: Secondary | ICD-10-CM

## 2021-12-31 DIAGNOSIS — B029 Zoster without complications: Secondary | ICD-10-CM | POA: Diagnosis not present

## 2021-12-31 DIAGNOSIS — C779 Secondary and unspecified malignant neoplasm of lymph node, unspecified: Secondary | ICD-10-CM | POA: Diagnosis not present

## 2021-12-31 DIAGNOSIS — Z5112 Encounter for antineoplastic immunotherapy: Secondary | ICD-10-CM | POA: Diagnosis not present

## 2021-12-31 DIAGNOSIS — Z801 Family history of malignant neoplasm of trachea, bronchus and lung: Secondary | ICD-10-CM | POA: Diagnosis not present

## 2021-12-31 DIAGNOSIS — C4359 Malignant melanoma of other part of trunk: Secondary | ICD-10-CM | POA: Diagnosis not present

## 2021-12-31 DIAGNOSIS — C773 Secondary and unspecified malignant neoplasm of axilla and upper limb lymph nodes: Secondary | ICD-10-CM | POA: Diagnosis not present

## 2021-12-31 LAB — COMPREHENSIVE METABOLIC PANEL
ALT: 18 U/L (ref 0–44)
AST: 30 U/L (ref 15–41)
Albumin: 3.9 g/dL (ref 3.5–5.0)
Alkaline Phosphatase: 47 U/L (ref 38–126)
Anion gap: 8 (ref 5–15)
BUN: 18 mg/dL (ref 8–23)
CO2: 23 mmol/L (ref 22–32)
Calcium: 8.7 mg/dL — ABNORMAL LOW (ref 8.9–10.3)
Chloride: 107 mmol/L (ref 98–111)
Creatinine, Ser: 1.1 mg/dL (ref 0.61–1.24)
GFR, Estimated: >60 mL/min (ref >60)
Glucose, Bld: 93 mg/dL (ref 70–99)
Potassium: 3.8 mmol/L (ref 3.5–5.1)
Sodium: 138 mmol/L (ref 135–145)
Total Bilirubin: 0.3 mg/dL (ref 0.3–1.2)
Total Protein: 6.3 g/dL — ABNORMAL LOW (ref 6.5–8.1)

## 2021-12-31 LAB — CBC WITH DIFFERENTIAL/PLATELET
Abs Immature Granulocytes: 0.03 10*3/uL (ref 0.00–0.07)
Basophils Absolute: 0.1 10*3/uL (ref 0.0–0.1)
Basophils Relative: 1 %
Eosinophils Absolute: 0.2 10*3/uL (ref 0.0–0.5)
Eosinophils Relative: 6 %
HCT: 42.3 % (ref 39.0–52.0)
Hemoglobin: 14.4 g/dL (ref 13.0–17.0)
Immature Granulocytes: 1 %
Lymphocytes Relative: 37 %
Lymphs Abs: 1.6 10*3/uL (ref 0.7–4.0)
MCH: 31.1 pg (ref 26.0–34.0)
MCHC: 34 g/dL (ref 30.0–36.0)
MCV: 91.4 fL (ref 80.0–100.0)
Monocytes Absolute: 0.4 10*3/uL (ref 0.1–1.0)
Monocytes Relative: 9 %
Neutro Abs: 2 10*3/uL (ref 1.7–7.7)
Neutrophils Relative %: 46 %
Platelets: 212 10*3/uL (ref 150–400)
RBC: 4.63 MIL/uL (ref 4.22–5.81)
RDW: 14.9 % (ref 11.5–15.5)
WBC: 4.3 10*3/uL (ref 4.0–10.5)
nRBC: 0 % (ref 0.0–0.2)

## 2021-12-31 MED ORDER — SODIUM CHLORIDE 0.9 % IV SOLN
200.0000 mg | Freq: Once | INTRAVENOUS | Status: AC
  Administered 2021-12-31: 200 mg via INTRAVENOUS
  Filled 2021-12-31: qty 8

## 2021-12-31 MED ORDER — SODIUM CHLORIDE 0.9 % IV SOLN
Freq: Once | INTRAVENOUS | Status: AC
  Filled 2021-12-31: qty 250

## 2021-12-31 NOTE — Progress Notes (Signed)
?[place]  ?[phone] [phone] ? ?ID: [name] OB: 12/14/1955  MR#: [id number]  SAY#:[id number] ? ?Patient Care Team: ?Jerrol Banana., [name], MD as Consulting Physician (Oncology) ? ?CHIEF COMPLAINT: Stage III melanoma ? ?INTERVAL HISTORY: Patient returns to clinic today for further evaluation and consideration of cycle 7 of adjuvant Keytruda.  He is tolerating his treatments well without significant side effects.  He currently feels well and is asymptomatic.  He has no neurologic complaints.  He denies any recent fevers or illnesses.  He has good appetite and denies weight loss.  He has no chest pain, shortness of breath, cough, or hemoptysis.  He denies any nausea, vomiting, constipation, or diarrhea.  He has no urinary complaints.  Patient feels at his baseline and offers no specific complaints today. ? ?REVIEW OF SYSTEMS:   ?Review of Systems  ?Constitutional: Negative.  Negative for fever, malaise/fatigue and weight loss.  ?Respiratory: Negative.  Negative for cough, hemoptysis and shortness of breath.   ?Cardiovascular: Negative.  Negative for chest pain and leg swelling.  ?Gastrointestinal: Negative.  Negative for abdominal pain.  ?Genitourinary: Negative.  Negative for dysuria.  ?Musculoskeletal: Negative.  Negative for back pain.  ?Skin: Negative.  Negative for rash.  ?Neurological: Negative.  Negative for dizziness, focal weakness, weakness and headaches.  ?Psychiatric/Behavioral: Negative.  The patient is not nervous/anxious.   ? ?As per HPI. Otherwise, a complete review of systems is negative. ? ?PAST MEDICAL HISTORY: ?Past Medical History:  ?Diagnosis Date  ? Basal cell carcinoma 03/10/2013  ? Right medial forehead. Excised, margins free.  ? Dysplastic nevus 11/05/2006  ? Mid back paraspinal, [name], close to edge. Excised 12/24/2006, margins free.  ?  Dysplastic nevus 12/15/2008  ? Left lat. pectoral area. Moderate atypia, extends to one edge.   ? Dysplastic nevus 12/07/2009  ? Left mid side. Moderate atypia, close to margin.  ? Dysplastic nevus 12/07/2009  ? Right posterior waistline. Moderate atypia, close to margin.  ? Dysplastic nevus 02/10/2014  ? Left paraspinal mid back. Moderate atypia, lateral and deep margin involved.   ? Dysplastic nevus 06/16/2014  ? Left epigastric. Mild atypia, deep margin involved.   ? GERD (gastroesophageal reflux disease)   ? Hx of basal cell carcinoma   ? multiple sites  ? Hx of malignant melanoma 11/11/2013  ? L lateral infraclavicular, superficial spreading, Breslow's 0.86m, Clark's level III  ? Hypothyroidism   ? Melanoma ([place] 11/11/2013  ? Left lateral infraclavicular. MM, superficial spreading. Anatomic level III. Tumor thickness 0.63m Excised 11/24/2013, margins free.   ? ? ?PAST SURGICAL HISTORY: ?Past Surgical History:  ?Procedure Laterality Date  ? AXILLARY LYMPH NODE DISSECTION Left 11/02/2021  ? Procedure: AXILLARY LYMPH NODE DISSECTION;  [name];  [name];  Service: General;  Laterality: Left;  ? COLONOSCOPY WITH PROPOFOL N/A 01/20/2017  ? Procedure: COLONOSCOPY WITH PROPOFOL;  [name];  [facility];  Service: Endoscopy;  [name];  ? ESOPHAGOGASTRODUODENOSCOPY (EGD) WITH ESOPHAGEAL DILATION    ? MELANOMA EXCISION Right 11/24/2013  ? lateral infraclavicular  ? WISDOM TOOTH EXTRACTION    ? WRIST SURGERY Left 2022  ? ? ?FAMILY HISTORY: ?Family History  ?Problem Relation Age of Onset  ? Macular degeneration Mother   ? Hyperlipidemia Mother   ? Heart disease Father   ? Heart attack Father   ? Lung  cancer Father   ?     metastasized  ? ? ?ADVANCED DIRECTIVES (Y/N):  N ? ?HEALTH MAINTENANCE: ?Social History  ? ?Tobacco Use  ? Smoking status: Never  ? Smokeless tobacco: Never  ?Vaping Use  ? Vaping Use: Never used  ?Substance Use Topics  ?  Alcohol use: Yes  ?  Comment: about 1-3 glass of wine with dinner  ? Drug use: No  ? ? ? Colonoscopy: ? PAP: ? Bone density: ? Lipid panel: ? ?No Known Allergies ? ?Current Outpatient Medications  ?Medication Sig Dispense Refill  ? aspirin 81 MG tablet Take 81 mg by mouth daily.    ? levothyroxine (SYNTHROID) 50 MCG tablet TAKE 1 TABLET BY MOUTH EVERY DAY 90 tablet 1  ? Multiple Vitamins tablet Take 1 tablet by mouth daily.    ? Multiple Vitamins-Minerals (PRESERVISION AREDS 2 PO) Take 1 capsule by mouth in the morning and at bedtime.    ? pantoprazole (PROTONIX) 20 MG tablet Take 20 mg by mouth daily.  0  ? Pembrolizumab (KEYTRUDA IV) Inject 1 Dose into the vein every 21 ( twenty-one) days.    ? fluorouracil (EFUDEX) 5 % cream Apply to scalp twice a day for 7 days, (Patient not taking: Reported on 12/31/2021) 40 g 1  ? ?No current facility-administered medications for this visit.  ? ?Facility-Administered Medications Ordered in Other Visits  ?Medication Dose Route Frequency Provider Last Rate Last Admin  ? pembrolizumab (KEYTRUDA) 200 mg in sodium chloride 0.9 % 50 mL chemo infusion  200 mg Intravenous Once Cammie Sickle, MD      ? ? ?OBJECTIVE: ?Vitals:  ? 12/31/21 0905  ?BP: (!) 141/97  ?Pulse: 67  ?Resp: 16  ?Temp: (!) 97.1 ?F (36.2 ?C)  ?SpO2: 99%  ?   Body mass index is 26.36 kg/m?Marland Kitchen    ECOG FS:0 - Asymptomatic ? ?General: Well-developed, well-nourished, no acute distress. ?Eyes: Pink conjunctiva, anicteric sclera. ?HEENT: Normocephalic, moist mucous membranes. ?Lungs: No audible wheezing or coughing. ?Heart: Regular rate and rhythm. ?Abdomen: Soft, nontender, no obvious distention. ?Musculoskeletal: No edema, cyanosis, or clubbing. ?Neuro: Alert, answering all questions appropriately. Cranial nerves grossly intact. ?Skin: No rashes or petechiae noted. ?Psych: Normal affect. ? ? ?LAB RESULTS: ? ?Lab Results  ?Component Value Date  ? NA 138 12/31/2021  ? K 3.8 12/31/2021  ? CL 107 12/31/2021  ? CO2 23  12/31/2021  ? GLUCOSE 93 12/31/2021  ? BUN 18 12/31/2021  ? CREATININE 1.10 12/31/2021  ? CALCIUM 8.7 (L) 12/31/2021  ? PROT 6.3 (L) 12/31/2021  ? ALBUMIN 3.9 12/31/2021  ? AST 30 12/31/2021  ? ALT 18 12/31/2021  ? ALKPHOS 47 12/31/2021  ? BILITOT 0.3 12/31/2021  ? GFRNONAA >60 12/31/2021  ? GFRAA 75 05/10/2020  ? ? ?Lab Results  ?Component Value Date  ? WBC 4.3 12/31/2021  ? NEUTROABS 2.0 12/31/2021  ? HGB 14.4 12/31/2021  ? HCT 42.3 12/31/2021  ? MCV 91.4 12/31/2021  ? PLT 212 12/31/2021  ? ? ? ?STUDIES: ?No results found. ? ?ASSESSMENT: Stage III melanoma ? ?PLAN:   ? ?1.  Stage III melanoma: Patient is receiving adjuvant Keytruda every 3 weeks for total of 1 year.  He recently had a second opinion at [facility] who agreed with the current treatment plan with no additional recommendations.  There is no role for adjuvant XRT.  Proceed with cycle 7 of Keytruda today.  Return to clinic in 3 weeks for further evaluation and consideration of cycle  8.   ? ?I spent a total of 30 minutes reviewing chart data, face-to-face evaluation with the patient, counseling and coordination of care as detailed above. ? ?Patient expressed understanding and was in agreement with this plan. He also understands that He can call clinic at any time with any questions, concerns, or complaints.  ? ? 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 ? ?Lloyd Huger, MD   12/31/2021 9:51 AM ? ? ? ? ?

## 2021-12-31 NOTE — Progress Notes (Signed)
Pt has questions/concerns about recent [facility] appointment. ?

## 2021-12-31 NOTE — Patient Instructions (Signed)
[facility]  Discharge Instructions: Thank you for choosing [facility] to provide your oncology and hematology care.  If you have a lab appointment with the Cancer Center, please go directly to the Cancer Center and check in at the registration area.  Wear comfortable clothing and clothing appropriate for easy access to any Portacath or PICC line.   We strive to give you quality time with your provider. You may need to reschedule your appointment if you arrive late (15 or more minutes).  Arriving late affects you and other patients whose appointments are after yours.  Also, if you miss three or more appointments without notifying the office, you may be dismissed from the clinic at the provider's discretion.      For prescription refill requests, have your pharmacy contact our office and allow 72 hours for refills to be completed.    Today you received the following chemotherapy and/or immunotherapy agents keytruda   To help prevent nausea and vomiting after your treatment, we encourage you to take your nausea medication as directed.  BELOW ARE SYMPTOMS THAT SHOULD BE REPORTED IMMEDIATELY: *FEVER GREATER THAN 100.4 F (38 C) OR HIGHER *CHILLS OR SWEATING *NAUSEA AND VOMITING THAT IS NOT CONTROLLED WITH YOUR NAUSEA MEDICATION *UNUSUAL SHORTNESS OF BREATH *UNUSUAL BRUISING OR BLEEDING *URINARY PROBLEMS (pain or burning when urinating, or frequent urination) *BOWEL PROBLEMS (unusual diarrhea, constipation, pain near the anus) TENDERNESS IN MOUTH AND THROAT WITH OR WITHOUT PRESENCE OF ULCERS (sore throat, sores in mouth, or a toothache) UNUSUAL RASH, SWELLING OR PAIN  UNUSUAL VAGINAL DISCHARGE OR ITCHING   Items with * indicate a potential emergency and should be followed up as soon as possible or go to the Emergency Department if any problems should occur.  Please show the CHEMOTHERAPY ALERT CARD or IMMUNOTHERAPY ALERT CARD at check-in to the  Emergency Department and triage nurse.  Should you have questions after your visit or need to cancel or reschedule your appointment, please contact [facility]  [phone] and follow the prompts.  Office hours are 8:00 a.m. to 4:30 p.m. Monday - Friday. Please note that voicemails left after 4:00 p.m. may not be returned until the following business day.  We are closed weekends and major holidays. You have access to a nurse at all times for urgent questions. Please call the main number to the clinic [phone] and follow the prompts.  For any non-urgent questions, you may also contact your provider using MyChart. We now offer e-Visits for anyone 18 and older to request care online for non-urgent symptoms. For details visit [email or web address].   Also download the MyChart app! Go to the app store, search "MyChart", open the app, select [place], and log in with your MyChart username and password.  Due to Covid, a mask is required upon entering the hospital/clinic. If you do not have a mask, one will be given to you upon arrival. For doctor visits, patients may have 1 support person aged 18 or older with them. For treatment visits, patients cannot have anyone with them due to current Covid guidelines and our immunocompromised population.  

## 2022-01-03 DIAGNOSIS — D485 Neoplasm of uncertain behavior of skin: Secondary | ICD-10-CM | POA: Diagnosis not present

## 2022-01-03 DIAGNOSIS — C4359 Malignant melanoma of other part of trunk: Secondary | ICD-10-CM | POA: Diagnosis not present

## 2022-01-16 DIAGNOSIS — M9903 Segmental and somatic dysfunction of lumbar region: Secondary | ICD-10-CM | POA: Diagnosis not present

## 2022-01-16 DIAGNOSIS — M62830 Muscle spasm of back: Secondary | ICD-10-CM | POA: Diagnosis not present

## 2022-01-16 DIAGNOSIS — M9901 Segmental and somatic dysfunction of cervical region: Secondary | ICD-10-CM | POA: Diagnosis not present

## 2022-01-16 DIAGNOSIS — M5033 Other cervical disc degeneration, cervicothoracic region: Secondary | ICD-10-CM | POA: Diagnosis not present

## 2022-01-21 ENCOUNTER — Inpatient Hospital Stay: Payer: Medicare HMO | Admitting: Internal Medicine

## 2022-01-21 ENCOUNTER — Encounter: Payer: Self-pay | Admitting: Internal Medicine

## 2022-01-21 ENCOUNTER — Inpatient Hospital Stay: Payer: Medicare HMO | Attending: Oncology

## 2022-01-21 ENCOUNTER — Inpatient Hospital Stay: Payer: Medicare HMO

## 2022-01-21 DIAGNOSIS — B029 Zoster without complications: Secondary | ICD-10-CM | POA: Insufficient documentation

## 2022-01-21 DIAGNOSIS — C773 Secondary and unspecified malignant neoplasm of axilla and upper limb lymph nodes: Secondary | ICD-10-CM | POA: Diagnosis not present

## 2022-01-21 DIAGNOSIS — C439 Malignant melanoma of skin, unspecified: Secondary | ICD-10-CM

## 2022-01-21 DIAGNOSIS — Z5112 Encounter for antineoplastic immunotherapy: Secondary | ICD-10-CM | POA: Insufficient documentation

## 2022-01-21 DIAGNOSIS — Z85820 Personal history of malignant melanoma of skin: Secondary | ICD-10-CM | POA: Diagnosis not present

## 2022-01-21 DIAGNOSIS — C779 Secondary and unspecified malignant neoplasm of lymph node, unspecified: Secondary | ICD-10-CM | POA: Diagnosis not present

## 2022-01-21 LAB — COMPREHENSIVE METABOLIC PANEL
ALT: 19 U/L (ref 0–44)
AST: 32 U/L (ref 15–41)
Albumin: 4 g/dL (ref 3.5–5.0)
Alkaline Phosphatase: 45 U/L (ref 38–126)
Anion gap: 7 (ref 5–15)
BUN: 17 mg/dL (ref 8–23)
CO2: 23 mmol/L (ref 22–32)
Calcium: 9.2 mg/dL (ref 8.9–10.3)
Chloride: 108 mmol/L (ref 98–111)
Creatinine, Ser: 1.16 mg/dL (ref 0.61–1.24)
GFR, Estimated: >60 mL/min (ref >60)
Glucose, Bld: 111 mg/dL — ABNORMAL HIGH (ref 70–99)
Potassium: 4 mmol/L (ref 3.5–5.1)
Sodium: 138 mmol/L (ref 135–145)
Total Bilirubin: 0.7 mg/dL (ref 0.3–1.2)
Total Protein: 6.4 g/dL — ABNORMAL LOW (ref 6.5–8.1)

## 2022-01-21 LAB — CBC WITH DIFFERENTIAL/PLATELET
Abs Immature Granulocytes: 0.02 10*3/uL (ref 0.00–0.07)
Basophils Absolute: 0.1 10*3/uL (ref 0.0–0.1)
Basophils Relative: 1 %
Eosinophils Absolute: 0.2 10*3/uL (ref 0.0–0.5)
Eosinophils Relative: 4 %
HCT: 43.4 % (ref 39.0–52.0)
Hemoglobin: 14.8 g/dL (ref 13.0–17.0)
Immature Granulocytes: 0 %
Lymphocytes Relative: 34 %
Lymphs Abs: 1.6 10*3/uL (ref 0.7–4.0)
MCH: 31.6 pg (ref 26.0–34.0)
MCHC: 34.1 g/dL (ref 30.0–36.0)
MCV: 92.5 fL (ref 80.0–100.0)
Monocytes Absolute: 0.3 10*3/uL (ref 0.1–1.0)
Monocytes Relative: 6 %
Neutro Abs: 2.7 10*3/uL (ref 1.7–7.7)
Neutrophils Relative %: 55 %
Platelets: 219 10*3/uL (ref 150–400)
RBC: 4.69 MIL/uL (ref 4.22–5.81)
RDW: 15 % (ref 11.5–15.5)
WBC: 4.9 10*3/uL (ref 4.0–10.5)
nRBC: 0 % (ref 0.0–0.2)

## 2022-01-21 MED ORDER — SODIUM CHLORIDE 0.9 % IV SOLN
Freq: Once | INTRAVENOUS | Status: AC
  Filled 2022-01-21: qty 250

## 2022-01-21 MED ORDER — SODIUM CHLORIDE 0.9 % IV SOLN
200.0000 mg | Freq: Once | INTRAVENOUS | Status: AC
  Administered 2022-01-21: 200 mg via INTRAVENOUS
  Filled 2022-01-21: qty 8

## 2022-01-21 NOTE — Assessment & Plan Note (Addendum)
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  S/p neoadjuvant Keytruda x4- -Status post axillary lymph node dissection-with residual micrometastatic disease.  Reviewed the note from Mil[facility] at [facility] role for radiation.  ? ?# proceed with with [place] #8  today of planned total 18. Labs today reviewed;  acceptable for treatment today. FEB TSH- WNL ? ?# Shingles- s/p valacyclovir improved.  ? ?# DISPOSITION: needs Monday/early AM schedule  ?# Nat Math today ?# follow up in 3 weeks- MD; ; labs- cbc/cmp;TSH Keytruda-- Dr.B ? ? ?

## 2022-01-21 NOTE — Patient Instructions (Signed)
[facility] [place]  Discharge Instructions: ?Thank you for choosing [facility] to provide your oncology and hematology care.  ?If you have a lab appointment with the [facility], please go directly to the [facility] and check in at the registration area. ? ?Wear comfortable clothing and clothing appropriate for easy access to any Portacath or PICC line.  ? ?We strive to give you quality time with your provider. You may need to reschedule your appointment if you arrive late (15 or more minutes).  Arriving late affects you and other patients whose appointments are after yours.  Also, if you miss three or more appointments without notifying the office, you may be dismissed from the clinic at the provider?s discretion.    ?  ?For prescription refill requests, have your pharmacy contact our office and allow 72 hours for refills to be completed.   ? ?Today you received the following chemotherapy and/or immunotherapy agents: Keytruda    ?  ?To help prevent nausea and vomiting after your treatment, we encourage you to take your nausea medication as directed. ? ?BELOW ARE SYMPTOMS THAT SHOULD BE REPORTED IMMEDIATELY: ?*FEVER GREATER THAN 100.4 F (38 ?C) OR HIGHER ?*CHILLS OR SWEATING ?*NAUSEA AND VOMITING THAT IS NOT CONTROLLED WITH YOUR NAUSEA MEDICATION ?*UNUSUAL SHORTNESS OF BREATH ?*UNUSUAL BRUISING OR BLEEDING ?*URINARY PROBLEMS (pain or burning when urinating, or frequent urination) ?*BOWEL PROBLEMS (unusual diarrhea, constipation, pain near the anus) ?TENDERNESS IN MOUTH AND THROAT WITH OR WITHOUT PRESENCE OF ULCERS (sore throat, sores in mouth, or a toothache) ?UNUSUAL RASH, SWELLING OR PAIN  ?UNUSUAL VAGINAL DISCHARGE OR ITCHING  ? ?Items with * indicate a potential emergency and should be followed up as soon as possible or go to the Emergency Department if any problems should occur. ? ?Please show the CHEMOTHERAPY ALERT CARD or IMMUNOTHERAPY ALERT CARD at check-in to  the Emergency Department and triage nurse. ? ?Should you have questions after your visit or need to cancel or reschedule your appointment, please contact [facility] [place] [facility] [place]  [phone] and follow the prompts.  Office hours are 8:00 a.m. to 4:30 p.m. Monday - Friday. Please note that voicemails left after 4:00 p.m. may not be returned until the following business day.  We are closed weekends and major holidays. You have access to a nurse at all times for urgent questions. Please call the main number to the clinic [phone] and follow the prompts. ? ?For any non-urgent questions, you may also contact your provider using MyChart. We now offer e-Visits for anyone 8 and older to request care online for non-urgent symptoms. For details visit [email or web address]. ?  ?Also download the MyChart app! Go to the app store, search "MyChart", open the app, select [place], and log in with your MyChart username and password. ? ?Due to Covid, a mask is required upon entering the hospital/clinic. If you do not have a mask, one will be given to you upon arrival. For doctor visits, patients may have 1 support person aged 40 or older with them. For treatment visits, patients cannot have anyone with them due to current Covid guidelines and our immunocompromised population. Pembrolizumab injection ?What is this medication? ?PEMBROLIZUMAB (pem broe liz ue mab) is a monoclonal antibody. It is used to treat certain types of cancer. ?This medicine may be used for other purposes; ask your health care provider or pharmacist if you have questions. ?COMMON BRAND NAME(S): Keytruda ?What should I tell my care team before I  take this medication? ?They need to know if you have any of these conditions: ?autoimmune diseases like Crohn's disease, ulcerative colitis, or lupus ?have had or planning to have an allogeneic stem cell transplant (uses someone else's stem cells) ?history of organ transplant ?history of  chest radiation ?nervous system problems like myasthenia gravis or Guillain-Barre syndrome ?an unusual or allergic reaction to pembrolizumab, other medicines, foods, dyes, or preservatives ?pregnant or trying to get pregnant ?breast-feeding ?How should I use this medication? ?This medicine is for infusion into a vein. It is given by a health care professional in a hospital or clinic setting. ?A special MedGuide will be given to you before each treatment. Be sure to read this information carefully each time. ?Talk to your pediatrician regarding the use of this medicine in children. While this drug may be prescribed for children as young as 6 months for selected conditions, precautions do apply. ?Overdosage: If you think you have taken too much of this medicine contact a poison control center or emergency room at once. ?NOTE: This medicine is only for you. Do not share this medicine with others. ?What if I miss a dose? ?It is important not to miss your dose. Call your doctor or health care professional if you are unable to keep an appointment. ?What may interact with this medication? ?Interactions have not been studied. ?This list may not describe all possible interactions. Give your health care provider a list of all the medicines, herbs, non-prescription drugs, or dietary supplements you use. Also tell them if you smoke, drink alcohol, or use illegal drugs. Some items may interact with your medicine. ?What should I watch for while using this medication? ?Your condition will be monitored carefully while you are receiving this medicine. ?You may need blood work done while you are taking this medicine. ?Do not become pregnant while taking this medicine or for 4 months after stopping it. Women should inform their doctor if they wish to become pregnant or think they might be pregnant. There is a potential for serious side effects to an unborn child. Talk to your health care professional or pharmacist for more  information. Do not breast-feed an infant while taking this medicine or for 4 months after the last dose. ?What side effects may I notice from receiving this medication? ?Side effects that you should report to your doctor or health care professional as soon as possible: ?allergic reactions like skin rash, itching or hives, swelling of the face, lips, or tongue ?bloody or black, tarry ?breathing problems ?changes in vision ?chest pain ?chills ?confusion ?constipation ?cough ?diarrhea ?dizziness or feeling faint or lightheaded ?fast or irregular heartbeat ?fever ?flushing ?joint pain ?low blood counts - this medicine may decrease the number of white blood cells, red blood cells and platelets. You may be at increased risk for infections and bleeding. ?muscle pain ?muscle weakness ?pain, tingling, numbness in the hands or feet ?persistent headache ?redness, blistering, peeling or loosening of the skin, including inside the mouth ?signs and symptoms of high blood sugar such as dizziness; dry mouth; dry skin; fruity breath; nausea; stomach pain; increased hunger or thirst; increased urination ?signs and symptoms of kidney injury like trouble passing urine or change in the amount of urine ?signs and symptoms of liver injury like dark urine, light-colored stools, loss of appetite, nausea, right upper belly pain, yellowing of the eyes or skin ?sweating ?swollen lymph nodes ?weight loss ?Side effects that usually do not require medical attention (report to your doctor or health care  professional if they continue or are bothersome): ?decreased appetite ?hair loss ?tiredness ?This list may not describe all possible side effects. Call your doctor for medical advice about side effects. You may report side effects to FDA at 1-800-FDA-1088. ?Where should I keep my medication? ?This drug is given in a hospital or clinic and will not be stored at home. ?NOTE: This sheet is a summary. It may not cover all possible information. If you  have questions about this medicine, talk to your doctor, pharmacist, or health care provider. ?? 2023 Elsevier/Gold Standard (2021-08-24 00:00:00) ? ?

## 2022-01-21 NOTE — Progress Notes (Signed)
I connected with [name] on 01/21/22 at  9:00 AM EDT by video enabled telemedicine visit and verified that I am speaking with the correct person using two identifiers.  ?I discussed the limitations, risks, security and privacy concerns of performing an evaluation and management service by telemedicine and the availability of in-person appointments. I also discussed with the patient that there may be a patient responsible charge related to this service. The patient expressed understanding and agreed to proceed.  ? ? ?Other persons participating in the visit and their role in the encounter: RN/medical reconciliation ?Patient?s location: office ?Provider?s location: home ? ?Oncology History Overview Note  ?#  2015- left collar bone [Dr.Kowalski]- s/p resection- 0.65 mm ? ?#  ?A. 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, A1, [name], [name], focal intranuclear inclusions,  [name], [name], 26th, [name],  majority  of these foci measure 1-2 mm in greatest dimension, with the largest measuring 4 mm.  Continue adjuvant Keytruda for total of 1 year ? ? ? ? ?  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  ?08/13/2021 Cancer Staging  ? Staging form: Melanoma of the Skin, [name], [name], [name], MD on 08/13/2021 ? ?  ?08/13/2021 -  Chemotherapy  ? Patient is on Treatment Plan : MELANOMA Pembrolizumab q21d  ? ?  ?  ? ?Chief Complaint: melanoma   ? ?History of present illness:[name] 66 y.o.  male with history of melanoma recurrent stage III-currently on adjuvant pembrolizumab is here for follow-up. ? ?Patient in the interim was evaluated at [facility] to have a discussion regarding role of radiation in the axilla. ? ?Otherwise denies any nausea vomiting denies any abdominal pain denies any chest pain or shortness of breath or cough. ? ?Observation/objective: Alert & oriented x 3. In No acute distress.  ? 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  S/p neoadjuvant Keytruda x4- -Status post axillary lymph node dissection-with residual micrometastatic disease.  Reviewed the note from [place] at [facility] role for radiation.  ? ?# proceed with with [place] #8  today of planned total 18. Labs today reviewed;  acceptable for treatment today. FEB TSH- WNL ? ?# Shingles- s/p valacyclovir improved.  ? ?# DISPOSITION: needs Monday/early AM schedule  ?# Nat Math today ?# follow up in 3 weeks- MD; ; labs- cbc/cmp;TSH Keytruda-- Dr.B ? ? ? ? ?Follow-up instructions: ? ?I discussed the assessment and treatment plan with  the patient.  The patient was provided an opportunity to ask questions and all were answered.  The patient agreed with the plan and demonstrated understanding of instructions. ? ?The  patient was advised to call back or seek an in person evaluation if the symptoms worsen or if the condition fails to improve as anticipated. ? ? ? ?Dr. Charlaine Dalton ?[facility] at [facility] ?01/21/2022 ?9:44 AM ?

## 2022-01-22 DIAGNOSIS — C439 Malignant melanoma of skin, unspecified: Secondary | ICD-10-CM | POA: Diagnosis not present

## 2022-01-22 DIAGNOSIS — C773 Secondary and unspecified malignant neoplasm of axilla and upper limb lymph nodes: Secondary | ICD-10-CM | POA: Diagnosis not present

## 2022-02-11 ENCOUNTER — Inpatient Hospital Stay: Payer: Medicare HMO | Attending: Internal Medicine

## 2022-02-11 ENCOUNTER — Inpatient Hospital Stay: Payer: Medicare HMO

## 2022-02-11 ENCOUNTER — Encounter: Payer: Self-pay | Admitting: Internal Medicine

## 2022-02-11 ENCOUNTER — Inpatient Hospital Stay: Payer: Medicare HMO | Admitting: Internal Medicine

## 2022-02-11 DIAGNOSIS — R202 Paresthesia of skin: Secondary | ICD-10-CM | POA: Insufficient documentation

## 2022-02-11 DIAGNOSIS — B029 Zoster without complications: Secondary | ICD-10-CM | POA: Insufficient documentation

## 2022-02-11 DIAGNOSIS — Z79890 Hormone replacement therapy (postmenopausal): Secondary | ICD-10-CM | POA: Diagnosis not present

## 2022-02-11 DIAGNOSIS — Z801 Family history of malignant neoplasm of trachea, bronchus and lung: Secondary | ICD-10-CM | POA: Insufficient documentation

## 2022-02-11 DIAGNOSIS — C439 Malignant melanoma of skin, unspecified: Secondary | ICD-10-CM

## 2022-02-11 DIAGNOSIS — C779 Secondary and unspecified malignant neoplasm of lymph node, unspecified: Secondary | ICD-10-CM | POA: Diagnosis not present

## 2022-02-11 DIAGNOSIS — Z5112 Encounter for antineoplastic immunotherapy: Secondary | ICD-10-CM | POA: Insufficient documentation

## 2022-02-11 DIAGNOSIS — C773 Secondary and unspecified malignant neoplasm of axilla and upper limb lymph nodes: Secondary | ICD-10-CM | POA: Diagnosis not present

## 2022-02-11 DIAGNOSIS — R5383 Other fatigue: Secondary | ICD-10-CM | POA: Diagnosis not present

## 2022-02-11 DIAGNOSIS — R200 Anesthesia of skin: Secondary | ICD-10-CM | POA: Diagnosis not present

## 2022-02-11 DIAGNOSIS — Z79899 Other long term (current) drug therapy: Secondary | ICD-10-CM | POA: Insufficient documentation

## 2022-02-11 DIAGNOSIS — Z0100 Encounter for examination of eyes and vision without abnormal findings: Secondary | ICD-10-CM | POA: Diagnosis not present

## 2022-02-11 DIAGNOSIS — E039 Hypothyroidism, unspecified: Secondary | ICD-10-CM | POA: Insufficient documentation

## 2022-02-11 LAB — COMPREHENSIVE METABOLIC PANEL
ALT: 22 U/L (ref 0–44)
AST: 38 U/L (ref 15–41)
Albumin: 4.1 g/dL (ref 3.5–5.0)
Alkaline Phosphatase: 44 U/L (ref 38–126)
Anion gap: 9 (ref 5–15)
BUN: 20 mg/dL (ref 8–23)
CO2: 23 mmol/L (ref 22–32)
Calcium: 9.1 mg/dL (ref 8.9–10.3)
Chloride: 107 mmol/L (ref 98–111)
Creatinine, Ser: 1.14 mg/dL (ref 0.61–1.24)
GFR, Estimated: >60 mL/min (ref >60)
Glucose, Bld: 99 mg/dL (ref 70–99)
Potassium: 3.7 mmol/L (ref 3.5–5.1)
Sodium: 139 mmol/L (ref 135–145)
Total Bilirubin: 0.6 mg/dL (ref 0.3–1.2)
Total Protein: 6.4 g/dL — ABNORMAL LOW (ref 6.5–8.1)

## 2022-02-11 LAB — CBC WITH DIFFERENTIAL/PLATELET
Abs Immature Granulocytes: 0.03 10*3/uL (ref 0.00–0.07)
Basophils Absolute: 0.1 10*3/uL (ref 0.0–0.1)
Basophils Relative: 1 %
Eosinophils Absolute: 0.2 10*3/uL (ref 0.0–0.5)
Eosinophils Relative: 5 %
HCT: 42.2 % (ref 39.0–52.0)
Hemoglobin: 14.4 g/dL (ref 13.0–17.0)
Immature Granulocytes: 1 %
Lymphocytes Relative: 36 %
Lymphs Abs: 1.7 10*3/uL (ref 0.7–4.0)
MCH: 31.6 pg (ref 26.0–34.0)
MCHC: 34.1 g/dL (ref 30.0–36.0)
MCV: 92.5 fL (ref 80.0–100.0)
Monocytes Absolute: 0.4 10*3/uL (ref 0.1–1.0)
Monocytes Relative: 9 %
Neutro Abs: 2.3 10*3/uL (ref 1.7–7.7)
Neutrophils Relative %: 48 %
Platelets: 198 10*3/uL (ref 150–400)
RBC: 4.56 MIL/uL (ref 4.22–5.81)
RDW: 14.7 % (ref 11.5–15.5)
WBC: 4.8 10*3/uL (ref 4.0–10.5)
nRBC: 0 % (ref 0.0–0.2)

## 2022-02-11 LAB — TSH: TSH: 50 u[IU]/mL — ABNORMAL HIGH (ref 0.350–4.500)

## 2022-02-11 MED ORDER — SODIUM CHLORIDE 0.9 % IV SOLN
200.0000 mg | Freq: Once | INTRAVENOUS | Status: AC
  Administered 2022-02-11: 200 mg via INTRAVENOUS
  Filled 2022-02-11: qty 200

## 2022-02-11 MED ORDER — SODIUM CHLORIDE 0.9 % IV SOLN
Freq: Once | INTRAVENOUS | Status: AC
  Filled 2022-02-11: qty 250

## 2022-02-11 NOTE — Progress Notes (Signed)
Patient denies new problems/concerns today.   °

## 2022-02-11 NOTE — Telephone Encounter (Signed)
Please schedule the patient for labs tomorrow-thyroid panel ordered. Thanks ?GB ?

## 2022-02-11 NOTE — Progress Notes (Signed)
[name] ?OFFICE PROGRESS NOTE ? ?Patient Care Team: ?[name]., MD as PCP - General (Family Medicine) ?Tyler Sickle, 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 ? ?Oncology History Overview Note  ?#  2015- left collar bone [Dr.Kowalski]- s/p resection- 0.65 mm ? ?#  ?A. 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, A1, [name], [name], focal intranuclear inclusions,  [name], [name], 26th, [name],  majority of these foci measure 1-2 mm in greatest dimension, with the largest measuring 4 mm.  Continue adjuvant Keytruda for total of 1 year ? ? ? ? ?  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  ?08/13/2021 Cancer Staging  ? Staging form: Melanoma of the Skin, [name], [name], cM0) - Signed by Tyler Sickle, MD on 08/13/2021 ? ?  ?08/13/2021 -  Chemotherapy  ? Patient is on Treatment Plan : MELANOMA Pembrolizumab q21d  ? ?  ?  ? ? ?HPI: Ambulating independently.  Accompanied by his wife. ? ?[name] 66 y.o.  male pleasant patient above history of recurrent melanoma left axillary lymphadenopathy-currently on adjuvant [name] is here for follow-up. ? ?Patient complains of mild numbness around the site of the surgery.  Otherwise denies any nausea vomiting diarrhea. ? ?No shortness of breath or cough. ? ?Appetite is good.  No weight loss no nausea vomiting. ? ?Review of Systems  ?Constitutional:  Negative for chills, diaphoresis, fever, [name], sputum production, [name], palpitations, orthopnea and leg swelling.  ?Gastrointestinal:  Negative for abdominal pain, blood in stool, constipation, diarrhea, heartburn, melena, nausea and vomiting.  ?Genitourinary:  Negative for dysuria, frequency and urgency.  ?Musculoskeletal:  Negative for back pain and joint pain.  ?Skin: Negative.  Negative for itching and rash.  ?Neurological:  Negative for dizziness, tingling, focal weakness and weakness.  ?Endo/Heme/Allergies:  Does not bruise/bleed easily.  ?Psychiatric/Behavioral:  Negative for depression. The patient is not nervous/anxious and does not have insomnia.   ?  ? ?PAST MEDICAL HISTORY :  ?Past Medical History:  ?Diagnosis Date  ? Basal cell carcinoma 03/10/2013  ?  Right medial forehead. Excised, margins free.  ? Dysplastic nevus 11/05/2006  ? Mid back paraspinal, [name], close to edge. Excised  12/24/2006, margins free.  ? Dysplastic nevus 12/15/2008  ? Left lat. pectoral area. Moderate atypia, extends to one edge.   ? Dysplastic nevus 12/07/2009  ? Left mid side. Moderate atypia, close to margin.  ? Dysplastic nevus 12/07/2009  ? Right posterior waistline. Moderate atypia, close to margin.  ? Dysplastic nevus 02/10/2014  ? Left paraspinal mid back. Moderate atypia, lateral and deep margin involved.   ? Dysplastic nevus 06/16/2014  ? Left epigastric. Mild atypia, deep margin involved.   ? GERD (gastroesophageal reflux disease)   ? Hx of basal cell carcinoma   ? multiple sites  ? Hx of malignant melanoma 11/11/2013  ? L lateral infraclavicular, superficial spreading, [name], Clark's level III  ? Hypothyroidism   ? [place]  ? Left lateral infraclavicular. MM, superficial spreading. Anatomic level III. Tumor thickness 0.641m Excised 11/24/2013, margins free.   ? ? ?PAST SURGICAL HISTORY :   ?Past Surgical History:  ?Procedure Laterality Date  ? AXILLARY LYMPH NODE DISSECTION Left 11/02/2021  ? Procedure: AXILLARY LYMPH NODE DISSECTION;  [name];  [name];  Service: General;  Laterality: Left;  ? COLONOSCOPY WITH PROPOFOL N/A 01/20/2017  ? Procedure: COLONOSCOPY WITH PROPOFOL;  [name];  [name];  Service: Endoscopy;  [name];  ? ESOPHAGOGASTRODUODENOSCOPY (EGD) WITH ESOPHAGEAL DILATION    ? MELANOMA EXCISION Right 11/24/2013  ? lateral infraclavicular  ? WISDOM TOOTH EXTRACTION    ? WRIST SURGERY Left 2022  ? ? ?FAMILY HISTORY :   ?Family History  ?Problem Relation Age of Onset  ? Macular degeneration Mother   ? Hyperlipidemia Mother   ? Heart disease Father   ? Heart attack Father   ? Lung cancer Father   ?     metastasized  ? ? ?SOCIAL HISTORY:   ?Social History  ? ?Tobacco Use  ? Smoking status: Never  ? Smokeless tobacco: Never  ?Vaping Use  ? Vaping Use: Never used  ?Substance Use Topics  ?  Alcohol use: Yes  ?  Comment: about 1-3 glass of wine with dinner  ? Drug use: No  ? ? ?ALLERGIES:  has No Known Allergies. ? ?MEDICATIONS:  ?Current Outpatient Medications  ?Medication Sig Dispense Refill  ? aspirin 81 MG tablet Take 81 mg by mouth daily.    ? levothyroxine (SYNTHROID) 50 MCG tablet TAKE 1 TABLET BY MOUTH EVERY DAY 90 tablet 1  ? Multiple Vitamins tablet Take 1 tablet by mouth daily.    ? Multiple Vitamins-Minerals (PRESERVISION AREDS 2 PO) Take 1 capsule by mouth in the morning and at bedtime.    ? pantoprazole (PROTONIX) 20 MG tablet Take 20 mg by mouth daily.  0  ? Pembrolizumab (KEYTRUDA IV) Inject 1 Dose into the vein every 21 ( twenty-one) days.    ? fluorouracil (EFUDEX) 5 % cream Apply to scalp twice a day for 7 days, (Patient not taking: Reported on 12/31/2021) 40 g 1  ? ?No current facility-administered medications for this visit.  ? ? ?PHYSICAL EXAMINATION: ?ECOG PERFORMANCE STATUS: 0 - Asymptomatic ? ?BP (!) 141/87   Pulse 65   Temp (!) 96.5 ?F (35.8 ?C)   Resp 18   Wt 202 lb (91.6 kg)   BMI 26.65 kg/m?  ? ?Filed Weights  ? 02/11/22 0800  ?  Weight: 202 lb (91.6 kg)  ? ?Positive for lymph node left underarm approximate 2 to 3 cm in size. ? ?Physical Exam ?Vitals and nursing note reviewed.  ?HENT:  ?   Head: Normocephalic and atraumatic.  ?   Mouth/Throat:  ?   Pharynx: Oropharynx is clear.  ?Eyes:  ?   Extraocular Movements: Extraocular movements intact.  ?   Pupils: Pupils are equal, round, and reactive to light.  ?Cardiovascular:  ?   Rate and Rhythm: Normal rate and regular rhythm.  ?Pulmonary:  ?   Comments: Decreased breath sounds bilaterally.  ?Abdominal:  ?   Palpations: Abdomen is soft.  ?Musculoskeletal:     ?   General: Normal range of motion.  ?   Cervical back: Normal range of motion.  ?Skin: ?   General: Skin is warm.  ?Neurological:  ?   General: No focal deficit present.  ?   Mental Status: He is alert and oriented to person, place, and time.  ?Psychiatric:     ?    Behavior: Behavior normal.     ?   Judgment: Judgment normal.  ? ? ? ? ? ?LABORATORY DATA:  ?I have reviewed the data as listed ?   ?Component Value Date/Time  ? NA 139 02/11/2022 0830  ? NA 145 (H) 06/26/2021 1416  ? K 3.

## 2022-02-11 NOTE — Assessment & Plan Note (Signed)
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  S/p neoadjuvant Keytruda x4- -Status post axillary lymph node dissection-with residual micrometastatic disease.  Reviewed the note from [place] at [facility] role for radiation.  ? ?# proceed with with [place] #9  today of planned total 18. Labs today reviewed;  acceptable for treatment today. 10th- FEB TSH- WNL ? ?# Shingles- s/p valacyclovir improved.  ? ?# DISPOSITION: needs Monday/early AM schedule  ?# Nat Math today ?# follow up in 3 weeks- MD; ; labs- cbc/cmp;Keytruda-- Dr.B ? ? ?

## 2022-02-11 NOTE — Patient Instructions (Signed)
[facility]  Discharge Instructions: °Thank you for choosing [facility] to provide your oncology and hematology care.  °If you have a lab appointment with the Cancer Center, please go directly to the Cancer Center and check in at the registration area. ° °Wear comfortable clothing and clothing appropriate for easy access to any Portacath or PICC line.  ° °We strive to give you quality time with your provider. You may need to reschedule your appointment if you arrive late (15 or more minutes).  Arriving late affects you and other patients whose appointments are after yours.  Also, if you miss three or more appointments without notifying the office, you may be dismissed from the clinic at the provider’s discretion.    °  °For prescription refill requests, have your pharmacy contact our office and allow 72 hours for refills to be completed.   ° °Today you received the following chemotherapy and/or immunotherapy agents Keytruda °    °  °To help prevent nausea and vomiting after your treatment, we encourage you to take your nausea medication as directed. ° °BELOW ARE SYMPTOMS THAT SHOULD BE REPORTED IMMEDIATELY: °*FEVER GREATER THAN 100.4 F (38 °C) OR HIGHER °*CHILLS OR SWEATING °*NAUSEA AND VOMITING THAT IS NOT CONTROLLED WITH YOUR NAUSEA MEDICATION °*UNUSUAL SHORTNESS OF BREATH °*UNUSUAL BRUISING OR BLEEDING °*URINARY PROBLEMS (pain or burning when urinating, or frequent urination) °*BOWEL PROBLEMS (unusual diarrhea, constipation, pain near the anus) °TENDERNESS IN MOUTH AND THROAT WITH OR WITHOUT PRESENCE OF ULCERS (sore throat, sores in mouth, or a toothache) °UNUSUAL RASH, SWELLING OR PAIN  °UNUSUAL VAGINAL DISCHARGE OR ITCHING  ° °Items with * indicate a potential emergency and should be followed up as soon as possible or go to the Emergency Department if any problems should occur. ° °Please show the CHEMOTHERAPY ALERT CARD or IMMUNOTHERAPY ALERT CARD at check-in to  the Emergency Department and triage nurse. ° °Should you have questions after your visit or need to cancel or reschedule your appointment, please contact [facility]  [phone] and follow the prompts.  Office hours are 8:00 a.m. to 4:30 p.m. Monday - Friday. Please note that voicemails left after 4:00 p.m. may not be returned until the following business day.  We are closed weekends and major holidays. You have access to a nurse at all times for urgent questions. Please call the main number to the clinic [phone] and follow the prompts. ° °For any non-urgent questions, you may also contact your provider using MyChart. We now offer e-Visits for anyone 18 and older to request care online for non-urgent symptoms. For details visit [email or web address]. °  °Also download the MyChart app! Go to the app store, search "MyChart", open the app, select [facility], and log in with your MyChart username and password. ° °Due to Covid, a mask is required upon entering the hospital/clinic. If you do not have a mask, one will be given to you upon arrival. For doctor visits, patients may have 1 support person aged 18 or older with them. For treatment visits, patients cannot have anyone with them due to current Covid guidelines and our immunocompromised population.  °

## 2022-02-12 ENCOUNTER — Inpatient Hospital Stay: Payer: Medicare HMO

## 2022-02-12 DIAGNOSIS — B029 Zoster without complications: Secondary | ICD-10-CM | POA: Diagnosis not present

## 2022-02-12 DIAGNOSIS — C439 Malignant melanoma of skin, unspecified: Secondary | ICD-10-CM

## 2022-02-12 DIAGNOSIS — R200 Anesthesia of skin: Secondary | ICD-10-CM | POA: Diagnosis not present

## 2022-02-12 DIAGNOSIS — R5383 Other fatigue: Secondary | ICD-10-CM | POA: Diagnosis not present

## 2022-02-12 DIAGNOSIS — Z5112 Encounter for antineoplastic immunotherapy: Secondary | ICD-10-CM | POA: Diagnosis not present

## 2022-02-12 DIAGNOSIS — Z79899 Other long term (current) drug therapy: Secondary | ICD-10-CM | POA: Diagnosis not present

## 2022-02-12 DIAGNOSIS — C773 Secondary and unspecified malignant neoplasm of axilla and upper limb lymph nodes: Secondary | ICD-10-CM | POA: Diagnosis not present

## 2022-02-12 DIAGNOSIS — E039 Hypothyroidism, unspecified: Secondary | ICD-10-CM | POA: Diagnosis not present

## 2022-02-12 DIAGNOSIS — Z801 Family history of malignant neoplasm of trachea, bronchus and lung: Secondary | ICD-10-CM | POA: Diagnosis not present

## 2022-02-12 DIAGNOSIS — Z79890 Hormone replacement therapy (postmenopausal): Secondary | ICD-10-CM | POA: Diagnosis not present

## 2022-02-12 DIAGNOSIS — R202 Paresthesia of skin: Secondary | ICD-10-CM | POA: Diagnosis not present

## 2022-02-13 ENCOUNTER — Telehealth: Payer: Self-pay | Admitting: Internal Medicine

## 2022-02-13 DIAGNOSIS — Z961 Presence of intraocular lens: Secondary | ICD-10-CM | POA: Diagnosis not present

## 2022-02-13 DIAGNOSIS — M5033 Other cervical disc degeneration, cervicothoracic region: Secondary | ICD-10-CM | POA: Diagnosis not present

## 2022-02-13 DIAGNOSIS — M9901 Segmental and somatic dysfunction of cervical region: Secondary | ICD-10-CM | POA: Diagnosis not present

## 2022-02-13 DIAGNOSIS — H52223 Regular astigmatism, bilateral: Secondary | ICD-10-CM | POA: Diagnosis not present

## 2022-02-13 DIAGNOSIS — M9903 Segmental and somatic dysfunction of lumbar region: Secondary | ICD-10-CM | POA: Diagnosis not present

## 2022-02-13 DIAGNOSIS — H524 Presbyopia: Secondary | ICD-10-CM | POA: Diagnosis not present

## 2022-02-13 DIAGNOSIS — M62830 Muscle spasm of back: Secondary | ICD-10-CM | POA: Diagnosis not present

## 2022-02-13 LAB — THYROID PANEL WITH TSH
Free Thyroxine Index: 1 — ABNORMAL LOW (ref 1.2–4.9)
T3 Uptake Ratio: 25 % (ref 24–39)
T4, Total: 4.1 ug/dL — ABNORMAL LOW (ref 4.5–12.0)
TSH: 44.2 u[IU]/mL — ABNORMAL HIGH (ref 0.450–4.500)

## 2022-02-13 NOTE — Telephone Encounter (Signed)
Spoke to patient regarding results of the thyroid-TSH 44.  Patient currently on Synthroid 50 mcg.  Left a message for Dr. Rosanna Randy regarding adjusting patient's Synthroid levels.  ?

## 2022-02-14 ENCOUNTER — Other Ambulatory Visit: Payer: Self-pay | Admitting: Internal Medicine

## 2022-02-14 ENCOUNTER — Encounter: Payer: Self-pay | Admitting: Internal Medicine

## 2022-02-14 ENCOUNTER — Other Ambulatory Visit: Payer: Self-pay

## 2022-02-14 MED ORDER — LEVOTHYROXINE SODIUM 100 MCG PO TABS: 100.0000 ug | ORAL_TABLET | Freq: Every day | ORAL | 3 refills | Status: DC

## 2022-02-14 NOTE — Progress Notes (Signed)
Please order TSH  at next visit-Thanks ?GB ?

## 2022-02-15 ENCOUNTER — Other Ambulatory Visit: Payer: Self-pay

## 2022-02-15 DIAGNOSIS — C439 Malignant melanoma of skin, unspecified: Secondary | ICD-10-CM

## 2022-02-15 NOTE — Progress Notes (Signed)
Lab entered

## 2022-02-20 ENCOUNTER — Ambulatory Visit (INDEPENDENT_AMBULATORY_CARE_PROVIDER_SITE_OTHER): Payer: Medicare HMO | Admitting: Dermatology

## 2022-02-20 DIAGNOSIS — D1800 Hemangioma unspecified site: Secondary | ICD-10-CM | POA: Diagnosis not present

## 2022-02-20 DIAGNOSIS — Z1283 Encounter for screening for malignant neoplasm of skin: Secondary | ICD-10-CM | POA: Diagnosis not present

## 2022-02-20 DIAGNOSIS — L820 Inflamed seborrheic keratosis: Secondary | ICD-10-CM

## 2022-02-20 DIAGNOSIS — C4359 Malignant melanoma of other part of trunk: Secondary | ICD-10-CM

## 2022-02-20 DIAGNOSIS — L578 Other skin changes due to chronic exposure to nonionizing radiation: Secondary | ICD-10-CM

## 2022-02-20 DIAGNOSIS — W57XXXA Bitten or stung by nonvenomous insect and other nonvenomous arthropods, initial encounter: Secondary | ICD-10-CM | POA: Diagnosis not present

## 2022-02-20 DIAGNOSIS — Z85820 Personal history of malignant melanoma of skin: Secondary | ICD-10-CM

## 2022-02-20 DIAGNOSIS — L570 Actinic keratosis: Secondary | ICD-10-CM | POA: Diagnosis not present

## 2022-02-20 DIAGNOSIS — C439 Malignant melanoma of skin, unspecified: Secondary | ICD-10-CM

## 2022-02-20 DIAGNOSIS — L821 Other seborrheic keratosis: Secondary | ICD-10-CM | POA: Diagnosis not present

## 2022-02-20 DIAGNOSIS — L814 Other melanin hyperpigmentation: Secondary | ICD-10-CM | POA: Diagnosis not present

## 2022-02-20 DIAGNOSIS — D229 Melanocytic nevi, unspecified: Secondary | ICD-10-CM | POA: Diagnosis not present

## 2022-02-20 DIAGNOSIS — S80862A Insect bite (nonvenomous), left lower leg, initial encounter: Secondary | ICD-10-CM | POA: Diagnosis not present

## 2022-02-20 DIAGNOSIS — S80861A Insect bite (nonvenomous), right lower leg, initial encounter: Secondary | ICD-10-CM | POA: Diagnosis not present

## 2022-02-20 NOTE — Progress Notes (Signed)
Follow-Up Visit   Subjective  [name] is a 66 y.o. male who presents for the following: Annual Exam (The patient presents for Total-Body Skin Exam (TBSE) for skin cancer screening and mole check.  The patient has spots, moles and lesions to be evaluated, some may be new or changing and the patient has concerns that these could be cancer. ). 3 months mole check pt with metastatic melanoma.  The following portions of the chart were reviewed this encounter and updated as appropriate:   Tobacco  Allergies  Meds  Problems  Med Hx  Surg Hx  Fam Hx     Review of Systems:  No other skin or systemic complaints except as noted in HPI or Assessment and Plan.  Objective  Well appearing patient in no apparent distress; mood and affect are within normal limits.  A full examination was performed including scalp, head, eyes, ears, nose, lips, neck, chest, axillae, abdomen, back, buttocks, bilateral upper extremities, bilateral lower extremities, hands, feet, fingers, toes, fingernails, and toenails. All findings within normal limits unless otherwise noted below.  right temple x 1, left axilla x 2 (2) Stuck-on, waxy, tan-brown papules -- Discussed benign etiology and prognosis.   left cheek Erythematous thin papules/macules with gritty scale.   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  Keytruda and may have Radiation. Overall doing and feeling well.   PET scan does not show metastases anywhere other than left axillary lymph nodes. MRI of brain is clear.  CT of Chest; abdomen and pelvis is clear except for Left axilla lymphadenopathy. He is being treated by Dr. Burlene Arnt with [name]   Inflamed seborrheic keratosis (2) right temple x 1, left axilla x 2  Reassured benign age-related growth.  Recommend observation.  Discussed cryotherapy if spot(s) become irritated  or inflamed.   Destruction of lesion - right temple x 1, left axilla x 2 Complexity: simple   Destruction method: cryotherapy   Informed consent: discussed and consent obtained   Timeout:  patient name, date of birth, surgical site, and procedure verified Lesion destroyed using liquid nitrogen: Yes   Region frozen until ice ball extended beyond lesion: Yes   Outcome: patient tolerated procedure well with no complications   Post-procedure details: wound care instructions given    AK (actinic keratosis) left cheek  Actinic keratoses are precancerous spots that appear secondary to cumulative UV radiation exposure/sun exposure over time. They are chronic with expected duration over 1 year. A portion of actinic keratoses will progress to squamous cell carcinoma of the skin. It is not possible to reliably predict which spots will progress to skin cancer and so treatment is recommended to prevent development of skin cancer.  Recommend daily broad spectrum sunscreen SPF 30+ to sun-exposed areas, reapply every 2 hours as needed.  Recommend staying in the shade or wearing long sleeves, sun glasses (UVA+UVB protection) and wide brim hats (4-inch brim around the entire circumference of the hat). Call for new or changing lesions.   For for scalp start 5FU/calcipotriene cream apply to scalp bid x 7 days, pt will get approval to start this cream from his Oncologist   Destruction of lesion - left cheek Complexity: simple   Destruction method: cryotherapy   Informed consent: discussed and consent obtained   Timeout:  patient name, date of birth, surgical site, and procedure verified Lesion destroyed using liquid  nitrogen: Yes   Region frozen until ice ball extended beyond lesion: Yes   Outcome: patient tolerated procedure well with no complications   Post-procedure details: wound care instructions given    Related Medications fluorouracil (EFUDEX) 5 % cream Apply to scalp twice a day for 7  days,  Bug bite without infection, initial encounter legs  Lentigines - Scattered tan macules - Due to sun exposure - Benign-appearing, observe - Recommend daily broad spectrum sunscreen SPF 30+ to sun-exposed areas, reapply every 2 hours as needed. - Call for any changes  Seborrheic Keratoses - Stuck-on, waxy, tan-brown papules and/or plaques  - Benign-appearing - Discussed benign etiology and prognosis. - Observe - Call for any changes  Melanocytic Nevi - Tan-brown and/or pink-flesh-colored symmetric macules and papules - Benign appearing on exam today - Observation - Call clinic for new or changing moles - Recommend daily use of broad spectrum spf 30+ sunscreen to sun-exposed areas.   Hemangiomas - Red papules - Discussed benign nature - Observe - Call for any changes  Actinic Damage - Chronic condition, secondary to cumulative UV/sun exposure - diffuse scaly erythematous macules with underlying dyspigmentation - Recommend daily broad spectrum sunscreen SPF 30+ to sun-exposed areas, reapply every 2 hours as needed.  - Staying in the shade or wearing long sleeves, sun glasses (UVA+UVB protection) and wide brim hats (4-inch brim around the entire circumference of the hat) are also recommended for sun protection.  - Call for new or changing lesions.  History of Melanoma - Left lateral infraclavicular in February 2015; S/P Wide Local excision - clear with close follow up for past 7 years.  Patient met all recommended treatment protocol and follow up. Recent discovery of symptomatic lump in Left axilla showing Melanoma by biopsy.  Patient has had surgery and now is on Keytruda chemotherapy for his metastatic melanoma.  He is doing generally well - No evidence of recurrence today - No lymphadenopathy - Recommend regular full body skin exams - Recommend daily broad spectrum sunscreen SPF 30+ to sun-exposed areas, reapply every 2 hours as needed.  - Call if any new or changing  lesions are noted between office visits    Skin cancer screening performed today.   Return in about 3 months (around 05/23/2022), or TBSE, Metastatic melanoma.  IMarye Round, CMA, am acting as scribe for Sarina Ser, MD .  Documentation: I have reviewed the above documentation for accuracy and completeness, and I agree with the above.  Sarina Ser, MD

## 2022-02-20 NOTE — Patient Instructions (Addendum)

## 2022-02-24 ENCOUNTER — Other Ambulatory Visit: Payer: Self-pay | Admitting: Family Medicine

## 2022-03-02 ENCOUNTER — Encounter: Payer: Self-pay | Admitting: Dermatology

## 2022-03-05 ENCOUNTER — Encounter: Payer: Self-pay | Admitting: Internal Medicine

## 2022-03-05 ENCOUNTER — Inpatient Hospital Stay: Payer: Medicare HMO

## 2022-03-05 ENCOUNTER — Inpatient Hospital Stay (HOSPITAL_BASED_OUTPATIENT_CLINIC_OR_DEPARTMENT_OTHER): Payer: Medicare HMO | Admitting: Internal Medicine

## 2022-03-05 VITALS — BP 131/89 | HR 74 | Temp 97.7°F | Ht 73.0 in | Wt 201.0 lb

## 2022-03-05 DIAGNOSIS — C439 Malignant melanoma of skin, unspecified: Secondary | ICD-10-CM | POA: Diagnosis not present

## 2022-03-05 DIAGNOSIS — Z79899 Other long term (current) drug therapy: Secondary | ICD-10-CM | POA: Diagnosis not present

## 2022-03-05 DIAGNOSIS — R200 Anesthesia of skin: Secondary | ICD-10-CM | POA: Diagnosis not present

## 2022-03-05 DIAGNOSIS — C773 Secondary and unspecified malignant neoplasm of axilla and upper limb lymph nodes: Secondary | ICD-10-CM | POA: Diagnosis not present

## 2022-03-05 DIAGNOSIS — Z801 Family history of malignant neoplasm of trachea, bronchus and lung: Secondary | ICD-10-CM | POA: Diagnosis not present

## 2022-03-05 DIAGNOSIS — E039 Hypothyroidism, unspecified: Secondary | ICD-10-CM | POA: Diagnosis not present

## 2022-03-05 DIAGNOSIS — R5383 Other fatigue: Secondary | ICD-10-CM | POA: Diagnosis not present

## 2022-03-05 DIAGNOSIS — R202 Paresthesia of skin: Secondary | ICD-10-CM | POA: Diagnosis not present

## 2022-03-05 DIAGNOSIS — Z5112 Encounter for antineoplastic immunotherapy: Secondary | ICD-10-CM | POA: Diagnosis not present

## 2022-03-05 DIAGNOSIS — Z79890 Hormone replacement therapy (postmenopausal): Secondary | ICD-10-CM | POA: Diagnosis not present

## 2022-03-05 DIAGNOSIS — C779 Secondary and unspecified malignant neoplasm of lymph node, unspecified: Secondary | ICD-10-CM

## 2022-03-05 DIAGNOSIS — B029 Zoster without complications: Secondary | ICD-10-CM | POA: Diagnosis not present

## 2022-03-05 DIAGNOSIS — G629 Polyneuropathy, unspecified: Secondary | ICD-10-CM

## 2022-03-05 LAB — CBC WITH DIFFERENTIAL/PLATELET
Abs Immature Granulocytes: 0.02 10*3/uL (ref 0.00–0.07)
Basophils Absolute: 0.1 10*3/uL (ref 0.0–0.1)
Basophils Relative: 1 %
Eosinophils Absolute: 0.2 10*3/uL (ref 0.0–0.5)
Eosinophils Relative: 6 %
HCT: 42.5 % (ref 39.0–52.0)
Hemoglobin: 14.4 g/dL (ref 13.0–17.0)
Immature Granulocytes: 1 %
Lymphocytes Relative: 35 %
Lymphs Abs: 1.4 10*3/uL (ref 0.7–4.0)
MCH: 31.4 pg (ref 26.0–34.0)
MCHC: 33.9 g/dL (ref 30.0–36.0)
MCV: 92.6 fL (ref 80.0–100.0)
Monocytes Absolute: 0.4 10*3/uL (ref 0.1–1.0)
Monocytes Relative: 11 %
Neutro Abs: 1.9 10*3/uL (ref 1.7–7.7)
Neutrophils Relative %: 46 %
Platelets: 229 10*3/uL (ref 150–400)
RBC: 4.59 MIL/uL (ref 4.22–5.81)
RDW: 13.8 % (ref 11.5–15.5)
WBC: 4 10*3/uL (ref 4.0–10.5)
nRBC: 0 % (ref 0.0–0.2)

## 2022-03-05 LAB — COMPREHENSIVE METABOLIC PANEL
ALT: 38 U/L (ref 0–44)
AST: 43 U/L — ABNORMAL HIGH (ref 15–41)
Albumin: 3.6 g/dL (ref 3.5–5.0)
Alkaline Phosphatase: 60 U/L (ref 38–126)
Anion gap: 6 (ref 5–15)
BUN: 16 mg/dL (ref 8–23)
CO2: 25 mmol/L (ref 22–32)
Calcium: 8.9 mg/dL (ref 8.9–10.3)
Chloride: 107 mmol/L (ref 98–111)
Creatinine, Ser: 1.18 mg/dL (ref 0.61–1.24)
GFR, Estimated: >60 mL/min (ref >60)
Glucose, Bld: 106 mg/dL — ABNORMAL HIGH (ref 70–99)
Potassium: 4.1 mmol/L (ref 3.5–5.1)
Sodium: 138 mmol/L (ref 135–145)
Total Bilirubin: 0.6 mg/dL (ref 0.3–1.2)
Total Protein: 6.3 g/dL — ABNORMAL LOW (ref 6.5–8.1)

## 2022-03-05 LAB — TSH: TSH: 17.638 u[IU]/mL — ABNORMAL HIGH (ref 0.350–4.500)

## 2022-03-05 MED ORDER — SODIUM CHLORIDE 0.9 % IV SOLN
Freq: Once | INTRAVENOUS | Status: AC
  Filled 2022-03-05: qty 250

## 2022-03-05 MED ORDER — SODIUM CHLORIDE 0.9 % IV SOLN
200.0000 mg | Freq: Once | INTRAVENOUS | Status: AC
  Administered 2022-03-05: 200 mg via INTRAVENOUS
  Filled 2022-03-05: qty 200

## 2022-03-05 NOTE — Progress Notes (Signed)
Needs advice on a topical cream given by Dr Nehemiah Massed, dermatology, Calcipotriene/fluorouracil.  B/l hand numbness, more so right hand, pain, discomfort x3weeks plus.More frequent now. Mostly in the a.m.  Last week feet, ankles and feet swollen. Since home from vacation and propping up has gotten better.  More fatigued.

## 2022-03-05 NOTE — Progress Notes (Signed)
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   There is sufficient material for ancillary molecular testing if needed  (block A3, A1, A2).   Comment:  Per provided outside pathology report the patient had an "at least pT1b"  malignant melanoma of the left lateral infraclavicular skin in 2015.  Touch preparations demonstrate predominantly discohesive large  pleomorphic cells, some with binucleation.  HE sections demonstrate  lymph node parenchyma involved by metastatic neoplasm.  The neoplastic  cells are pleomorphic with binucleation, focal intranuclear inclusions,  and associated tumor necrosis. The neoplastic cells demonstrate the  following pattern of immunoreactivity:  S100: Positive  SOX10: Positive  HMB-45: Negative  Melan-A: Negative  Super pancytokeratin: Negative  CD45: Negative  This pattern of immunoreactivity supports the above diagnosis.   # NOV 7th, 2022-neoadjuvant Keytruda cycle #1  # JAN, 26th, [name],  majority of these foci measure 1-2 mm in greatest dimension, with the largest measuring 4 mm.  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  Accompanied by his wife.  [name] 66 y.o.  male pleasant patient above history of recurrent melanoma left axillary lymphadenopathy-currently on adjuvant [name] is here for follow-up.  Last visit patient was noted to be hypothyroid; started on Synthroid.  Complains of fatigue.  Complains of tingling and numbness of the upper extremities.  None in the lower extremities.  Complains of dependent edema in the legs after his vacation.  Currently resolved.  No shortness of breath or cough.  Appetite is good.  No weight loss no nausea vomiting.  Review of Systems  Constitutional:  Negative for chills, diaphoresis, fever, malaise/fatigue and weight loss.  HENT:  Negative for nosebleeds and sore throat.   Eyes:  Negative for double vision.  Respiratory:  Negative for hemoptysis, sputum production, shortness of breath and wheezing.   Cardiovascular:  Negative for chest pain, palpitations, orthopnea and leg swelling.  Gastrointestinal:  Negative for abdominal pain, blood in stool, constipation, diarrhea, heartburn, melena, nausea and vomiting.  Genitourinary:  Negative for dysuria, frequency and urgency.  Musculoskeletal:  Negative for back pain and joint pain.  Skin: Negative.  Negative for itching and rash.  Neurological:  Negative for dizziness, tingling, focal weakness and weakness.  Endo/Heme/Allergies:  Does not bruise/bleed easily.  Psychiatric/Behavioral:  Negative for depression. The patient is not nervous/anxious and  does not have insomnia.      PAST MEDICAL HISTORY :  Past Medical History:  Diagnosis Date   Basal cell carcinoma 03/10/2013   Right medial forehead.  Excised, margins free.   Dysplastic nevus 11/05/2006   Mid back paraspinal, [name], close to edge. Excised 12/24/2006, margins free.   Dysplastic nevus 12/15/2008   Left lat. pectoral area. Moderate atypia, extends to one edge.    Dysplastic nevus 12/07/2009   Left mid side. Moderate atypia, close to margin.   Dysplastic nevus 12/07/2009   Right posterior waistline. Moderate atypia, close to margin.   Dysplastic nevus 02/10/2014   Left paraspinal mid back. Moderate atypia, lateral and deep margin involved.    Dysplastic nevus 06/16/2014   Left epigastric. Mild atypia, deep margin involved.    GERD (gastroesophageal reflux disease)    Hx of basal cell carcinoma    multiple sites   Hx of malignant melanoma 11/11/2013   L lateral infraclavicular, superficial spreading, Breslow's 0.61m, Clark's level III   Hypothyroidism    Melanoma ([facility] [place] 11/11/2013   Left lateral infraclavicular. MM, superficial spreading. Anatomic level III. Tumor thickness 0.638m Excised 11/24/2013, margins free.     PAST SURGICAL HISTORY :   Past Surgical History:  Procedure Laterality Date   AXILLARY LYMPH NODE DISSECTION Left 11/02/2021   Procedure: AXILLARY LYMPH NODE DISSECTION;  [name];  [name];  Service: General;  Laterality: Left;   COLONOSCOPY WITH PROPOFOL N/A 01/20/2017   Procedure: COLONOSCOPY WITH PROPOFOL;  [name];  [facility];  Service: Endoscopy;  [name];   ESOPHAGOGASTRODUODENOSCOPY (EGD) WITH ESOPHAGEAL DILATION     MELANOMA EXCISION Right 11/24/2013   lateral infraclavicular   WISDOM TOOTH EXTRACTION     WRIST SURGERY Left 2022    FAMILY HISTORY :   Family History  Problem Relation Age of Onset   Macular degeneration Mother    Hyperlipidemia Mother    Heart disease Father    Heart attack Father    Lung cancer Father        metastasized    SOCIAL HISTORY:   Social  History   Tobacco Use   Smoking status: Never   Smokeless tobacco: Never  Vaping Use   Vaping Use: Never used  Substance Use Topics   Alcohol use: Yes    Comment: about 1-3 glass of wine with dinner   Drug use: No    ALLERGIES:  has No Known Allergies.  MEDICATIONS:  Current Outpatient Medications  Medication Sig Dispense Refill   aspirin 81 MG tablet Take 81 mg by mouth daily.     fluorouracil (EFUDEX) 5 % cream Apply to scalp twice a day for 7 days, 40 g 1   levothyroxine (SYNTHROID) 100 MCG tablet Take 1 tablet (100 mcg total) by mouth daily. 30 tablet 3   Multiple Vitamins tablet Take 1 tablet by mouth daily.     Multiple Vitamins-Minerals (PRESERVISION AREDS 2 PO) Take 1 capsule by mouth in the morning and at bedtime.     pantoprazole (PROTONIX) 20 MG tablet Take 20 mg by mouth daily.  0   Pembrolizumab (KEYTRUDA IV) Inject 1 Dose into the vein every 21 ( twenty-one) days.     levothyroxine (SYNTHROID) 50 MCG tablet TAKE 1 TABLET BY MOUTH EVERY DAY 90 tablet 1   No current facility-administered medications for this visit.   Facility-Administered Medications Ordered in Other Visits  Medication  Dose Route Frequency Provider Last Rate Last Admin   pembrolizumab (KEYTRUDA) 200 mg in sodium chloride 0.9 % 50 mL chemo infusion  200 mg Intravenous Once Cammie Sickle, MD        PHYSICAL EXAMINATION: ECOG PERFORMANCE STATUS: 0 - Asymptomatic  BP 131/89 (BP Location: Left Arm, Patient Position: Sitting, Cuff Size: Normal)   Pulse 74   Temp 97.7 F (36.5 C)   Ht '6\' 1"'$  (1.854 m)   Wt 201 lb (91.2 kg)   SpO2 98%   BMI 26.52 kg/m   Filed Weights   03/05/22 0919  Weight: 201 lb (91.2 kg)    Positive for lymph node left underarm approximate 2 to 3 cm in size.  Physical Exam Vitals and nursing note reviewed.  HENT:     Head: Normocephalic and atraumatic.     Mouth/Throat:     Pharynx: Oropharynx is clear.  Eyes:     Extraocular Movements: Extraocular movements  intact.     Pupils: Pupils are equal, round, and reactive to light.  Cardiovascular:     Rate and Rhythm: Normal rate and regular rhythm.  Pulmonary:     Comments: Decreased breath sounds bilaterally.  Abdominal:     Palpations: Abdomen is soft.  Musculoskeletal:        General: Normal range of motion.     Cervical back: Normal range of motion.  Skin:    General: Skin is warm.  Neurological:     General: No focal deficit present.     Mental Status: He is alert and oriented to person, place, and time.  Psychiatric:        Behavior: Behavior normal.        Judgment: Judgment normal.       LABORATORY DATA:  I have reviewed the data as listed    Component Value Date/Time   NA 138 03/05/2022 0903   NA 145 (H) 06/26/2021 1416   K 4.1 03/05/2022 0903   CL 107 03/05/2022 0903   CO2 25 03/05/2022 0903   GLUCOSE 106 (H) 03/05/2022 0903   BUN 16 03/05/2022 0903   BUN 16 06/26/2021 1416   CREATININE 1.18 03/05/2022 0903   CALCIUM 8.9 03/05/2022 0903   PROT 6.3 (L) 03/05/2022 0903   PROT 6.7 06/26/2021 1416   ALBUMIN 3.6 03/05/2022 0903   ALBUMIN 4.6 06/26/2021 1416   AST 43 (H) 03/05/2022 0903   ALT 38 03/05/2022 0903   ALKPHOS 60 03/05/2022 0903   BILITOT 0.6 03/05/2022 0903   BILITOT 0.4 06/26/2021 1416   GFRNONAA >60 03/05/2022 0903   GFRAA 75 05/10/2020 1017    No results found for: SPEP, UPEP  Lab Results  Component Value Date   WBC 4.0 03/05/2022   NEUTROABS 1.9 03/05/2022   HGB 14.4 03/05/2022   HCT 42.5 03/05/2022   MCV 92.6 03/05/2022   PLT 229 03/05/2022      Chemistry      Component Value Date/Time   NA 138 03/05/2022 0903   NA 145 (H) 06/26/2021 1416   K 4.1 03/05/2022 0903   CL 107 03/05/2022 0903   CO2 25 03/05/2022 0903   BUN 16 03/05/2022 0903   BUN 16 06/26/2021 1416   CREATININE 1.18 03/05/2022 0903   GLU 96 10/19/2014 0000      Component Value Date/Time   CALCIUM 8.9 03/05/2022 0903   ALKPHOS 60 03/05/2022 0903   AST 43 (H)  03/05/2022 0903   ALT 38 03/05/2022 0903  BILITOT 0.6 03/05/2022 0903   BILITOT 0.4 06/26/2021 1416       RADIOGRAPHIC STUDIES: I have personally reviewed the radiological images as listed and agreed with the findings in the report. No results found.   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  S/p neoadjuvant Keytruda x4- -Status post axillary lymph node dissection-with residual micrometastatic disease.  Reviewed the note from [place] at [facility] role for radiation.   # proceed with with [place] #9  today of planned total 18. Labs today reviewed;  acceptable for treatment today.   # Fatigue: likely related to hypothyroidism-see below.  Monitor TSH closely.  Await TSH from today  #Iatrogenic hypothyroidism [April 2023; TSH- 44]-on Synthroid 100 mcg once a day.   # Upper Extremity numbness [Right > Left] in AM- ? Sec to hypothyroidism- refer to neurology.   # Shingles- s/p valacyclovir improved.   # DISPOSITION: needs Monday/early AM schedule  # referral to [facility]; neurology re peripheral neuropathy.  # Nat Math today # follow up in 3 weeks- MD; labs- cbc/cmp;Keytruda-- Dr.B      Orders Placed This Encounter  Procedures   Ambulatory referral to Neurology    Referral Priority:   Routine    Referral Type:   Consultation    Referral Reason:   Specialty Services Required    Referred to Provider:   Vladimir Crofts, MD    Requested Specialty:   Neurology    Number of Visits Requested:   1    All questions were answered. The patient knows to call the clinic with any problems, questions or concerns.      Cammie Sickle, MD 03/05/2022 10:27 AM

## 2022-03-05 NOTE — Assessment & Plan Note (Addendum)
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  S/p neoadjuvant Keytruda x4- -Status post axillary lymph node dissection-with residual micrometastatic disease.  Reviewed the note from [place] at [facility] role for radiation.   # proceed with with [place] #9  today of planned total 18. Labs today reviewed;  acceptable for treatment today.   # Fatigue: likely related to hypothyroidism-see below.  Monitor TSH closely.  Await TSH from today  #Iatrogenic hypothyroidism [April 2023; TSH- 44]-on Synthroid 100 mcg once a day.   # Upper Extremity numbness [Right > Left] in AM- ? Sec to hypothyroidism- refer to neurology.   # Shingles- s/p valacyclovir improved.   # DISPOSITION: needs Monday/early AM schedule  # referral to [place]; neurology re peripheral neuropathy.  # Nat Math today # follow up in 3 weeks- MD; labs- cbc/cmp;Keytruda-- Dr.B

## 2022-03-05 NOTE — Patient Instructions (Signed)
[facility]  Discharge Instructions: °Thank you for choosing [facility] to provide your oncology and hematology care.  °If you have a lab appointment with the Cancer Center, please go directly to the Cancer Center and check in at the registration area. ° °Wear comfortable clothing and clothing appropriate for easy access to any Portacath or PICC line.  ° °We strive to give you quality time with your provider. You may need to reschedule your appointment if you arrive late (15 or more minutes).  Arriving late affects you and other patients whose appointments are after yours.  Also, if you miss three or more appointments without notifying the office, you may be dismissed from the clinic at the provider’s discretion.    °  °For prescription refill requests, have your pharmacy contact our office and allow 72 hours for refills to be completed.   ° °Today you received the following chemotherapy and/or immunotherapy agents Keytruda °    °  °To help prevent nausea and vomiting after your treatment, we encourage you to take your nausea medication as directed. ° °BELOW ARE SYMPTOMS THAT SHOULD BE REPORTED IMMEDIATELY: °*FEVER GREATER THAN 100.4 F (38 °C) OR HIGHER °*CHILLS OR SWEATING °*NAUSEA AND VOMITING THAT IS NOT CONTROLLED WITH YOUR NAUSEA MEDICATION °*UNUSUAL SHORTNESS OF BREATH °*UNUSUAL BRUISING OR BLEEDING °*URINARY PROBLEMS (pain or burning when urinating, or frequent urination) °*BOWEL PROBLEMS (unusual diarrhea, constipation, pain near the anus) °TENDERNESS IN MOUTH AND THROAT WITH OR WITHOUT PRESENCE OF ULCERS (sore throat, sores in mouth, or a toothache) °UNUSUAL RASH, SWELLING OR PAIN  °UNUSUAL VAGINAL DISCHARGE OR ITCHING  ° °Items with * indicate a potential emergency and should be followed up as soon as possible or go to the Emergency Department if any problems should occur. ° °Please show the CHEMOTHERAPY ALERT CARD or IMMUNOTHERAPY ALERT CARD at check-in to  the Emergency Department and triage nurse. ° °Should you have questions after your visit or need to cancel or reschedule your appointment, please contact [facility]  [phone] and follow the prompts.  Office hours are 8:00 a.m. to 4:30 p.m. Monday - Friday. Please note that voicemails left after 4:00 p.m. may not be returned until the following business day.  We are closed weekends and major holidays. You have access to a nurse at all times for urgent questions. Please call the main number to the clinic [phone] and follow the prompts. ° °For any non-urgent questions, you may also contact your provider using MyChart. We now offer e-Visits for anyone 18 and older to request care online for non-urgent symptoms. For details visit [email or web address]. °  °Also download the MyChart app! Go to the app store, search "MyChart", open the app, select [place], and log in with your MyChart username and password. ° °Due to Covid, a mask is required upon entering the hospital/clinic. If you do not have a mask, one will be given to you upon arrival. For doctor visits, patients may have 1 support person aged 18 or older with them. For treatment visits, patients cannot have anyone with them due to current Covid guidelines and our immunocompromised population.  °

## 2022-03-19 ENCOUNTER — Encounter: Payer: Self-pay | Admitting: Internal Medicine

## 2022-03-20 DIAGNOSIS — M5033 Other cervical disc degeneration, cervicothoracic region: Secondary | ICD-10-CM | POA: Diagnosis not present

## 2022-03-20 DIAGNOSIS — M9903 Segmental and somatic dysfunction of lumbar region: Secondary | ICD-10-CM | POA: Diagnosis not present

## 2022-03-20 DIAGNOSIS — M62830 Muscle spasm of back: Secondary | ICD-10-CM | POA: Diagnosis not present

## 2022-03-20 DIAGNOSIS — M9901 Segmental and somatic dysfunction of cervical region: Secondary | ICD-10-CM | POA: Diagnosis not present

## 2022-03-25 ENCOUNTER — Inpatient Hospital Stay: Payer: Medicare HMO | Attending: Oncology

## 2022-03-25 ENCOUNTER — Inpatient Hospital Stay: Payer: Medicare HMO

## 2022-03-25 ENCOUNTER — Other Ambulatory Visit: Payer: Self-pay

## 2022-03-25 ENCOUNTER — Inpatient Hospital Stay (HOSPITAL_BASED_OUTPATIENT_CLINIC_OR_DEPARTMENT_OTHER): Payer: Medicare HMO | Admitting: Internal Medicine

## 2022-03-25 ENCOUNTER — Encounter: Payer: Self-pay | Admitting: Internal Medicine

## 2022-03-25 ENCOUNTER — Ambulatory Visit
Admission: RE | Admit: 2022-03-25 | Discharge: 2022-03-25 | Disposition: A | Discharge status: Home / Self Care | Payer: Medicare HMO | Source: Ambulatory Visit | Attending: Internal Medicine | Admitting: Internal Medicine

## 2022-03-25 VITALS — BP 106/75 | HR 80 | Temp 97.8°F | Ht 73.0 in | Wt 197.6 lb

## 2022-03-25 DIAGNOSIS — C4359 Malignant melanoma of other part of trunk: Secondary | ICD-10-CM | POA: Insufficient documentation

## 2022-03-25 DIAGNOSIS — E039 Hypothyroidism, unspecified: Secondary | ICD-10-CM | POA: Insufficient documentation

## 2022-03-25 DIAGNOSIS — C439 Malignant melanoma of skin, unspecified: Secondary | ICD-10-CM | POA: Insufficient documentation

## 2022-03-25 DIAGNOSIS — M542 Cervicalgia: Secondary | ICD-10-CM

## 2022-03-25 DIAGNOSIS — M791 Myalgia, unspecified site: Secondary | ICD-10-CM | POA: Diagnosis not present

## 2022-03-25 DIAGNOSIS — C779 Secondary and unspecified malignant neoplasm of lymph node, unspecified: Secondary | ICD-10-CM

## 2022-03-25 DIAGNOSIS — Z5112 Encounter for antineoplastic immunotherapy: Secondary | ICD-10-CM | POA: Insufficient documentation

## 2022-03-25 DIAGNOSIS — Z79899 Other long term (current) drug therapy: Secondary | ICD-10-CM | POA: Insufficient documentation

## 2022-03-25 DIAGNOSIS — R5383 Other fatigue: Secondary | ICD-10-CM | POA: Diagnosis not present

## 2022-03-25 DIAGNOSIS — C773 Secondary and unspecified malignant neoplasm of axilla and upper limb lymph nodes: Secondary | ICD-10-CM | POA: Insufficient documentation

## 2022-03-25 LAB — CBC WITH DIFFERENTIAL/PLATELET
Abs Immature Granulocytes: 0.02 10*3/uL (ref 0.00–0.07)
Basophils Absolute: 0 10*3/uL (ref 0.0–0.1)
Basophils Relative: 1 %
Eosinophils Absolute: 0.3 10*3/uL (ref 0.0–0.5)
Eosinophils Relative: 7 %
HCT: 38.6 % — ABNORMAL LOW (ref 39.0–52.0)
Hemoglobin: 13.3 g/dL (ref 13.0–17.0)
Immature Granulocytes: 1 %
Lymphocytes Relative: 38 %
Lymphs Abs: 1.5 10*3/uL (ref 0.7–4.0)
MCH: 32 pg (ref 26.0–34.0)
MCHC: 34.5 g/dL (ref 30.0–36.0)
MCV: 92.8 fL (ref 80.0–100.0)
Monocytes Absolute: 0.5 10*3/uL (ref 0.1–1.0)
Monocytes Relative: 13 %
Neutro Abs: 1.6 10*3/uL — ABNORMAL LOW (ref 1.7–7.7)
Neutrophils Relative %: 40 %
Platelets: 213 10*3/uL (ref 150–400)
RBC: 4.16 MIL/uL — ABNORMAL LOW (ref 4.22–5.81)
RDW: 12.4 % (ref 11.5–15.5)
WBC: 3.8 10*3/uL — ABNORMAL LOW (ref 4.0–10.5)
nRBC: 0 % (ref 0.0–0.2)

## 2022-03-25 LAB — COMPREHENSIVE METABOLIC PANEL
ALT: 60 U/L — ABNORMAL HIGH (ref 0–44)
AST: 53 U/L — ABNORMAL HIGH (ref 15–41)
Albumin: 3.4 g/dL — ABNORMAL LOW (ref 3.5–5.0)
Alkaline Phosphatase: 68 U/L (ref 38–126)
Anion gap: 7 (ref 5–15)
BUN: 18 mg/dL (ref 8–23)
CO2: 24 mmol/L (ref 22–32)
Calcium: 9 mg/dL (ref 8.9–10.3)
Chloride: 106 mmol/L (ref 98–111)
Creatinine, Ser: 1.26 mg/dL — ABNORMAL HIGH (ref 0.61–1.24)
GFR, Estimated: >60 mL/min (ref >60)
Glucose, Bld: 120 mg/dL — ABNORMAL HIGH (ref 70–99)
Potassium: 4 mmol/L (ref 3.5–5.1)
Sodium: 137 mmol/L (ref 135–145)
Total Bilirubin: 0.8 mg/dL (ref 0.3–1.2)
Total Protein: 5.9 g/dL — ABNORMAL LOW (ref 6.5–8.1)

## 2022-03-25 LAB — CK: Total CK: 116 U/L (ref 49–397)

## 2022-03-25 LAB — TSH: TSH: 8.116 u[IU]/mL — ABNORMAL HIGH (ref 0.350–4.500)

## 2022-03-25 IMAGING — MR MR CERVICAL SPINE WO/W CM
5 of 9 series · 28 of 48 positions shown · IV contrast (gadavist)
Comparison: None Available.

CLINICAL DATA: Neck pain, acute, known malignancy. Bilateral upper
extremity neuropathy. History of melanoma

EXAM:
MRI CERVICAL SPINE WITHOUT AND WITH CONTRAST
TECHNIQUE: Multiplanar and multiecho pulse sequences of the cervical spine, to
include the craniocervical junction and cervicothoracic junction,
were obtained without and with intravenous contrast.
CONTRAST:  9mL GADAVIST GADOBUTROL 1 MMOL/ML IV SOLN

[Series 5: T2 · sagittal · 3.0mm · 0.62mm/px · 4 of 15 slices shown (1 of 2)]
[im 1/15]
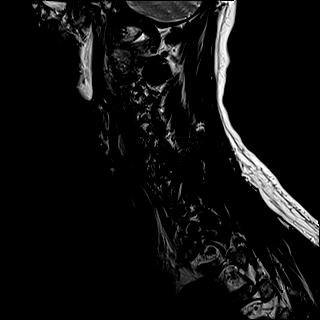
[im 5/15]
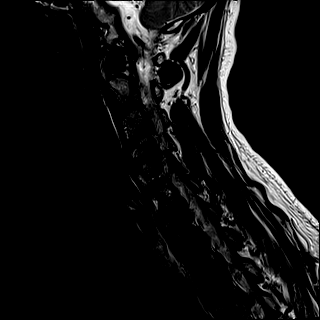
[im 10/15]
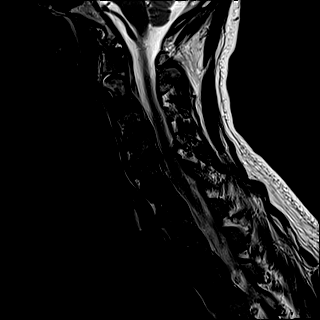
[im 15/15]
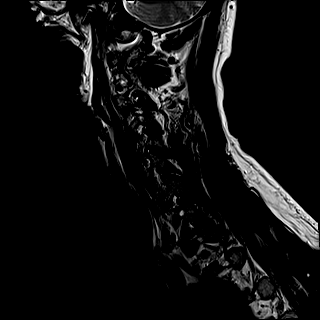

[Series 7: STIR · sagittal · 3.0mm · 0.62mm/px · 4 of 15 slices shown]
[im 1/15]
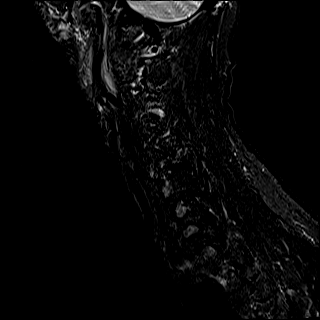
[im 5/15]
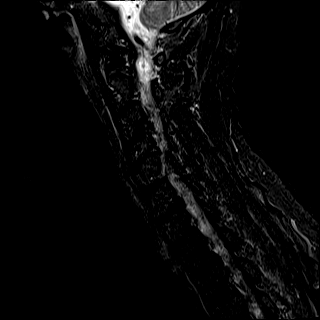
[im 10/15]
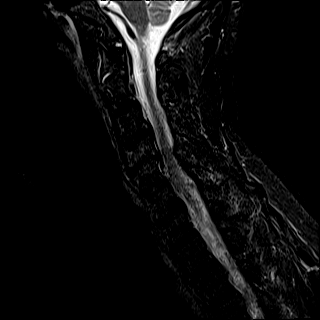
[im 15/15]
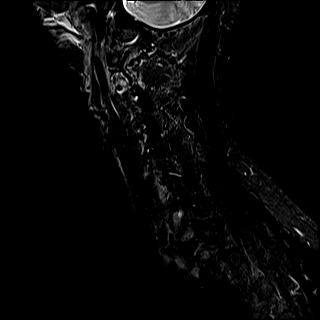

[Series 8: T2 · axial · 3.0mm · 0.70mm/px · z∈[-86,+12]mm · 7 of 30 slices shown (2 of 2)]
[im 1/30]
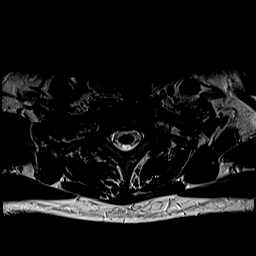
[im 5/30]
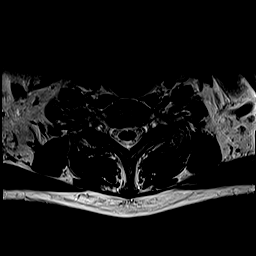
[im 10/30]
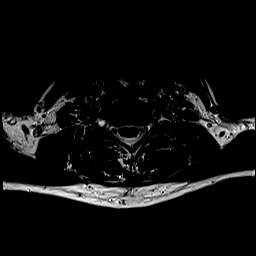
[im 15/30]
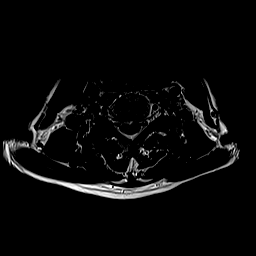
[im 20/30]
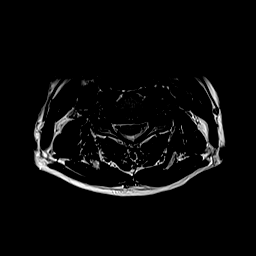
[im 25/30]
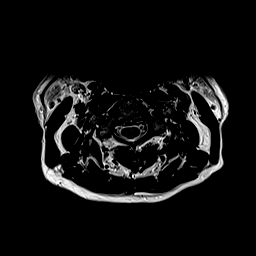
[im 30/30]
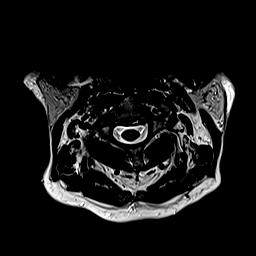

[Series 15: T1 · axial · non-contrast · 3.0mm · 0.35mm/px · z∈[-105,-13]mm · 7 of 31 slices shown]
[im 1/31]
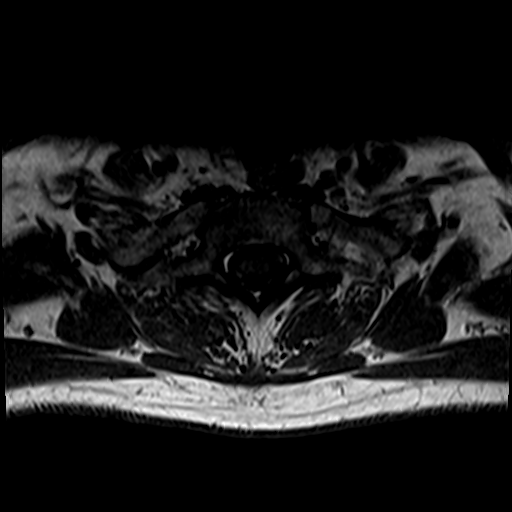
[im 6/31]
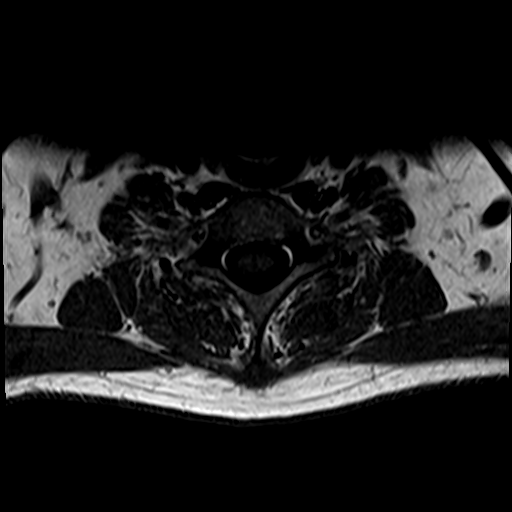
[im 11/31]
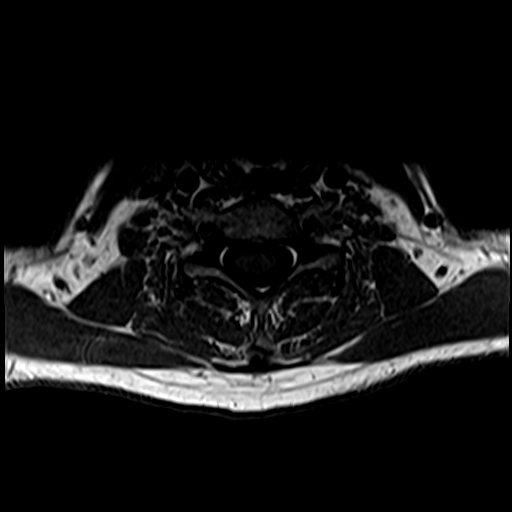
[im 16/31]
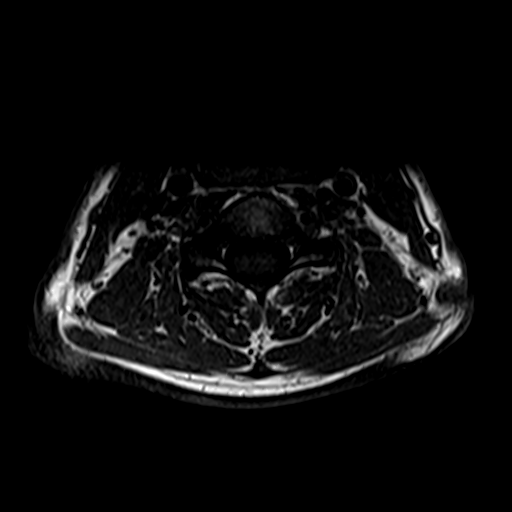
[im 21/31]
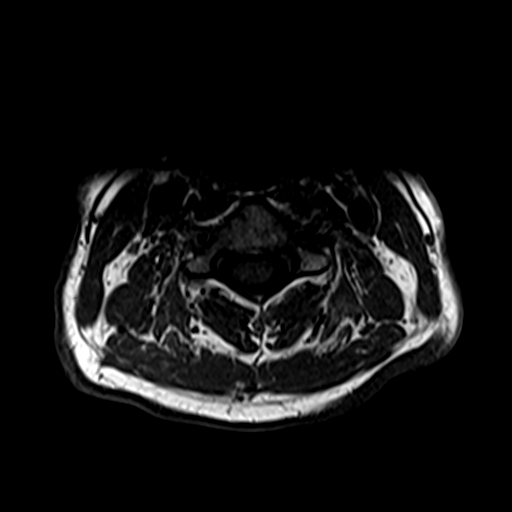
[im 26/31]
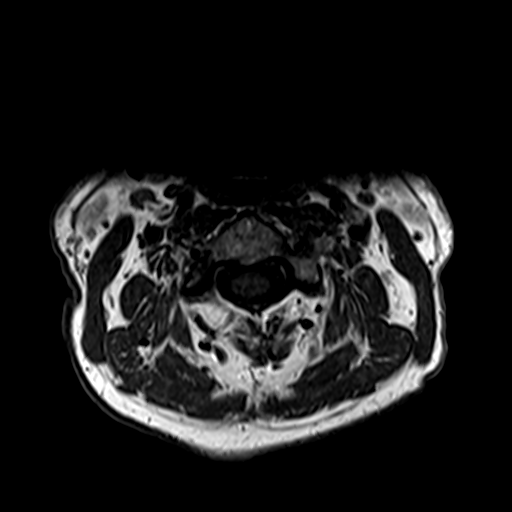
[im 31/31]
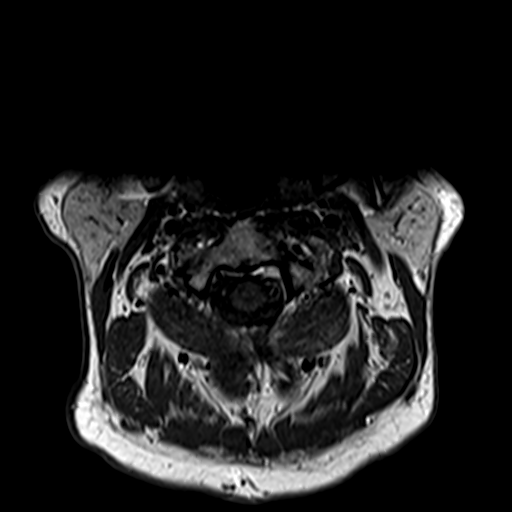

[Series 17: T1 post-contrast · axial · 3.0mm · 0.35mm/px · z∈[-105,-28]mm · 6 of 31 slices shown]
[im 1/31]
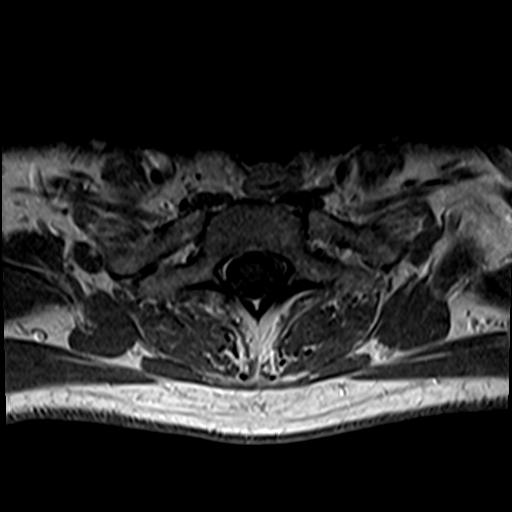
[im 6/31]
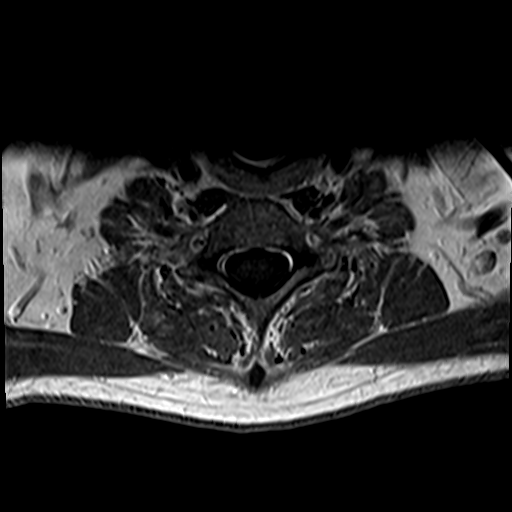
[im 11/31]
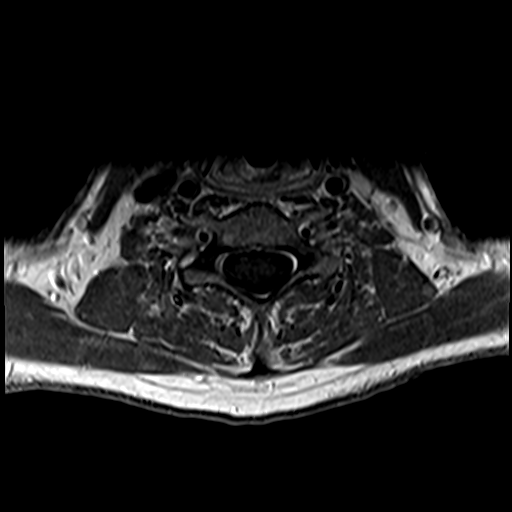
[im 16/31]
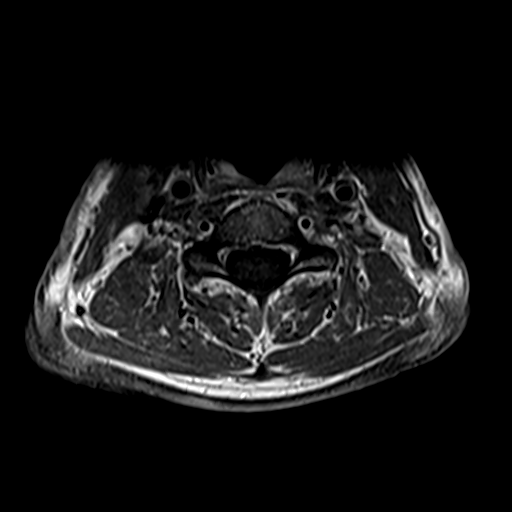
[im 21/31]
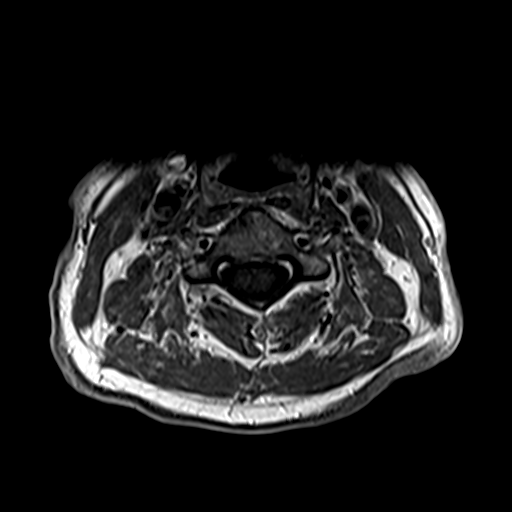
[im 26/31]
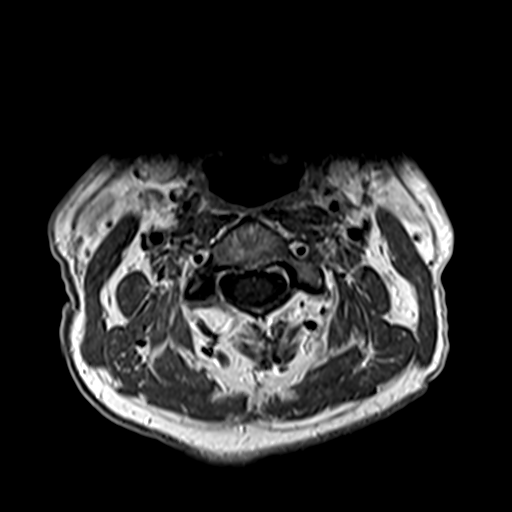

[28 of 48 positions shown; findings below may reference images not displayed]

FINDINGS: Images had to be relocalized due to power outage per report.

Alignment: Straightening of cervical lordosis with mild C4-5
retrolisthesis and C5-6 anterolisthesis

Vertebrae: No fracture, evidence of discitis, or bone lesion.

Cord: Normal signal and morphology.

Posterior Fossa, vertebral arteries, paraspinal tissues: Negative

Disc levels:

C2-3: No significant degenerative change or neural impingement.

C3-4: Disc narrowing and bulging with bilateral uncovertebral
spurring. Mild bilateral foraminal narrowing.

C4-5: Level of greatest degenerative disc space narrowing with
endplate and uncovertebral ridging. Mild discogenic endplate edema.
Uncovertebral spurs cause moderate bilateral foraminal stenosis.

C5-6: Degenerative facet spurring asymmetric to the right.
Unremarkable disc space. No neural impingement

C6-7: Disc narrowing and minor bulging.  No neural impingement

C7-T1:Unremarkable.
IMPRESSION: Cervical spine degeneration most notable at C4-5 where there is
moderate bilateral foraminal stenosis. Diffusely patent spinal
canal.

No evidence of metastatic disease to the cervical spine.

## 2022-03-25 MED ORDER — SODIUM CHLORIDE 0.9 % IV SOLN
Freq: Once | INTRAVENOUS | Status: AC
  Filled 2022-03-25: qty 250

## 2022-03-25 MED ORDER — GADOBUTROL 1 MMOL/ML IV SOLN
9.0000 mL | Freq: Once | INTRAVENOUS | Status: AC | PRN
  Administered 2022-03-25: 9 mL via INTRAVENOUS

## 2022-03-25 MED ORDER — SODIUM CHLORIDE 0.9 % IV SOLN
200.0000 mg | Freq: Once | INTRAVENOUS | Status: AC
  Administered 2022-03-25: 200 mg via INTRAVENOUS
  Filled 2022-03-25: qty 8

## 2022-03-25 NOTE — Assessment & Plan Note (Addendum)
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  S/p neoadjuvant Keytruda x4- -Status post axillary lymph node dissection-with residual micrometastatic disease.  Reviewed the note from [place] at [facility] role for radiation.   # proceed with with keytruda #10.  today of planned total 18. Labs today reviewed;  acceptable for treatment today.   # Upper Extremity numbness/pain  [Right > Left] in AM- ?  Unlikely secondary to hypothyroidism- refer to neurology.  I have also messaged Dr. Brigitte Pulse for your appointment.  Recommend cervical spine MRI with and without contrast.  ASAP.  # LFTs- Mild - likely to [place]. Monitor closley.   # Fatigue/myalgias: likely related to hypothyroidism-see below.  Monitor TSH closely.  Await TSH from today  #Iatrogenic hypothyroidism [April 2023; TSH- 44]-on Synthroid 100 mcg once a day.  May 2023 TSH 17.  Awaiting labs today.  # DISPOSITION: needs Monday/early AM schedule  # ADD TSH/CK to labs from today  [facility]- [facility]OND [facility].  # MRI spine- C-spine ASAP # [name] today # follow up in 3 weeks- MD; labs- cbc/cmp;Keytruda; Thyroid panel; random cortisol-- Dr.B  Addendum TSH 8: CK normal.

## 2022-03-25 NOTE — Progress Notes (Signed)
[place] OFFICE PROGRESS NOTE  Patient Care Team: Jerrol Banana., MD as PCP - General (Family Medicine) Cammie Sickle, MD as Consulting Physician (Oncology)   Cancer Staging  Malignant melanoma metastatic to lymph node [facility]) Staging form: Melanoma of the Skin, AJCC 8th Edition - Clinical: Stage III (cTX, [name], [name], MD on 08/13/2021   Oncology History Overview Note  #  2015- left collar bone [Dr.Kowalski]- s/p resection- 0.65 mm  #  A. 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   There is sufficient material for ancillary molecular testing if needed  (block A3, A1, A2).   Comment:  Per provided outside pathology report the patient had an "at least pT1b"  malignant melanoma of the left lateral infraclavicular skin in 2015.  Touch preparations demonstrate predominantly discohesive large  pleomorphic cells, some with binucleation.  HE sections demonstrate  lymph node parenchyma involved by metastatic neoplasm.  The neoplastic  cells are pleomorphic with binucleation, focal intranuclear inclusions,  and associated tumor necrosis. The neoplastic cells demonstrate the  following pattern of immunoreactivity:  S100: Positive  SOX10: Positive  HMB-45: Negative  Melan-A: Negative  Super pancytokeratin: Negative  CD45: Negative  This pattern of immunoreactivity supports the above diagnosis.   # NOV 7th, 2022-neoadjuvant Keytruda cycle #1  # JAN, 26th, [name],  majority of these foci measure 1-2 mm in greatest dimension, with the largest measuring 4 mm.  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  Accompanied by his wife.  [name] 66 y.o.  male pleasant patient above history of recurrent melanoma left axillary lymphadenopathy-currently on adjuvant [name] is here for follow-up.  Patient is also on Synthroid for his iatrogenic hypothyroidism.  Complains of tingling and numbness of his upper extremities right more than left. And pain in Right UE worse in night/AM.  Noted helped with NSAIDs prn.  Complains of fatigue.  Denies any headaches.  No shortness of breath or cough.  Appetite is fair.   No weight loss no nausea vomiting.  Review of Systems  Constitutional:  Negative for chills, diaphoresis, fever, malaise/fatigue and weight loss.  HENT:  Negative for nosebleeds and sore throat.   Eyes:  Negative for double vision.  Respiratory:  Negative for hemoptysis, sputum production, shortness of breath and wheezing.   Cardiovascular:  Negative for chest pain, palpitations, orthopnea and leg swelling.  Gastrointestinal:  Negative for abdominal pain, blood in stool, constipation, diarrhea, heartburn, melena, nausea and vomiting.  Genitourinary:  Negative for dysuria, frequency and urgency.  Musculoskeletal:  Positive for myalgias. Negative for back pain and joint pain.  Skin: Negative.  Negative for itching and rash.  Neurological:  Positive for tingling. Negative for dizziness, focal weakness and weakness.  Endo/Heme/Allergies:  Does not bruise/bleed easily.  Psychiatric/Behavioral:  Negative for depression. The patient  is not nervous/anxious and does not have insomnia.       PAST MEDICAL HISTORY :  Past Medical History:  Diagnosis Date   Basal cell carcinoma  03/10/2013   Right medial forehead. Excised, margins free.   Dysplastic nevus 11/05/2006   Mid back paraspinal, [name], close to edge. Excised 12/24/2006, margins free.   Dysplastic nevus 12/15/2008   Left lat. pectoral area. Moderate atypia, extends to one edge.    Dysplastic nevus 12/07/2009   Left mid side. Moderate atypia, close to margin.   Dysplastic nevus 12/07/2009   Right posterior waistline. Moderate atypia, close to margin.   Dysplastic nevus 02/10/2014   Left paraspinal mid back. Moderate atypia, lateral and deep margin involved.    Dysplastic nevus 06/16/2014   Left epigastric. Mild atypia, deep margin involved.    GERD (gastroesophageal reflux disease)    Hx of basal cell carcinoma    multiple sites   Hx of malignant melanoma 11/11/2013   L lateral infraclavicular, superficial spreading, Breslow's 0.14m, Clark's level III   Hypothyroidism    [place]anoma ([place] 11/11/2013   Left lateral infraclavicular. MM, superficial spreading. Anatomic level III. Tumor thickness 0.667m Excised 11/24/2013, margins free.     PAST SURGICAL HISTORY :   Past Surgical History:  Procedure Laterality Date   AXILLARY LYMPH NODE DISSECTION Left 11/02/2021   Procedure: AXILLARY LYMPH NODE DISSECTION;  [name];  [name];  Service: General;  Laterality: Left;   COLONOSCOPY WITH PROPOFOL N/A 01/20/2017   Procedure: COLONOSCOPY WITH PROPOFOL;  [name];  [facility];  Service: Endoscopy;  [name];   ESOPHAGOGASTRODUODENOSCOPY (EGD) WITH ESOPHAGEAL DILATION     MELANOMA EXCISION Right 11/24/2013   lateral infraclavicular   WISDOM TOOTH EXTRACTION     WRIST SURGERY Left 2022    FAMILY HISTORY :   Family History  Problem Relation Age of Onset   Macular degeneration Mother    Hyperlipidemia Mother    Heart disease Father    Heart attack Father    Lung cancer Father         metastasized    SOCIAL HISTORY:   Social History   Tobacco Use   Smoking status: Never   Smokeless tobacco: Never  Vaping Use   Vaping Use: Never used  Substance Use Topics   Alcohol use: Yes    Comment: about 1-3 glass of wine with dinner   Drug use: No    ALLERGIES:  has No Known Allergies.  MEDICATIONS:  Current Outpatient Medications  Medication Sig Dispense Refill   aspirin 81 MG tablet Take 81 mg by mouth daily.     levothyroxine (SYNTHROID) 100 MCG tablet Take 1 tablet (100 mcg total) by mouth daily. 30 tablet 3   Multiple Vitamins tablet Take 1 tablet by mouth daily.     Multiple Vitamins-Minerals (PRESERVISION AREDS 2 PO) Take 1 capsule by mouth in the morning and at bedtime.     pantoprazole (PROTONIX) 20 MG tablet Take 20 mg by mouth daily.  0   Pembrolizumab (KEYTRUDA IV) Inject 1 Dose into the vein every 21 ( twenty-one) days.     fluorouracil (EFUDEX) 5 % cream Apply to scalp twice a day for 7 days, (Patient not taking: Reported on 03/25/2022) 40 g 1   No current facility-administered medications for this visit.    PHYSICAL EXAMINATION: ECOG PERFORMANCE STATUS: 0 - Asymptomatic  BP 106/75 (BP Location:  Right Arm, Patient Position: Sitting, Cuff Size: Normal)   Pulse 80   Temp 97.8 F (36.6 C) (Tympanic)   Ht '6\' 1"'$  (1.854 m)   Wt 197 lb 9.6 oz (89.6 kg)   SpO2 97%   BMI 26.07 kg/m   Filed Weights   03/25/22 0850  Weight: 197 lb 9.6 oz (89.6 kg)     Positive for lymph node left underarm approximate 2 to 3 cm in size.  Physical Exam Vitals and nursing note reviewed.  HENT:     Head: Normocephalic and atraumatic.     Mouth/Throat:     Pharynx: Oropharynx is clear.  Eyes:     Extraocular Movements: Extraocular movements intact.     Pupils: Pupils are equal, round, and reactive to light.  Cardiovascular:     Rate and Rhythm: Normal rate and regular rhythm.  Pulmonary:     Comments: Decreased breath sounds bilaterally.  Abdominal:      Palpations: Abdomen is soft.  Musculoskeletal:        General: Normal range of motion.     Cervical back: Normal range of motion.  Skin:    General: Skin is warm.  Neurological:     General: No focal deficit present.     Mental Status: He is alert and oriented to person, place, and time.  Psychiatric:        Behavior: Behavior normal.        Judgment: Judgment normal.        LABORATORY DATA:  I have reviewed the data as listed    Component Value Date/Time   NA 137 03/25/2022 0843   NA 145 (H) 06/26/2021 1416   K 4.0 03/25/2022 0843   CL 106 03/25/2022 0843   CO2 24 03/25/2022 0843   GLUCOSE 120 (H) 03/25/2022 0843   BUN 18 03/25/2022 0843   BUN 16 06/26/2021 1416   CREATININE 1.26 (H) 03/25/2022 0843   CALCIUM 9.0 03/25/2022 0843   PROT 5.9 (L) 03/25/2022 0843   PROT 6.7 06/26/2021 1416   ALBUMIN 3.4 (L) 03/25/2022 0843   ALBUMIN 4.6 06/26/2021 1416   AST 53 (H) 03/25/2022 0843   ALT 60 (H) 03/25/2022 0843   ALKPHOS 68 03/25/2022 0843   BILITOT 0.8 03/25/2022 0843   BILITOT 0.4 06/26/2021 1416   GFRNONAA >60 03/25/2022 0843   GFRAA 75 05/10/2020 1017    No results found for: "SPEP", "UPEP"  Lab Results  Component Value Date   WBC 3.8 (L) 03/25/2022   NEUTROABS 1.6 (L) 03/25/2022   HGB 13.3 03/25/2022   HCT 38.6 (L) 03/25/2022   MCV 92.8 03/25/2022   PLT 213 03/25/2022      Chemistry      Component Value Date/Time   NA 137 03/25/2022 0843   NA 145 (H) 06/26/2021 1416   K 4.0 03/25/2022 0843   CL 106 03/25/2022 0843   CO2 24 03/25/2022 0843   BUN 18 03/25/2022 0843   BUN 16 06/26/2021 1416   CREATININE 1.26 (H) 03/25/2022 0843   GLU 96 10/19/2014 0000      Component Value Date/Time   CALCIUM 9.0 03/25/2022 0843   ALKPHOS 68 03/25/2022 0843   AST 53 (H) 03/25/2022 0843   ALT 60 (H) 03/25/2022 0843   BILITOT 0.8 03/25/2022 0843   BILITOT 0.4 06/26/2021 1416       RADIOGRAPHIC STUDIES: I have personally reviewed the radiological images as  listed and agreed with the findings in the report. No results  found.   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  S/p neoadjuvant Keytruda x4- -Status post axillary lymph node dissection-with residual micrometastatic disease.  Reviewed the note from [place] at [facility] role for radiation.   # proceed with with keytruda #10.  today of planned total 18. Labs today reviewed;  acceptable for treatment today.   # Upper Extremity numbness/pain  [Right > Left] in AM- ?  Unlikely secondary to hypothyroidism- refer to neurology.  I have also messaged Dr. Brigitte Pulse for your appointment.  Recommend cervical spine MRI with and without contrast.  ASAP.  # LFTs- Mild - likely to [place]. Monitor closley.   # Fatigue/myalgias: likely related to hypothyroidism-see below.  Monitor TSH closely.  Await TSH from today  #Iatrogenic hypothyroidism [April 2023; TSH- 44]-on Synthroid 100 mcg once a day.  May 2023 TSH 17.  Awaiting labs today.  # DISPOSITION: needs Monday/early AM schedule  # ADD TSH/CK to labs from today  # [facility]- [facility].  # MRI spine- C-spine ASAP # [name] today # follow up in 3 weeks- MD; labs- cbc/cmp;Keytruda; Thyroid panel; random cortisol-- Dr.B  Addendum TSH 8: CK normal.      Orders Placed This Encounter  Procedures   MR CERVICAL SPINE W WO CONTRAST    Standing Status:   Future    Standing Expiration Date:   03/26/2023    Order Specific Question:   If indicated for the ordered procedure, I authorize the administration of contrast media per Radiology protocol    Answer:   Yes    Order Specific Question:   What is the patient's sedation requirement?    Answer:   No Sedation    Order Specific Question:   Does the patient have a pacemaker or implanted devices?    Answer:   No    Order Specific Question:   Use SRS Protocol?     Answer:   No    Order Specific Question:   Preferred imaging location?    Answer:   [facility] (table limit - 550lbs)   TSH    Standing Status:   Future    Number of Occurrences:   1    Standing Expiration Date:   03/26/2023   CK    Standing Status:   Future    Number of Occurrences:   1    Standing Expiration Date:   03/26/2023   CBC with Differential/Platelet    Standing Status:   Future    Standing Expiration Date:   03/26/2023   Comprehensive metabolic panel    Standing Status:   Future    Standing Expiration Date:   03/26/2023   Thyroid Panel With TSH    Standing Status:   Future    Standing Expiration Date:   03/26/2023   Cortisol    Standing Status:   Future    Standing Expiration Date:   03/26/2023   Ambulatory referral to Neurology    Referral Priority:   Routine    Referral Type:   Consultation    Referral Reason:   Specialty Services Required    Requested Specialty:   Neurology    Number of Visits Requested:   1    All questions were answered. The patient knows to call the clinic with any problems, questions or concerns.      Cammie Sickle, MD 03/25/2022 1:13 PM

## 2022-03-25 NOTE — Progress Notes (Signed)
Patient tolerated Keytruda infusion well, no questions/concerns voiced. Patient stable at discharge. Refused AVS .   

## 2022-03-25 NOTE — Progress Notes (Signed)
Has been waking up at night with hand and arm pain rt limited x3 weeks.  Having b/l chest aches and under arms, full body fatigue x2 weeks.

## 2022-03-25 NOTE — Patient Instructions (Signed)
[facility]  Discharge Instructions: Thank you for choosing [facility] to provide your oncology and hematology care.  If you have a lab appointment with the Cancer Center, please go directly to the Cancer Center and check in at the registration area.  Wear comfortable clothing and clothing appropriate for easy access to any Portacath or PICC line.   We strive to give you quality time with your provider. You may need to reschedule your appointment if you arrive late (15 or more minutes).  Arriving late affects you and other patients whose appointments are after yours.  Also, if you miss three or more appointments without notifying the office, you may be dismissed from the clinic at the provider's discretion.      For prescription refill requests, have your pharmacy contact our office and allow 72 hours for refills to be completed.    Today you received the following chemotherapy and/or immunotherapy agents: KEYTRUDA   To help prevent nausea and vomiting after your treatment, we encourage you to take your nausea medication as directed.  BELOW ARE SYMPTOMS THAT SHOULD BE REPORTED IMMEDIATELY: *FEVER GREATER THAN 100.4 F (38 C) OR HIGHER *CHILLS OR SWEATING *NAUSEA AND VOMITING THAT IS NOT CONTROLLED WITH YOUR NAUSEA MEDICATION *UNUSUAL SHORTNESS OF BREATH *UNUSUAL BRUISING OR BLEEDING *URINARY PROBLEMS (pain or burning when urinating, or frequent urination) *BOWEL PROBLEMS (unusual diarrhea, constipation, pain near the anus) TENDERNESS IN MOUTH AND THROAT WITH OR WITHOUT PRESENCE OF ULCERS (sore throat, sores in mouth, or a toothache) UNUSUAL RASH, SWELLING OR PAIN  UNUSUAL VAGINAL DISCHARGE OR ITCHING   Items with * indicate a potential emergency and should be followed up as soon as possible or go to the Emergency Department if any problems should occur.  Please show the CHEMOTHERAPY ALERT CARD or IMMUNOTHERAPY ALERT CARD at check-in to the  Emergency Department and triage nurse.  Should you have questions after your visit or need to cancel or reschedule your appointment, please contact [facility]  [phone] and follow the prompts.  Office hours are 8:00 a.m. to 4:30 p.m. Monday - Friday. Please note that voicemails left after 4:00 p.m. may not be returned until the following business day.  We are closed weekends and major holidays. You have access to a nurse at all times for urgent questions. Please call the main number to the clinic [phone] and follow the prompts.  For any non-urgent questions, you may also contact your provider using MyChart. We now offer e-Visits for anyone 18 and older to request care online for non-urgent symptoms. For details visit [email or web address].   Also download the MyChart app! Go to the app store, search "MyChart", open the app, select , and log in with your MyChart username and password.  Masks are optional in the cancer centers. If you would like for your care team to wear a mask while they are taking care of you, please let them know. For doctor visits, patients may have with them one support person who is at least 68 years old. At this time, visitors are not allowed in the infusion area.   

## 2022-03-26 ENCOUNTER — Ambulatory Visit: Payer: Medicare HMO

## 2022-03-26 ENCOUNTER — Other Ambulatory Visit: Payer: Medicare HMO

## 2022-03-26 ENCOUNTER — Ambulatory Visit: Payer: Medicare HMO | Admitting: Internal Medicine

## 2022-03-28 DIAGNOSIS — E559 Vitamin D deficiency, unspecified: Secondary | ICD-10-CM | POA: Diagnosis not present

## 2022-03-28 DIAGNOSIS — M720 Palmar fascial fibromatosis [Dupuytren]: Secondary | ICD-10-CM | POA: Diagnosis not present

## 2022-03-28 DIAGNOSIS — G629 Polyneuropathy, unspecified: Secondary | ICD-10-CM | POA: Diagnosis not present

## 2022-03-28 DIAGNOSIS — R7309 Other abnormal glucose: Secondary | ICD-10-CM | POA: Diagnosis not present

## 2022-03-28 DIAGNOSIS — C439 Malignant melanoma of skin, unspecified: Secondary | ICD-10-CM | POA: Diagnosis not present

## 2022-03-28 DIAGNOSIS — Z8639 Personal history of other endocrine, nutritional and metabolic disease: Secondary | ICD-10-CM | POA: Diagnosis not present

## 2022-03-28 DIAGNOSIS — C779 Secondary and unspecified malignant neoplasm of lymph node, unspecified: Secondary | ICD-10-CM | POA: Diagnosis not present

## 2022-03-28 DIAGNOSIS — R0683 Snoring: Secondary | ICD-10-CM | POA: Diagnosis not present

## 2022-04-11 ENCOUNTER — Telehealth: Payer: Self-pay

## 2022-04-11 NOTE — Telephone Encounter (Signed)
Patient called to confirm appointments for 7/11 . After speaking with patient, patient stated he was under the impression that at his 3 week follow up he would get labs,md and [place]. 7/11 he is only scheduled for Labs and MD.

## 2022-04-16 ENCOUNTER — Inpatient Hospital Stay: Payer: Medicare HMO

## 2022-04-16 ENCOUNTER — Encounter: Payer: Self-pay | Admitting: Internal Medicine

## 2022-04-16 ENCOUNTER — Inpatient Hospital Stay: Payer: Medicare HMO | Attending: Oncology | Admitting: Internal Medicine

## 2022-04-16 DIAGNOSIS — G4730 Sleep apnea, unspecified: Secondary | ICD-10-CM | POA: Insufficient documentation

## 2022-04-16 DIAGNOSIS — C439 Malignant melanoma of skin, unspecified: Secondary | ICD-10-CM

## 2022-04-16 DIAGNOSIS — Z79899 Other long term (current) drug therapy: Secondary | ICD-10-CM | POA: Diagnosis not present

## 2022-04-16 DIAGNOSIS — M542 Cervicalgia: Secondary | ICD-10-CM | POA: Insufficient documentation

## 2022-04-16 DIAGNOSIS — F419 Anxiety disorder, unspecified: Secondary | ICD-10-CM | POA: Diagnosis not present

## 2022-04-16 DIAGNOSIS — E039 Hypothyroidism, unspecified: Secondary | ICD-10-CM | POA: Diagnosis not present

## 2022-04-16 DIAGNOSIS — R21 Rash and other nonspecific skin eruption: Secondary | ICD-10-CM | POA: Diagnosis not present

## 2022-04-16 DIAGNOSIS — G629 Polyneuropathy, unspecified: Secondary | ICD-10-CM | POA: Insufficient documentation

## 2022-04-16 DIAGNOSIS — Z79890 Hormone replacement therapy (postmenopausal): Secondary | ICD-10-CM | POA: Insufficient documentation

## 2022-04-16 DIAGNOSIS — C4359 Malignant melanoma of other part of trunk: Secondary | ICD-10-CM | POA: Insufficient documentation

## 2022-04-16 DIAGNOSIS — C773 Secondary and unspecified malignant neoplasm of axilla and upper limb lymph nodes: Secondary | ICD-10-CM | POA: Insufficient documentation

## 2022-04-16 DIAGNOSIS — Z801 Family history of malignant neoplasm of trachea, bronchus and lung: Secondary | ICD-10-CM | POA: Diagnosis not present

## 2022-04-16 DIAGNOSIS — R110 Nausea: Secondary | ICD-10-CM | POA: Diagnosis not present

## 2022-04-16 DIAGNOSIS — E860 Dehydration: Secondary | ICD-10-CM | POA: Insufficient documentation

## 2022-04-16 DIAGNOSIS — C779 Secondary and unspecified malignant neoplasm of lymph node, unspecified: Secondary | ICD-10-CM

## 2022-04-16 DIAGNOSIS — R69 Illness, unspecified: Secondary | ICD-10-CM | POA: Diagnosis not present

## 2022-04-16 LAB — COMPREHENSIVE METABOLIC PANEL
ALT: 44 U/L (ref 0–44)
AST: 46 U/L — ABNORMAL HIGH (ref 15–41)
Albumin: 3.4 g/dL — ABNORMAL LOW (ref 3.5–5.0)
Alkaline Phosphatase: 62 U/L (ref 38–126)
Anion gap: 10 (ref 5–15)
BUN: 18 mg/dL (ref 8–23)
CO2: 23 mmol/L (ref 22–32)
Calcium: 9.5 mg/dL (ref 8.9–10.3)
Chloride: 104 mmol/L (ref 98–111)
Creatinine, Ser: 1.26 mg/dL — ABNORMAL HIGH (ref 0.61–1.24)
GFR, Estimated: >60 mL/min (ref >60)
Glucose, Bld: 124 mg/dL — ABNORMAL HIGH (ref 70–99)
Potassium: 3.9 mmol/L (ref 3.5–5.1)
Sodium: 137 mmol/L (ref 135–145)
Total Bilirubin: 1 mg/dL (ref 0.3–1.2)
Total Protein: 5.8 g/dL — ABNORMAL LOW (ref 6.5–8.1)

## 2022-04-16 LAB — CBC WITH DIFFERENTIAL/PLATELET
Abs Immature Granulocytes: 0.02 10*3/uL (ref 0.00–0.07)
Basophils Absolute: 0.1 10*3/uL (ref 0.0–0.1)
Basophils Relative: 1 %
Eosinophils Absolute: 0.5 10*3/uL (ref 0.0–0.5)
Eosinophils Relative: 10 %
HCT: 36.3 % — ABNORMAL LOW (ref 39.0–52.0)
Hemoglobin: 12.6 g/dL — ABNORMAL LOW (ref 13.0–17.0)
Immature Granulocytes: 0 %
Lymphocytes Relative: 38 %
Lymphs Abs: 2 10*3/uL (ref 0.7–4.0)
MCH: 32 pg (ref 26.0–34.0)
MCHC: 34.7 g/dL (ref 30.0–36.0)
MCV: 92.1 fL (ref 80.0–100.0)
Monocytes Absolute: 0.6 10*3/uL (ref 0.1–1.0)
Monocytes Relative: 10 %
Neutro Abs: 2.1 10*3/uL (ref 1.7–7.7)
Neutrophils Relative %: 41 %
Platelets: 200 10*3/uL (ref 150–400)
RBC: 3.94 MIL/uL — ABNORMAL LOW (ref 4.22–5.81)
RDW: 12.1 % (ref 11.5–15.5)
WBC: 5.3 10*3/uL (ref 4.0–10.5)
nRBC: 0 % (ref 0.0–0.2)

## 2022-04-16 LAB — CORTISOL: Cortisol, Plasma: 1.3 ug/dL

## 2022-04-16 MED ORDER — VALACYCLOVIR HCL 1 G PO TABS: 1000.0000 mg | ORAL_TABLET | Freq: Two times a day (BID) | ORAL | 0 refills | Status: DC

## 2022-04-16 NOTE — Progress Notes (Signed)
Patient having intermittent neuropathy pain in hands with no discomfort for the fast few days.  Decrease in appetite with 7 lb wt loss, has not met with dietician.    BP check today 92/65, HR 84.  Increase in fatigue with feeling of dizzy this morning.

## 2022-04-16 NOTE — Progress Notes (Signed)
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   There is sufficient material for ancillary molecular testing if needed  (block A3, A1, A2).   Comment:  Per provided outside pathology report the patient had an "at least pT1b"  malignant melanoma of the left lateral infraclavicular skin in 2015.  Touch preparations demonstrate predominantly discohesive large  pleomorphic cells, some with binucleation.  HE sections demonstrate  lymph node parenchyma involved by metastatic neoplasm.  The neoplastic  cells are pleomorphic with binucleation, focal intranuclear inclusions,  and associated tumor necrosis. The neoplastic cells demonstrate the  following pattern of immunoreactivity:  S100: Positive  SOX10: Positive  HMB-45: Negative  Melan-A: Negative  Super pancytokeratin: Negative  CD45: Negative  This pattern of immunoreactivity supports the above diagnosis.   # NOV 7th, 2022-neoadjuvant Keytruda cycle #1  # JAN, 26th, [name],  majority of these foci measure 1-2 mm in greatest dimension, with the largest measuring 4 mm.  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  Accompanied by his wife.  [name] 66 y.o.  male pleasant patient above history of recurrent melanoma left axillary lymphadenopathy-currently on adjuvant [name] is here for follow-up.  Patient is also on Synthroid for his iatrogenic hypothyroidism.  Patient continues to complains of tingling and numbness of his upper extremities right more than left. And pain in Right UE worse in night/AM.  S/p evaluation with neurology.  Complains of fatigue.  Denies any headaches.  No shortness of breath or cough.  Appetite is fair.   No weight loss no nausea vomiting.  Noted to have a rash body.  Review of Systems  Constitutional:  Negative for chills, diaphoresis, fever, malaise/fatigue and weight loss.  HENT:  Negative for nosebleeds and sore throat.   Eyes:  Negative for double vision.  Respiratory:  Negative for hemoptysis, sputum production, shortness of breath and wheezing.   Cardiovascular:  Negative for chest pain, palpitations, orthopnea and leg swelling.  Gastrointestinal:  Negative for abdominal pain, blood in stool, constipation, diarrhea, heartburn, melena, nausea and vomiting.  Genitourinary:  Negative for dysuria, frequency and urgency.  Musculoskeletal:  Positive for myalgias. Negative for back pain and joint pain.  Skin: Negative.  Negative for itching and rash.  Neurological:  Positive for tingling. Negative for dizziness, focal weakness and weakness.  Endo/Heme/Allergies:  Does not bruise/bleed  easily.  Psychiatric/Behavioral:  Negative for depression. The patient is not nervous/anxious and does not have insomnia.       PAST MEDICAL HISTORY :  Past Medical History:   Diagnosis Date   Basal cell carcinoma 03/10/2013   Right medial forehead. Excised, margins free.   Dysplastic nevus 11/05/2006   Mid back paraspinal, [name], close to edge. Excised 12/24/2006, margins free.   Dysplastic nevus 12/15/2008   Left lat. pectoral area. Moderate atypia, extends to one edge.    Dysplastic nevus 12/07/2009   Left mid side. Moderate atypia, close to margin.   Dysplastic nevus 12/07/2009   Right posterior waistline. Moderate atypia, close to margin.   Dysplastic nevus 02/10/2014   Left paraspinal mid back. Moderate atypia, lateral and deep margin involved.    Dysplastic nevus 06/16/2014   Left epigastric. Mild atypia, deep margin involved.    GERD (gastroesophageal reflux disease)    Hx of basal cell carcinoma    multiple sites   Hx of malignant melanoma 11/11/2013   L lateral infraclavicular, superficial spreading, Breslow's 0.15m, Clark's level III   Hypothyroidism    [place]   Left lateral infraclavicular. MM, superficial spreading. Anatomic level III. Tumor thickness 0.637m Excised 11/24/2013, margins free.     PAST SURGICAL HISTORY :   Past Surgical History:  Procedure Laterality Date   AXILLARY LYMPH NODE DISSECTION Left 11/02/2021   Procedure: AXILLARY LYMPH NODE DISSECTION;  [name];  [name];  Service: General;  Laterality: Left;   COLONOSCOPY WITH PROPOFOL N/A 01/20/2017   Procedure: COLONOSCOPY WITH PROPOFOL;  [name];  [facility];  Service: Endoscopy;  [name];   ESOPHAGOGASTRODUODENOSCOPY (EGD) WITH ESOPHAGEAL DILATION     MELANOMA EXCISION Right 11/24/2013   lateral infraclavicular   WISDOM TOOTH EXTRACTION     WRIST SURGERY Left 2022    FAMILY HISTORY :   Family History  Problem Relation Age of Onset   Macular degeneration Mother    Hyperlipidemia Mother    Heart disease Father    Heart attack Father     Lung cancer Father        metastasized    SOCIAL HISTORY:   Social History   Tobacco Use   Smoking status: Never   Smokeless tobacco: Never  Vaping Use   Vaping Use: Never used  Substance Use Topics   Alcohol use: Yes    Comment: about 1-3 glass of wine with dinner   Drug use: No    ALLERGIES:  has No Known Allergies.  MEDICATIONS:  Current Outpatient Medications  Medication Sig Dispense Refill   aspirin 81 MG tablet Take 81 mg by mouth daily.     levothyroxine (SYNTHROID) 100 MCG tablet Take 1 tablet (100 mcg total) by mouth daily. 30 tablet 3   Multiple Vitamins tablet Take 1 tablet by mouth daily.     Multiple Vitamins-Minerals (PRESERVISION AREDS 2 PO) Take 1 capsule by mouth in the morning and at bedtime.     pantoprazole (PROTONIX) 20 MG tablet Take 20 mg by mouth daily.  0   Pembrolizumab (KEYTRUDA IV) Inject 1 Dose into the vein every 21 ( twenty-one) days.     valACYclovir (VALTREX) 1000 MG tablet Take 1 tablet (1,000 mg total) by mouth 2 (two) times daily. 10 tablet 0   fluorouracil (EFUDEX) 5 % cream Apply to scalp twice a day for 7 days, (Patient not taking:  Reported on 03/25/2022) 40 g 1   No current facility-administered medications for this visit.    PHYSICAL EXAMINATION: ECOG PERFORMANCE STATUS: 0 - Asymptomatic  BP 92/65 (BP Location: Right Arm, Patient Position: Sitting)   Pulse 84   Temp 98.1 F (36.7 C) (Tympanic)   Resp 16   Ht '6\' 1"'$  (1.854 m)   Wt 190 lb 3.2 oz (86.3 kg)   SpO2 99%   BMI 25.09 kg/m   Filed Weights   04/16/22 0900  Weight: 190 lb 3.2 oz (86.3 kg)     Positive for lymph node left underarm approximate 2 to 3 cm in size.  Physical Exam Vitals and nursing note reviewed.  HENT:     Head: Normocephalic and atraumatic.     Mouth/Throat:     Pharynx: Oropharynx is clear.  Eyes:     Extraocular Movements: Extraocular movements intact.     Pupils: Pupils are equal, round, and reactive to light.  Cardiovascular:     Rate  and Rhythm: Normal rate and regular rhythm.  Pulmonary:     Comments: Decreased breath sounds bilaterally.  Abdominal:     Palpations: Abdomen is soft.  Musculoskeletal:        General: Normal range of motion.     Cervical back: Normal range of motion.  Skin:    General: Skin is warm.  Neurological:     General: No focal deficit present.     Mental Status: He is alert and oriented to person, place, and time.  Psychiatric:        Behavior: Behavior normal.        Judgment: Judgment normal.        LABORATORY DATA:  I have reviewed the data as listed    Component Value Date/Time   NA 137 04/16/2022 0859   NA 145 (H) 06/26/2021 1416   K 3.9 04/16/2022 0859   CL 104 04/16/2022 0859   CO2 23 04/16/2022 0859   GLUCOSE 124 (H) 04/16/2022 0859   BUN 18 04/16/2022 0859   BUN 16 06/26/2021 1416   CREATININE 1.26 (H) 04/16/2022 0859   CALCIUM 9.5 04/16/2022 0859   PROT 5.8 (L) 04/16/2022 0859   PROT 6.7 06/26/2021 1416   ALBUMIN 3.4 (L) 04/16/2022 0859   ALBUMIN 4.6 06/26/2021 1416   AST 46 (H) 04/16/2022 0859   ALT 44 04/16/2022 0859   ALKPHOS 62 04/16/2022 0859   BILITOT 1.0 04/16/2022 0859   BILITOT 0.4 06/26/2021 1416   GFRNONAA >60 04/16/2022 0859   GFRAA 75 05/10/2020 1017    No results found for: "SPEP", "UPEP"  Lab Results  Component Value Date   WBC 5.3 04/16/2022   NEUTROABS 2.1 04/16/2022   HGB 12.6 (L) 04/16/2022   HCT 36.3 (L) 04/16/2022   MCV 92.1 04/16/2022   PLT 200 04/16/2022      Chemistry      Component Value Date/Time   NA 137 04/16/2022 0859   NA 145 (H) 06/26/2021 1416   K 3.9 04/16/2022 0859   CL 104 04/16/2022 0859   CO2 23 04/16/2022 0859   BUN 18 04/16/2022 0859   BUN 16 06/26/2021 1416   CREATININE 1.26 (H) 04/16/2022 0859   GLU 96 10/19/2014 0000      Component Value Date/Time   CALCIUM 9.5 04/16/2022 0859   ALKPHOS 62 04/16/2022 0859   AST 46 (H) 04/16/2022 0859   ALT 44 04/16/2022 0859   BILITOT 1.0 04/16/2022 0859    BILITOT 0.4  06/26/2021 1416       RADIOGRAPHIC STUDIES: I have personally reviewed the radiological images as listed and agreed with the findings in the report. No results found.   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  S/p neoadjuvant Keytruda x4- -Status post axillary lymph node dissection-with residual micrometastatic disease.  Reviewed the note from [place] at [facility] role for radiation.   # HOLD keytruda #10.  today of planned total 18. Labs today reviewed;  acceptable for treatment today.   # Upper Extremity numbness/pain  [Right > Left] in AM- s/p evaluation with Dr.Shah [neurology-JUNE 2023]. Awaiting NCS.  cervical spine MRI with and without contrast- degenerative; no evidence of cancer.   # LFTs- Mild - likely to [place]. Monitor closley.   #Iatrogenic hypothyroidism [April 2023; TSH- 44]-on Synthroid 100 mcg once a day.  JUNE 2023 TSH 8 Awaiting labs today.  # Left shoulder rash: ? shingles vs- dermatitis from [facility]. Plan valacyclovir 1 mg BID x 5 days; ok with hydrocortisone.   # ?  Obstructive sleep apnea-awaiting evaluation with sleep study/home [neurology]  * Monday pref # DISPOSITION:   # HOLD keytruda today # follow up in 3 weeks- MD; labs- cbc/cmp;Keytruda;  Dr.B  Addendum: Patient informed given abdominal discomfort/nausea-discontinued Valtrex.       No orders of the defined types were placed in this encounter.   All questions were answered. The patient knows to call the clinic with any problems, questions or concerns.      Cammie Sickle, MD 04/24/2022 11:27 PM

## 2022-04-16 NOTE — Assessment & Plan Note (Addendum)
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  S/p neoadjuvant Keytruda x4- -Status post axillary lymph node dissection-with residual micrometastatic disease.  Reviewed the note from [place] at [facility] role for radiation.   # HOLD keytruda #10.  today of planned total 18. Labs today reviewed;  acceptable for treatment today.   # Upper Extremity numbness/pain  [Right > Left] in AM- s/p evaluation with Dr.Shah [neurology-JUNE 2023]. Awaiting NCS.  cervical spine MRI with and without contrast- degenerative; no evidence of cancer.   # LFTs- Mild - likely to [place]. Monitor closley.   #Iatrogenic hypothyroidism [April 2023; TSH- 44]-on Synthroid 100 mcg once a day.  JUNE 2023 TSH 8 Awaiting labs today.  # Left shoulder rash: ? shingles vs- dermatitis from [facility]. Plan valacyclovir 1 mg BID x 5 days; ok with hydrocortisone.   # ?  Obstructive sleep apnea-awaiting evaluation with sleep study/home [neurology]  * Monday pref # DISPOSITION:   # HOLD keytruda today # follow up in 3 weeks- MD; labs- cbc/cmp;Keytruda;  Dr.B  Addendum: Patient informed given abdominal discomfort/nausea-discontinued Valtrex.

## 2022-04-17 DIAGNOSIS — M9901 Segmental and somatic dysfunction of cervical region: Secondary | ICD-10-CM | POA: Diagnosis not present

## 2022-04-17 DIAGNOSIS — M62830 Muscle spasm of back: Secondary | ICD-10-CM | POA: Diagnosis not present

## 2022-04-17 DIAGNOSIS — M5033 Other cervical disc degeneration, cervicothoracic region: Secondary | ICD-10-CM | POA: Diagnosis not present

## 2022-04-17 DIAGNOSIS — M9903 Segmental and somatic dysfunction of lumbar region: Secondary | ICD-10-CM | POA: Diagnosis not present

## 2022-04-17 LAB — THYROID PANEL WITH TSH
Free Thyroxine Index: 2.3 (ref 1.2–4.9)
T3 Uptake Ratio: 28 % (ref 24–39)
T4, Total: 8.1 ug/dL (ref 4.5–12.0)
TSH: 2.61 u[IU]/mL (ref 0.450–4.500)

## 2022-04-19 ENCOUNTER — Encounter: Payer: Self-pay | Admitting: Internal Medicine

## 2022-04-19 DIAGNOSIS — M62830 Muscle spasm of back: Secondary | ICD-10-CM | POA: Diagnosis not present

## 2022-04-19 DIAGNOSIS — M9903 Segmental and somatic dysfunction of lumbar region: Secondary | ICD-10-CM | POA: Diagnosis not present

## 2022-04-19 DIAGNOSIS — M9901 Segmental and somatic dysfunction of cervical region: Secondary | ICD-10-CM | POA: Diagnosis not present

## 2022-04-19 DIAGNOSIS — M5033 Other cervical disc degeneration, cervicothoracic region: Secondary | ICD-10-CM | POA: Diagnosis not present

## 2022-04-20 DIAGNOSIS — G4733 Obstructive sleep apnea (adult) (pediatric): Secondary | ICD-10-CM | POA: Diagnosis not present

## 2022-04-22 DIAGNOSIS — G4733 Obstructive sleep apnea (adult) (pediatric): Secondary | ICD-10-CM | POA: Diagnosis not present

## 2022-04-23 DIAGNOSIS — G4733 Obstructive sleep apnea (adult) (pediatric): Secondary | ICD-10-CM | POA: Insufficient documentation

## 2022-04-24 ENCOUNTER — Encounter: Payer: Self-pay | Admitting: Internal Medicine

## 2022-04-25 ENCOUNTER — Other Ambulatory Visit: Payer: Self-pay | Admitting: Internal Medicine

## 2022-04-25 ENCOUNTER — Inpatient Hospital Stay: Payer: Medicare HMO

## 2022-04-25 ENCOUNTER — Inpatient Hospital Stay (HOSPITAL_BASED_OUTPATIENT_CLINIC_OR_DEPARTMENT_OTHER): Payer: Medicare HMO | Admitting: Oncology

## 2022-04-25 ENCOUNTER — Encounter: Payer: Self-pay | Admitting: Oncology

## 2022-04-25 ENCOUNTER — Other Ambulatory Visit: Payer: Self-pay | Admitting: Oncology

## 2022-04-25 ENCOUNTER — Telehealth: Payer: Self-pay | Admitting: Internal Medicine

## 2022-04-25 VITALS — BP 130/69 | HR 83 | Temp 96.2°F | Resp 16 | Ht 73.0 in | Wt 189.5 lb

## 2022-04-25 DIAGNOSIS — G629 Polyneuropathy, unspecified: Secondary | ICD-10-CM | POA: Diagnosis not present

## 2022-04-25 DIAGNOSIS — C4359 Malignant melanoma of other part of trunk: Secondary | ICD-10-CM | POA: Diagnosis not present

## 2022-04-25 DIAGNOSIS — E039 Hypothyroidism, unspecified: Secondary | ICD-10-CM | POA: Diagnosis not present

## 2022-04-25 DIAGNOSIS — R69 Illness, unspecified: Secondary | ICD-10-CM | POA: Diagnosis not present

## 2022-04-25 DIAGNOSIS — G4730 Sleep apnea, unspecified: Secondary | ICD-10-CM | POA: Diagnosis not present

## 2022-04-25 DIAGNOSIS — Z79890 Hormone replacement therapy (postmenopausal): Secondary | ICD-10-CM | POA: Diagnosis not present

## 2022-04-25 DIAGNOSIS — C779 Secondary and unspecified malignant neoplasm of lymph node, unspecified: Secondary | ICD-10-CM

## 2022-04-25 DIAGNOSIS — E860 Dehydration: Secondary | ICD-10-CM | POA: Diagnosis not present

## 2022-04-25 DIAGNOSIS — R110 Nausea: Secondary | ICD-10-CM | POA: Diagnosis not present

## 2022-04-25 DIAGNOSIS — R530 Neoplastic (malignant) related fatigue: Secondary | ICD-10-CM

## 2022-04-25 DIAGNOSIS — Z79899 Other long term (current) drug therapy: Secondary | ICD-10-CM | POA: Diagnosis not present

## 2022-04-25 DIAGNOSIS — R21 Rash and other nonspecific skin eruption: Secondary | ICD-10-CM | POA: Diagnosis not present

## 2022-04-25 DIAGNOSIS — Z801 Family history of malignant neoplasm of trachea, bronchus and lung: Secondary | ICD-10-CM | POA: Diagnosis not present

## 2022-04-25 DIAGNOSIS — R5383 Other fatigue: Secondary | ICD-10-CM

## 2022-04-25 DIAGNOSIS — C773 Secondary and unspecified malignant neoplasm of axilla and upper limb lymph nodes: Secondary | ICD-10-CM | POA: Diagnosis not present

## 2022-04-25 DIAGNOSIS — M542 Cervicalgia: Secondary | ICD-10-CM | POA: Diagnosis not present

## 2022-04-25 LAB — CBC WITH DIFFERENTIAL/PLATELET
Abs Immature Granulocytes: 0.02 10*3/uL (ref 0.00–0.07)
Basophils Absolute: 0.1 10*3/uL (ref 0.0–0.1)
Basophils Relative: 1 %
Eosinophils Absolute: 1.1 10*3/uL — ABNORMAL HIGH (ref 0.0–0.5)
Eosinophils Relative: 21 %
HCT: 35 % — ABNORMAL LOW (ref 39.0–52.0)
Hemoglobin: 11.9 g/dL — ABNORMAL LOW (ref 13.0–17.0)
Immature Granulocytes: 0 %
Lymphocytes Relative: 36 %
Lymphs Abs: 1.9 10*3/uL (ref 0.7–4.0)
MCH: 31 pg (ref 26.0–34.0)
MCHC: 34 g/dL (ref 30.0–36.0)
MCV: 91.1 fL (ref 80.0–100.0)
Monocytes Absolute: 0.5 10*3/uL (ref 0.1–1.0)
Monocytes Relative: 9 %
Neutro Abs: 1.7 10*3/uL (ref 1.7–7.7)
Neutrophils Relative %: 33 %
Platelets: 258 10*3/uL (ref 150–400)
RBC: 3.84 MIL/uL — ABNORMAL LOW (ref 4.22–5.81)
RDW: 12.1 % (ref 11.5–15.5)
WBC: 5.1 10*3/uL (ref 4.0–10.5)
nRBC: 0 % (ref 0.0–0.2)

## 2022-04-25 LAB — COMPREHENSIVE METABOLIC PANEL
ALT: 37 U/L (ref 0–44)
AST: 44 U/L — ABNORMAL HIGH (ref 15–41)
Albumin: 3.2 g/dL — ABNORMAL LOW (ref 3.5–5.0)
Alkaline Phosphatase: 85 U/L (ref 38–126)
Anion gap: 5 (ref 5–15)
BUN: 16 mg/dL (ref 8–23)
CO2: 22 mmol/L (ref 22–32)
Calcium: 8.8 mg/dL — ABNORMAL LOW (ref 8.9–10.3)
Chloride: 106 mmol/L (ref 98–111)
Creatinine, Ser: 1.1 mg/dL (ref 0.61–1.24)
GFR, Estimated: >60 mL/min (ref >60)
Glucose, Bld: 107 mg/dL — ABNORMAL HIGH (ref 70–99)
Potassium: 3.7 mmol/L (ref 3.5–5.1)
Sodium: 133 mmol/L — ABNORMAL LOW (ref 135–145)
Total Bilirubin: 0.5 mg/dL (ref 0.3–1.2)
Total Protein: 5.6 g/dL — ABNORMAL LOW (ref 6.5–8.1)

## 2022-04-25 LAB — LACTATE DEHYDROGENASE: LDH: 143 U/L (ref 98–192)

## 2022-04-25 MED ORDER — PREDNISONE 20 MG PO TABS: 40.0000 mg | ORAL_TABLET | Freq: Every day | ORAL | 0 refills | Status: DC

## 2022-04-25 MED ORDER — SODIUM CHLORIDE 0.9 % IV SOLN
INTRAVENOUS | Status: DC
  Filled 2022-04-25 (×2): qty 250

## 2022-04-25 MED ORDER — MIRTAZAPINE 7.5 MG PO TABS: 7.5000 mg | ORAL_TABLET | Freq: Every day | ORAL | 2 refills | Status: DC

## 2022-04-25 NOTE — Telephone Encounter (Signed)
Patient has been scheduled for lab/Jenny/[facility] starting at 1:45 today.  Lab orders entered.

## 2022-04-25 NOTE — Addendum Note (Signed)
Addended by: Vanice Sarah on: 04/25/2022 09:27 AM   Modules accepted: Orders

## 2022-04-25 NOTE — Telephone Encounter (Signed)
I called patient's wife regarding her concerns as per the message.  Unable to reach left a voicemail that patient needs to be seen in symptom management clinic today.  Please have the patient come for- [facility] today;NP CBC CMP; LDH; thyroid profile; 1 L of IV fluids over 1 hour.   Thanks, GB

## 2022-04-25 NOTE — Progress Notes (Signed)
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   There is sufficient material for ancillary molecular testing if needed  (block A3, A1, A2).   Comment:  Per provided outside pathology report the patient had an "at least pT1b"  malignant melanoma of the left lateral infraclavicular skin in 2015.  Touch preparations demonstrate predominantly discohesive large  pleomorphic cells, some with binucleation.  HE sections demonstrate  lymph node parenchyma involved by metastatic neoplasm.  The neoplastic  cells are pleomorphic with binucleation, focal intranuclear inclusions,  and associated tumor necrosis. The neoplastic cells demonstrate the  following pattern of immunoreactivity:  S100: Positive  SOX10: Positive  HMB-45: Negative  Melan-A: Negative  Super pancytokeratin: Negative  CD45: Negative  This pattern of immunoreactivity supports the above diagnosis.   # NOV 7th, 2022-neoadjuvant Keytruda cycle #1  # JAN, 26th, [name],  majority of these foci measure 1-2 mm in greatest dimension, with the largest measuring 4 mm.  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  male who presents to clinic today for concerns of fatigue, weakness, lack of energy, orthostasis, shortness of breath and no appetite.  He has been receiving Keytruda every 3 weeks since January and his last dose was on 04/15/2022.  Has had several complications due to elevated TSH and was started on Synthroid 100 mcg.  He has been having on and off tingling and numbness in upper extremities and has been evaluated by neurology.  Wife states he has been losing weight due to having no taste for anything.  Will be meeting with our dietitian next week.  Has chronic low back pain but has not been able to see his chiropractor in over a month.  Typically requires monthly adjustments.    Review of Systems  Constitutional:  Positive for malaise/fatigue and weight loss.  Respiratory:  Positive for shortness of breath.   Musculoskeletal:  Positive for back pain and myalgias.  Neurological:  Positive for dizziness and weakness.  Psychiatric/Behavioral:  The patient is nervous/anxious and has insomnia.       PAST MEDICAL HISTORY :  Past Medical History:  Diagnosis Date   Basal cell carcinoma 03/10/2013   Right medial forehead. Excised, margins free.   Dysplastic nevus 11/05/2006   Mid back paraspinal, [name], close to edge.  Excised 12/24/2006, margins free.   Dysplastic nevus 12/15/2008   Left lat. pectoral area. Moderate atypia, extends to one edge.    Dysplastic nevus 12/07/2009   Left mid side. Moderate atypia, close to margin.   Dysplastic nevus 12/07/2009   Right posterior waistline. Moderate atypia, close to margin.   Dysplastic nevus 02/10/2014   Left  paraspinal mid back. Moderate atypia, lateral and deep margin involved.    Dysplastic nevus 06/16/2014   Left epigastric. Mild atypia, deep margin involved.    GERD (gastroesophageal reflux disease)    Hx of basal cell carcinoma    multiple sites   Hx of malignant melanoma 11/11/2013   L lateral infraclavicular, superficial spreading, Breslow's 0.71m, Clark's level III   Hypothyroidism    [place]   Left lateral infraclavicular. MM, superficial spreading. Anatomic level III. Tumor thickness 0.615m Excised 11/24/2013, margins free.     PAST SURGICAL HISTORY :   Past Surgical History:  Procedure Laterality Date   AXILLARY LYMPH NODE DISSECTION Left 11/02/2021   Procedure: AXILLARY LYMPH NODE DISSECTION;  [name];  [name];  Service: General;  Laterality: Left;   COLONOSCOPY WITH PROPOFOL N/A 01/20/2017   Procedure: COLONOSCOPY WITH PROPOFOL;  [name];  [facility];  Service: Endoscopy;  [name];   ESOPHAGOGASTRODUODENOSCOPY (EGD) WITH ESOPHAGEAL DILATION     MELANOMA EXCISION Right 11/24/2013   lateral infraclavicular   WISDOM TOOTH EXTRACTION     WRIST SURGERY Left 2022    FAMILY HISTORY :   Family History  Problem Relation Age of Onset   Macular degeneration Mother    Hyperlipidemia Mother    Heart disease Father    Heart attack Father    Lung cancer Father        metastasized    SOCIAL HISTORY:   Social History   Tobacco Use   Smoking status: Never   Smokeless tobacco: Never  Vaping Use   Vaping Use: Never used  Substance Use Topics   Alcohol use: Yes    Comment: about 1-3 glass of wine with dinner   Drug use: No    ALLERGIES:  has No Known Allergies.  MEDICATIONS:  Current Outpatient Medications  Medication Sig Dispense Refill   aspirin 81 MG tablet Take 81 mg by mouth daily.     fluorouracil (EFUDEX) 5 % cream Apply to scalp twice a day for 7 days, (Patient not  taking: Reported on 03/25/2022) 40 g 1   levothyroxine (SYNTHROID) 100 MCG tablet Take 1 tablet (100 mcg total) by mouth daily. 30 tablet 3   Multiple Vitamins tablet Take 1 tablet by mouth daily.     Multiple Vitamins-Minerals (PRESERVISION AREDS 2 PO) Take 1 capsule by mouth in the morning and at bedtime.     pantoprazole (PROTONIX) 20 MG tablet Take 20 mg by mouth daily.  0   Pembrolizumab (KEYTRUDA IV) Inject 1 Dose into the vein every 21 ( twenty-one) days.     valACYclovir (VALTREX) 1000 MG tablet Take 1 tablet (1,000 mg total) by mouth 2 (two) times daily. 10 tablet 0   No current facility-administered medications for this visit.    PHYSICAL EXAMINATION: ECOG PERFORMANCE STATUS: 0 - Asymptomatic  There were no vitals taken for this visit.  There were no vitals filed for this visit.   Physical Exam Constitutional:      Appearance: Normal appearance.  HENT:     Head: Normocephalic and atraumatic.  Eyes:     Pupils: Pupils are equal, round, and reactive to light.  Cardiovascular:     Rate and Rhythm: Normal rate and regular rhythm.     Heart sounds: Normal heart sounds. No murmur heard. Pulmonary:     Effort: Pulmonary effort is normal.     Breath sounds: Normal breath sounds. No wheezing.  Abdominal:     General: Bowel sounds are normal. There is no distension.     Palpations: Abdomen is soft.     Tenderness: There is no abdominal tenderness.  Musculoskeletal:        General: Normal range of motion.     Cervical back: Normal range of motion.  Skin:    General: Skin is warm and dry.     Findings: No rash.  Neurological:     Mental Status: He is alert and oriented to person, place, and time.     Gait: Gait is intact.  Psychiatric:        Mood and Affect: Mood and affect normal.        Cognition and Memory: Memory normal.        Judgment: Judgment normal.        LABORATORY DATA:  I have reviewed the data as listed    Component Value Date/Time   NA 137  04/16/2022 0859   NA 145 (H) 06/26/2021 1416   K 3.9 04/16/2022 0859   CL 104 04/16/2022 0859   CO2 23 04/16/2022 0859   GLUCOSE 124 (H) 04/16/2022 0859   BUN 18 04/16/2022 0859   BUN 16 06/26/2021 1416   CREATININE 1.26 (H) 04/16/2022 0859   CALCIUM 9.5 04/16/2022 0859   PROT 5.8 (L) 04/16/2022 0859   PROT 6.7 06/26/2021 1416   ALBUMIN 3.4 (L) 04/16/2022 0859   ALBUMIN 4.6 06/26/2021 1416   AST 46 (H) 04/16/2022 0859   ALT 44 04/16/2022 0859   ALKPHOS 62 04/16/2022 0859   BILITOT 1.0 04/16/2022 0859   BILITOT 0.4 06/26/2021 1416   GFRNONAA >60 04/16/2022 0859   GFRAA 75 05/10/2020 1017    No results found for: "SPEP", "UPEP"  Lab Results  Component Value Date   WBC 5.3 04/16/2022   NEUTROABS 2.1 04/16/2022   HGB 12.6 (L) 04/16/2022   HCT 36.3 (L) 04/16/2022   MCV 92.1 04/16/2022   PLT 200 04/16/2022      Chemistry      Component Value Date/Time   NA 137 04/16/2022 0859   NA 145 (H) 06/26/2021 1416   K 3.9 04/16/2022 0859   CL 104 04/16/2022 0859   CO2 23 04/16/2022 0859   BUN 18 04/16/2022 0859   BUN 16 06/26/2021 1416   CREATININE 1.26 (H) 04/16/2022 0859   GLU 96 10/19/2014 0000      Component Value Date/Time   CALCIUM 9.5 04/16/2022 0859   ALKPHOS 62 04/16/2022 0859   AST 46 (H) 04/16/2022 0859   ALT 44 04/16/2022 0859   BILITOT 1.0 04/16/2022 0859   BILITOT 0.4 06/26/2021 1416       RADIOGRAPHIC STUDIES: I have personally reviewed the radiological images as listed and agreed with the findings in the report. No results found.   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  Last treatment was held secondary to all over body rash. Had axillary lymph node dissection with residual micrometastatic disease.  Case discussed with Dr. Lucretia Field at [facility] and there is no role  for radiation at this time.  Due for next cycle of Keytruda in 1 week.   Weakness/fatigue/dizziness- Discussed with  Dr. Rogue Bussing who is wanting to check cortisol and ACTH level for possible adrenal insufficiency.  We will check first thing in the morning tomorrow.  Dr. Rogue Bussing would like to start him on 40 mg prednisone daily while we wait for results to come back.  He also had a sleep study which showed severe sleep apnea and he has been referred to cardiology which likely is also contributing to his fatigue and sleepiness during the day.  Hypothyroidism- Secondary to immunotherapy Keytruda.  Currently on Synthroid 100 mcg/day.  TSH has now improved and is normalized.  Anxiety/Decrease in appetite- Discussed starting Remeron 7.5 mg at bedtime to help with anxiety/depression.  He is also having decrease in his appetite and we talked about starting this medication to help stimulate. He will be meeting with our dietitian next week.  I have asked that he wait to start this until after we get his cortisol/ACTH levels back.   Disposition- Start 40 mg prednisone tomorrow morning.  Hold off on starting Remeron. Return to clinic in the morning at 815 for lab work.  We will call with results. RTC as scheduled to see Dr. Rogue Bussing on 05/07/2022.  I spent 45 minutes dedicated to the care of this patient (face-to-face and non-face-to-face) on the date of the encounter to include what is described in the assessment and plan.   All questions were answered. The patient knows to call the clinic with any problems, questions or concerns.      Jacquelin Hawking, NP 04/25/2022 1:50 PM

## 2022-04-26 ENCOUNTER — Inpatient Hospital Stay: Payer: Medicare HMO | Admitting: Oncology

## 2022-04-26 ENCOUNTER — Inpatient Hospital Stay: Payer: Medicare HMO

## 2022-04-26 ENCOUNTER — Other Ambulatory Visit: Payer: Medicare HMO

## 2022-04-26 ENCOUNTER — Telehealth: Payer: Self-pay | Admitting: Internal Medicine

## 2022-04-26 VITALS — BP 116/74 | HR 74 | Temp 97.6°F | Resp 16

## 2022-04-26 DIAGNOSIS — R21 Rash and other nonspecific skin eruption: Secondary | ICD-10-CM | POA: Diagnosis not present

## 2022-04-26 DIAGNOSIS — G4730 Sleep apnea, unspecified: Secondary | ICD-10-CM | POA: Diagnosis not present

## 2022-04-26 DIAGNOSIS — E860 Dehydration: Secondary | ICD-10-CM | POA: Diagnosis not present

## 2022-04-26 DIAGNOSIS — C779 Secondary and unspecified malignant neoplasm of lymph node, unspecified: Secondary | ICD-10-CM

## 2022-04-26 DIAGNOSIS — R69 Illness, unspecified: Secondary | ICD-10-CM | POA: Diagnosis not present

## 2022-04-26 DIAGNOSIS — C4359 Malignant melanoma of other part of trunk: Secondary | ICD-10-CM | POA: Diagnosis not present

## 2022-04-26 DIAGNOSIS — R110 Nausea: Secondary | ICD-10-CM | POA: Diagnosis not present

## 2022-04-26 DIAGNOSIS — E039 Hypothyroidism, unspecified: Secondary | ICD-10-CM | POA: Diagnosis not present

## 2022-04-26 DIAGNOSIS — C773 Secondary and unspecified malignant neoplasm of axilla and upper limb lymph nodes: Secondary | ICD-10-CM | POA: Diagnosis not present

## 2022-04-26 DIAGNOSIS — R5383 Other fatigue: Secondary | ICD-10-CM

## 2022-04-26 DIAGNOSIS — Z79899 Other long term (current) drug therapy: Secondary | ICD-10-CM | POA: Diagnosis not present

## 2022-04-26 DIAGNOSIS — M542 Cervicalgia: Secondary | ICD-10-CM | POA: Diagnosis not present

## 2022-04-26 DIAGNOSIS — Z79890 Hormone replacement therapy (postmenopausal): Secondary | ICD-10-CM | POA: Diagnosis not present

## 2022-04-26 DIAGNOSIS — Z801 Family history of malignant neoplasm of trachea, bronchus and lung: Secondary | ICD-10-CM | POA: Diagnosis not present

## 2022-04-26 DIAGNOSIS — G629 Polyneuropathy, unspecified: Secondary | ICD-10-CM | POA: Diagnosis not present

## 2022-04-26 LAB — THYROID PANEL WITH TSH
Free Thyroxine Index: 1.9 (ref 1.2–4.9)
T3 Uptake Ratio: 26 % (ref 24–39)
T4, Total: 7.4 ug/dL (ref 4.5–12.0)
TSH: 3.43 u[IU]/mL (ref 0.450–4.500)

## 2022-04-26 LAB — CORTISOL: Cortisol, Plasma: 0.8 ug/dL

## 2022-04-26 MED ORDER — DEXAMETHASONE SODIUM PHOSPHATE 10 MG/ML IJ SOLN: 10.0000 mg | Freq: Once | INTRAMUSCULAR | Status: DC

## 2022-04-26 MED ORDER — SODIUM CHLORIDE 0.9 % IV SOLN
Freq: Once | INTRAVENOUS | Status: AC
  Filled 2022-04-26: qty 250

## 2022-04-26 MED ORDER — SODIUM CHLORIDE 0.9 % IV SOLN
10.0000 mg | Freq: Once | INTRAVENOUS | Status: AC
  Administered 2022-04-26: 10 mg via INTRAVENOUS
  Filled 2022-04-26: qty 10

## 2022-04-26 NOTE — Progress Notes (Signed)
IVF's with 10 mg dexamethasone administered today.

## 2022-04-26 NOTE — Addendum Note (Signed)
Addended by: Vanice Sarah on: 04/26/2022 01:54 PM   Modules accepted: Orders

## 2022-04-26 NOTE — Addendum Note (Signed)
Addended by: Vanice Sarah on: 04/26/2022 01:41 PM   Modules accepted: Orders

## 2022-04-26 NOTE — Telephone Encounter (Signed)
Please schedule STAT MRI as recommended by MD.    Please add lab encounter to [facility] appt next week.

## 2022-04-26 NOTE — Telephone Encounter (Signed)
On 7/20-I spoke to patient wife regarding patient ongoing difficulties with multitude of nonspecific complaints [extreme fatigue nausea vision changes poor appetite weight loss].  Discussed that concern for Keytruda induced side effects. ?  Adrenal insufficiency-recommend checking cortisol ACTH as ordered for 7/21 AM.  Also discussed regarding stat MRI to rule out any pituitary hypophysitis.   Recommend IV fluids 1 L on 7/21; also dexamethasone 8 mg IV; and prednisone 40 mg a day.  Patient will be followed in 1 week-to assess steroid taper; labs etc.   Jenny-please follow-up on the above orders. Thanks GB

## 2022-04-28 ENCOUNTER — Ambulatory Visit
Admission: RE | Admit: 2022-04-28 | Discharge: 2022-04-28 | Disposition: A | Discharge status: Home / Self Care | Payer: Medicare HMO | Source: Ambulatory Visit | Attending: Oncology | Admitting: Oncology

## 2022-04-28 DIAGNOSIS — C779 Secondary and unspecified malignant neoplasm of lymph node, unspecified: Secondary | ICD-10-CM | POA: Insufficient documentation

## 2022-04-28 DIAGNOSIS — C439 Malignant melanoma of skin, unspecified: Secondary | ICD-10-CM | POA: Insufficient documentation

## 2022-04-28 DIAGNOSIS — G319 Degenerative disease of nervous system, unspecified: Secondary | ICD-10-CM | POA: Diagnosis not present

## 2022-04-28 MED ORDER — GADOBUTROL 1 MMOL/ML IV SOLN
8.0000 mL | Freq: Once | INTRAVENOUS | Status: AC | PRN
  Administered 2022-04-28: 8 mL via INTRAVENOUS

## 2022-04-29 ENCOUNTER — Other Ambulatory Visit: Payer: Self-pay

## 2022-04-30 LAB — ACTH: C206 ACTH: 16.6 pg/mL (ref 7.2–63.3)

## 2022-05-01 MED FILL — Dexamethasone Sodium Phosphate Inj 100 MG/10ML: INTRAMUSCULAR | Qty: 1 | Status: AC

## 2022-05-02 ENCOUNTER — Inpatient Hospital Stay (HOSPITAL_BASED_OUTPATIENT_CLINIC_OR_DEPARTMENT_OTHER): Payer: Medicare HMO | Admitting: Medical Oncology

## 2022-05-02 ENCOUNTER — Inpatient Hospital Stay: Payer: Medicare HMO

## 2022-05-02 ENCOUNTER — Encounter: Payer: Self-pay | Admitting: Medical Oncology

## 2022-05-02 VITALS — BP 114/66 | HR 75 | Temp 98.7°F | Resp 20 | Wt 188.0 lb

## 2022-05-02 DIAGNOSIS — R110 Nausea: Secondary | ICD-10-CM | POA: Diagnosis not present

## 2022-05-02 DIAGNOSIS — Z79890 Hormone replacement therapy (postmenopausal): Secondary | ICD-10-CM | POA: Diagnosis not present

## 2022-05-02 DIAGNOSIS — E876 Hypokalemia: Secondary | ICD-10-CM

## 2022-05-02 DIAGNOSIS — E860 Dehydration: Secondary | ICD-10-CM | POA: Diagnosis not present

## 2022-05-02 DIAGNOSIS — C4359 Malignant melanoma of other part of trunk: Secondary | ICD-10-CM | POA: Diagnosis not present

## 2022-05-02 DIAGNOSIS — E039 Hypothyroidism, unspecified: Secondary | ICD-10-CM | POA: Diagnosis not present

## 2022-05-02 DIAGNOSIS — R5383 Other fatigue: Secondary | ICD-10-CM | POA: Diagnosis not present

## 2022-05-02 DIAGNOSIS — C439 Malignant melanoma of skin, unspecified: Secondary | ICD-10-CM | POA: Diagnosis not present

## 2022-05-02 DIAGNOSIS — Z801 Family history of malignant neoplasm of trachea, bronchus and lung: Secondary | ICD-10-CM | POA: Diagnosis not present

## 2022-05-02 DIAGNOSIS — Z79899 Other long term (current) drug therapy: Secondary | ICD-10-CM | POA: Diagnosis not present

## 2022-05-02 DIAGNOSIS — M542 Cervicalgia: Secondary | ICD-10-CM

## 2022-05-02 DIAGNOSIS — R69 Illness, unspecified: Secondary | ICD-10-CM | POA: Diagnosis not present

## 2022-05-02 DIAGNOSIS — C779 Secondary and unspecified malignant neoplasm of lymph node, unspecified: Secondary | ICD-10-CM | POA: Diagnosis not present

## 2022-05-02 DIAGNOSIS — R21 Rash and other nonspecific skin eruption: Secondary | ICD-10-CM | POA: Diagnosis not present

## 2022-05-02 DIAGNOSIS — G629 Polyneuropathy, unspecified: Secondary | ICD-10-CM | POA: Diagnosis not present

## 2022-05-02 DIAGNOSIS — G4730 Sleep apnea, unspecified: Secondary | ICD-10-CM | POA: Diagnosis not present

## 2022-05-02 DIAGNOSIS — C773 Secondary and unspecified malignant neoplasm of axilla and upper limb lymph nodes: Secondary | ICD-10-CM | POA: Diagnosis not present

## 2022-05-02 LAB — COMPREHENSIVE METABOLIC PANEL
ALT: 25 U/L (ref 0–44)
AST: 16 U/L (ref 15–41)
Albumin: 3.5 g/dL (ref 3.5–5.0)
Alkaline Phosphatase: 63 U/L (ref 38–126)
Anion gap: 7 (ref 5–15)
BUN: 20 mg/dL (ref 8–23)
CO2: 23 mmol/L (ref 22–32)
Calcium: 8.5 mg/dL — ABNORMAL LOW (ref 8.9–10.3)
Chloride: 112 mmol/L — ABNORMAL HIGH (ref 98–111)
Creatinine, Ser: 1.28 mg/dL — ABNORMAL HIGH (ref 0.61–1.24)
GFR, Estimated: >60 mL/min (ref >60)
Glucose, Bld: 93 mg/dL (ref 70–99)
Potassium: 3.4 mmol/L — ABNORMAL LOW (ref 3.5–5.1)
Sodium: 142 mmol/L (ref 135–145)
Total Bilirubin: 0.8 mg/dL (ref 0.3–1.2)
Total Protein: 5.6 g/dL — ABNORMAL LOW (ref 6.5–8.1)

## 2022-05-02 LAB — CBC WITH DIFFERENTIAL/PLATELET
Abs Immature Granulocytes: 0.12 10*3/uL — ABNORMAL HIGH (ref 0.00–0.07)
Basophils Absolute: 0.1 10*3/uL (ref 0.0–0.1)
Basophils Relative: 1 %
Eosinophils Absolute: 0.3 10*3/uL (ref 0.0–0.5)
Eosinophils Relative: 3 %
HCT: 33.7 % — ABNORMAL LOW (ref 39.0–52.0)
Hemoglobin: 11.4 g/dL — ABNORMAL LOW (ref 13.0–17.0)
Immature Granulocytes: 2 %
Lymphocytes Relative: 44 %
Lymphs Abs: 3.6 10*3/uL (ref 0.7–4.0)
MCH: 31.9 pg (ref 26.0–34.0)
MCHC: 33.8 g/dL (ref 30.0–36.0)
MCV: 94.4 fL (ref 80.0–100.0)
Monocytes Absolute: 0.5 10*3/uL (ref 0.1–1.0)
Monocytes Relative: 6 %
Neutro Abs: 3.6 10*3/uL (ref 1.7–7.7)
Neutrophils Relative %: 44 %
Platelets: 338 10*3/uL (ref 150–400)
RBC: 3.57 MIL/uL — ABNORMAL LOW (ref 4.22–5.81)
RDW: 12.8 % (ref 11.5–15.5)
WBC: 8.2 10*3/uL (ref 4.0–10.5)
nRBC: 0 % (ref 0.0–0.2)

## 2022-05-02 MED ORDER — POTASSIUM CHLORIDE CRYS ER 10 MEQ PO TBCR
20.0000 meq | EXTENDED_RELEASE_TABLET | Freq: Once | ORAL | Status: AC
  Administered 2022-05-02: 20 meq via ORAL
  Filled 2022-05-02: qty 2

## 2022-05-02 MED ORDER — SODIUM CHLORIDE 0.9 % IV SOLN
Freq: Once | INTRAVENOUS | Status: AC
  Filled 2022-05-02: qty 250

## 2022-05-02 NOTE — Progress Notes (Signed)
Symptom Management [place] at [facility] [phone] [phone]  Patient Care Team: Jerrol Banana., MD as PCP - General (Family Medicine) Cammie Sickle, MD as Consulting Physician (Oncology)   Name of the patient: [name]  [id number]  1956/03/29   Date of visit: 05/02/22  Reason for Consult: [name] is a 66 y.o. male who presents today for:  Fatigue: Patient was seen last week in clinic for fatigue. He had labs to assess for any adrenal insufficiency(ACTH and Cortisol normal) and was started on prednisone 40 mg. He also has been referred to cardiology and has been found to have severe sleep apnea. In addition he has hypothyroidism secondary to [place]. He is currently on synthroid 100 mcg/daily with last TSH level on 04/25/2022 being normal at 3.4. He was also found to have some anxiety contributing to insomnia. Remeron was suggested but not started. Today he reports that he is feeling much better. The prednisone has helped him a lot and his fatigue, tingling in his hands, body aches have all resolved. He has 3 more days left of this medication.    Denies any neurologic complaints. Denies recent fevers or illnesses. Denies any easy bleeding or bruising. Reports good appetite and denies weight loss. Denies chest pain. Denies any nausea, vomiting, constipation, or diarrhea. Denies urinary complaints. Patient offers no further specific complaints today.    PAST MEDICAL HISTORY: Past Medical History:  Diagnosis Date   Basal cell carcinoma 03/10/2013   Right medial forehead. Excised, margins free.   Dysplastic nevus 11/05/2006   Mid back paraspinal, [name], close to edge. Excised 12/24/2006, margins free.   Dysplastic nevus 12/15/2008   Left lat. pectoral area. Moderate atypia, extends to one edge.    Dysplastic nevus 12/07/2009   Left mid side. Moderate atypia,  close to margin.   Dysplastic nevus 12/07/2009   Right posterior waistline. Moderate atypia, close to margin.   Dysplastic nevus 02/10/2014   Left paraspinal mid back. Moderate atypia, lateral and deep margin involved.    Dysplastic nevus 06/16/2014   Left epigastric. Mild atypia, deep margin involved.    GERD (gastroesophageal reflux disease)    Hx of basal cell carcinoma    multiple sites   Hx of malignant melanoma 11/11/2013   L lateral infraclavicular, superficial spreading, Breslow's 0.77m, Clark's level III   Hypothyroidism    [place]an[place] 11/11/2013   Left lateral infraclavicular. MM, superficial spreading. Anatomic level III. Tumor thickness 0.681m Excised 11/24/2013, margins free.     PAST SURGICAL HISTORY:  Past Surgical History:  Procedure Laterality Date   AXILLARY LYMPH NODE DISSECTION Left 11/02/2021   Procedure: AXILLARY LYMPH NODE DISSECTION;  [name];  [name];  Service: General;  Laterality: Left;   COLONOSCOPY WITH PROPOFOL N/A 01/20/2017   Procedure: COLONOSCOPY WITH PROPOFOL;  [name];  [facility];  Service: Endoscopy;  [name];   ESOPHAGOGASTRODUODENOSCOPY (EGD) WITH ESOPHAGEAL DILATION     MELANOMA EXCISION Right 11/24/2013   lateral infraclavicular   WISDOM TOOTH EXTRACTION     WRIST SURGERY Left 2022    HEMATOLOGY/ONCOLOGY HISTORY:  Oncology History Overview Note  #  2015- left collar bone [Dr.Kowalski]- s/p resection- 0.65 mm  #  A. 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   There is sufficient material for ancillary molecular testing if needed  (block A3,  A1, A2).   Comment:  Per provided outside pathology report the patient had an "at least pT1b"  malignant melanoma of the left lateral infraclavicular skin in 2015.  Touch preparations demonstrate predominantly discohesive large  pleomorphic cells, some with binucleation.  HE  sections demonstrate  lymph node parenchyma involved by metastatic neoplasm.  The neoplastic  cells are pleomorphic with binucleation, focal intranuclear inclusions,  and associated tumor necrosis. The neoplastic cells demonstrate the  following pattern of immunoreactivity:  S100: Positive  SOX10: Positive  HMB-45: Negative  Melan-A: Negative  Super pancytokeratin: Negative  CD45: Negative  This pattern of immunoreactivity supports the above diagnosis.   # NOV 7th, 2022-neoadjuvant Keytruda cycle #1  # JAN, 26th, [name],  majority of these foci measure 1-2 mm in greatest dimension, with the largest measuring 4 mm.  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  MEDICATIONS:  Current Outpatient Medications  Medication Sig Dispense Refill   aspirin 81 MG tablet Take 81 mg by mouth daily.     fluorouracil (EFUDEX) 5 % cream Apply to scalp twice a day for 7 days, (Patient not taking: Reported on 03/25/2022) 40 g 1   levothyroxine (SYNTHROID) 100 MCG tablet Take 1 tablet (100 mcg total) by mouth daily. 30 tablet 3   mirtazapine (REMERON) 7.5 MG tablet Take 1 tablet (7.5 mg total) by mouth at bedtime. 30 tablet 2   Multiple Vitamins tablet Take 1 tablet by mouth daily. (Patient not taking: Reported on 04/25/2022)     Multiple Vitamins-Minerals (PRESERVISION AREDS 2 PO) Take 1 capsule by mouth in  the morning and at bedtime.     pantoprazole (PROTONIX) 20 MG tablet Take 20 mg by mouth daily.  0   Pembrolizumab (KEYTRUDA IV) Inject 1 Dose into the vein every 21 ( twenty-one) days.     predniSONE (DELTASONE) 20 MG tablet Take 2 tablets (40 mg total) by mouth daily with breakfast. 20 tablet 0   valACYclovir (VALTREX) 1000 MG tablet Take 1 tablet (1,000 mg total) by mouth 2 (two) times daily. (Patient not taking: Reported on 04/25/2022) 10 tablet 0   No current facility-administered medications for this visit.    VITAL SIGNS: BP 114/66   Pulse 75   Temp 98.7 F (37.1 C)   Resp 20   Wt 188 lb (85.3 kg)   SpO2 100%   BMI 24.80 kg/m  Filed Weights   05/02/22 0911  Weight: 188 lb (85.3 kg)    Estimated body mass index is 24.8 kg/m as calculated from the following:   Height as of 04/25/22: '6\' 1"'$  (1.854 m).   Weight as of this encounter: 188 lb (85.3 kg).  LABS: CBC:    Component Value Date/Time   WBC 8.2 05/02/2022 0858   HGB 11.4 (L) 05/02/2022 0858   HGB 14.9 06/26/2021 1416   HCT 33.7 (L) 05/02/2022 9242  HCT 45.1 06/26/2021 1416   PLT 338 05/02/2022 0858   PLT 258 06/26/2021 1416   MCV 94.4 05/02/2022 0858   MCV 94 06/26/2021 1416   NEUTROABS 3.6 05/02/2022 0858   NEUTROABS 3.1 06/26/2021 1416   LYMPHSABS 3.6 05/02/2022 0858   LYMPHSABS 1.5 06/26/2021 1416   MONOABS 0.5 05/02/2022 0858   EOSABS 0.3 05/02/2022 0858   EOSABS 0.1 06/26/2021 1416   BASOSABS 0.1 05/02/2022 0858   BASOSABS 0.0 06/26/2021 1416   Comprehensive Metabolic Panel:    Component Value Date/Time   NA 142 05/02/2022 0858   NA 145 (H) 06/26/2021 1416   K 3.4 (L) 05/02/2022 0858   CL 112 (H) 05/02/2022 0858   CO2 23 05/02/2022 0858   BUN 20 05/02/2022 0858   BUN 16 06/26/2021 1416   CREATININE 1.28 (H) 05/02/2022 0858   GLUCOSE 93 05/02/2022 0858   CALCIUM 8.5 (L) 05/02/2022 0858   AST 16 05/02/2022 0858   ALT 25 05/02/2022 0858   ALKPHOS 63 05/02/2022 0858   BILITOT 0.8 05/02/2022  0858   BILITOT 0.4 06/26/2021 1416   PROT 5.6 (L) 05/02/2022 0858   PROT 6.7 06/26/2021 1416   ALBUMIN 3.5 05/02/2022 0858   ALBUMIN 4.6 06/26/2021 1416    RADIOGRAPHIC STUDIES: MR Brain W Wo Contrast  Result Date: 04/28/2022 CLINICAL DATA:  Provided history: Adrenal insufficiency. Rule out any pituitary hypophysitis/malignant melanoma metastatic to lymph nodes. 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 EXAM: MRI HEAD WITHOUT AND WITH CONTRAST TECHNIQUE: Multiplanar, multiecho pulse sequences of the brain and surrounding structures were obtained without and with intravenous contrast. CONTRAST:  73m GADAVIST GADOBUTROL 1 MMOL/ML IV SOLN COMPARISON:  Brain MRI 07/18/2021. FINDINGS: Brain: Mild generalized parenchymal atrophy. Minimal multifocal T2 FLAIR hyperintense signal abnormality within the cerebral white matter, nonspecific but compatible chronic small vessel ischemic disease. There is no acute infarct. No evidence of an intracranial mass. No chronic intracranial blood products. No extra-axial fluid collection. No midline shift. No pathologic intracranial enhancement identified. No MR evidence of hypophysitis. Vascular: Maintained flow voids within the proximal large arterial vessels. Skull and upper cervical spine: No focal suspicious marrow lesion. Sinuses/Orbits: No mass or acute finding within the imaged orbits. Other: Small-volume fluid within the bilateral mastoid air cells. IMPRESSION: No evidence of acute intracranial abnormality. Specifically, there is no MR evidence of hypophysitis. No evidence of intracranial metastatic disease. Minimal chronic small vessel image changes within the cerebral white matter. Mild generalized parenchymal atrophy. Small-volume fluid within the bilateral mastoid air cells. Electronically Signed   By: KKellie SimmeringD.O.   On: 04/28/2022 16:23    PERFORMANCE STATUS (ECOG) : 1 - Symptomatic but completely ambulatory  Review of Systems Unless otherwise noted,  a complete review of systems is negative.  Physical Exam General: NAD Cardiovascular: regular rate and rhythm Pulmonary: clear ant fields Abdomen: soft, nontender, + bowel sounds GU: no suprapubic tenderness Extremities: no edema, no joint deformities Skin: no rashes Neurological: Weakness but otherwise nonfocal  Assessment and Plan- Patient is a 66y.o. male    Encounter Diagnoses  Name Primary?   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 Not due for Keytruda until next week. Patient wishes to discuss holding vs restarting with Dr. BRogue Bussingas well. Labs reviewed.  Improving. IVF today along with 20 meq oral K.  Improved. Question if  the [name] is causing the extent of his fatigue- if symptoms return when he restarts the [place] he will discuss quality of life vs finishing [place] course.  PLAN: 1L IVF today RTC next week with MD, labs (CBC, CMP, TSH), +- Keytruda     Patient expressed understanding and was in agreement with this plan. He also understands that He can call clinic at any time with any questions, concerns, or complaints.   Thank you for allowing me to participate in the care of this very pleasant patient.   Time Total: 25  Visit consisted of counseling and education dealing with the complex and emotionally intense issues of symptom management in the setting of serious illness.Greater than 50%  of this time was spent counseling and coordinating care related to the above assessment and plan.  Signed by: Nelwyn Salisbury, PA-C

## 2022-05-02 NOTE — Progress Notes (Signed)
Patient states he is feeling a lot better. He has got his appetite back, no more weakness, aches and pains are going away.

## 2022-05-02 NOTE — Progress Notes (Signed)
Pt received 1 L NS over 1 hour via peripheral line in Right [facility] # 22 angio cath. Pt also received 20 mEq KCL orally while in clinic. Discharged to home feeling well.

## 2022-05-07 ENCOUNTER — Inpatient Hospital Stay: Payer: Medicare HMO

## 2022-05-07 ENCOUNTER — Other Ambulatory Visit: Payer: Self-pay

## 2022-05-07 ENCOUNTER — Inpatient Hospital Stay (HOSPITAL_BASED_OUTPATIENT_CLINIC_OR_DEPARTMENT_OTHER): Payer: Medicare HMO | Admitting: Internal Medicine

## 2022-05-07 ENCOUNTER — Inpatient Hospital Stay: Payer: Medicare HMO | Attending: Oncology

## 2022-05-07 ENCOUNTER — Encounter: Payer: Self-pay | Admitting: Internal Medicine

## 2022-05-07 DIAGNOSIS — R5383 Other fatigue: Secondary | ICD-10-CM | POA: Diagnosis not present

## 2022-05-07 DIAGNOSIS — C4359 Malignant melanoma of other part of trunk: Secondary | ICD-10-CM | POA: Insufficient documentation

## 2022-05-07 DIAGNOSIS — Z5112 Encounter for antineoplastic immunotherapy: Secondary | ICD-10-CM | POA: Diagnosis not present

## 2022-05-07 DIAGNOSIS — C439 Malignant melanoma of skin, unspecified: Secondary | ICD-10-CM | POA: Diagnosis not present

## 2022-05-07 DIAGNOSIS — Z7952 Long term (current) use of systemic steroids: Secondary | ICD-10-CM | POA: Diagnosis not present

## 2022-05-07 DIAGNOSIS — E032 Hypothyroidism due to medicaments and other exogenous substances: Secondary | ICD-10-CM | POA: Insufficient documentation

## 2022-05-07 DIAGNOSIS — Z801 Family history of malignant neoplasm of trachea, bronchus and lung: Secondary | ICD-10-CM | POA: Diagnosis not present

## 2022-05-07 DIAGNOSIS — Z79899 Other long term (current) drug therapy: Secondary | ICD-10-CM | POA: Diagnosis not present

## 2022-05-07 DIAGNOSIS — E2740 Unspecified adrenocortical insufficiency: Secondary | ICD-10-CM | POA: Diagnosis not present

## 2022-05-07 DIAGNOSIS — C779 Secondary and unspecified malignant neoplasm of lymph node, unspecified: Secondary | ICD-10-CM

## 2022-05-07 DIAGNOSIS — Z79890 Hormone replacement therapy (postmenopausal): Secondary | ICD-10-CM | POA: Insufficient documentation

## 2022-05-07 DIAGNOSIS — C773 Secondary and unspecified malignant neoplasm of axilla and upper limb lymph nodes: Secondary | ICD-10-CM | POA: Diagnosis not present

## 2022-05-07 LAB — CBC WITH DIFFERENTIAL/PLATELET
Abs Immature Granulocytes: 0.09 10*3/uL — ABNORMAL HIGH (ref 0.00–0.07)
Basophils Absolute: 0.1 10*3/uL (ref 0.0–0.1)
Basophils Relative: 1 %
Eosinophils Absolute: 0.5 10*3/uL (ref 0.0–0.5)
Eosinophils Relative: 6 %
HCT: 39.8 % (ref 39.0–52.0)
Hemoglobin: 12.9 g/dL — ABNORMAL LOW (ref 13.0–17.0)
Immature Granulocytes: 1 %
Lymphocytes Relative: 44 %
Lymphs Abs: 3.6 10*3/uL (ref 0.7–4.0)
MCH: 31.3 pg (ref 26.0–34.0)
MCHC: 32.4 g/dL (ref 30.0–36.0)
MCV: 96.6 fL (ref 80.0–100.0)
Monocytes Absolute: 0.5 10*3/uL (ref 0.1–1.0)
Monocytes Relative: 7 %
Neutro Abs: 3.3 10*3/uL (ref 1.7–7.7)
Neutrophils Relative %: 41 %
Platelets: 353 10*3/uL (ref 150–400)
RBC: 4.12 MIL/uL — ABNORMAL LOW (ref 4.22–5.81)
RDW: 13.9 % (ref 11.5–15.5)
WBC: 8.1 10*3/uL (ref 4.0–10.5)
nRBC: 0 % (ref 0.0–0.2)

## 2022-05-07 LAB — COMPREHENSIVE METABOLIC PANEL
ALT: 24 U/L (ref 0–44)
AST: 44 U/L — ABNORMAL HIGH (ref 15–41)
Albumin: 3.7 g/dL (ref 3.5–5.0)
Alkaline Phosphatase: 53 U/L (ref 38–126)
Anion gap: 8 (ref 5–15)
BUN: 19 mg/dL (ref 8–23)
CO2: 23 mmol/L (ref 22–32)
Calcium: 8.5 mg/dL — ABNORMAL LOW (ref 8.9–10.3)
Chloride: 107 mmol/L (ref 98–111)
Creatinine, Ser: 1.24 mg/dL (ref 0.61–1.24)
GFR, Estimated: >60 mL/min (ref >60)
Glucose, Bld: 107 mg/dL — ABNORMAL HIGH (ref 70–99)
Potassium: 4.2 mmol/L (ref 3.5–5.1)
Sodium: 138 mmol/L (ref 135–145)
Total Bilirubin: 0.8 mg/dL (ref 0.3–1.2)
Total Protein: 5.7 g/dL — ABNORMAL LOW (ref 6.5–8.1)

## 2022-05-07 MED ORDER — HYDROCORTISONE 10 MG PO TABS: ORAL_TABLET | ORAL | 1 refills | Status: DC

## 2022-05-07 NOTE — Progress Notes (Signed)
Results.   Faythe Casa, NP 05/07/2022 1:05 PM

## 2022-05-07 NOTE — Progress Notes (Signed)
Nutrition Assessment   Reason for Assessment:   Weight loss, poor po intake   ASSESSMENT:  66 year old male with recurrent melanoma left axillary lymphadenopathy.  Patient on [place] but currently on hold.    Met with patient and wife in clinic after MD visit.  Patient reports that appetite is better than a few weeks ago.  Reports that volume of meals is not as much as before and is not snacking as much as before.  Stopped prednisone on 7/30. Never started remeron.  Typically has breakfast of 1 cup black coffee, Kind bar or banana with peanut butter or yogurt and granola, or piece of toast with egg.  Lunch is [place] sandwich or cheese burger, BLT sandwich or veggie plate.  Dinner is meat (salmon, steak, etc)  or pasta with salad and side dish.  Reports food is tasting normal.  No constipation or diarrhea.  Has oral nutrition supplements but did not start them as appetite improved.    Says that he feels good at current weight   Medications: reviewed   Labs: reviewed   Anthropometrics:   Height: 73 inches Weight: 186 lb 9.6 oz  201 lb 6.4 oz on 01/21/22 BMI: 24  7% weight loss in the last 3 1/2 months, concerning  Estimated Energy Needs  Kcals: 2100-2500 Protein: 105-125 g Fluid: 2100-2500   NUTRITION DIAGNOSIS: Inadequate oral intake related to cancer related treatment side effects as evidenced by 7% weight loss in the last 3 1/2 months, poor appetite although improving at this time   INTERVENTION:  Would recommend High Calorie, High Protein diet if appetite decreases again and/or weight declines.  Handout provided Currently with appetite good anticipate weight to normalize.  Encouraged good sources of protein at each meal Encouraged patient to add plant foods in diet Encouraged hydration and discussed other sources of hydration besides water.  Contact information provided   MONITORING, EVALUATION, GOAL: weight trends, intake   Next Visit: Tuesday, August 22nd  during infusion  Tuan Tippin B. Zenia Resides, [place], [place] Registered Dietitian [phone]

## 2022-05-07 NOTE — Progress Notes (Unsigned)
Patient feeling much better today with improved appetite.  Hand pain improved but still having neuropathy feeling.  Took last Prednisone dose 2 days ago.  Would like to discuss sleep apnea that was shown on sleep study ordered by neurologist.

## 2022-05-07 NOTE — Assessment & Plan Note (Addendum)
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  S/p neoadjuvant Keytruda x4- -Status post axillary lymph node dissection-with residual micrometastatic disease.  Patient currently on adjuvant Keytruda.  Tolerating with moderate-severe difficulties see below  # HOLD [name] #12 today of planned total 18. Labs today reviewed;  acceptable for treatment today.   #Iatrogenic hypothyroidism [April 2023; TSH- 44]-on Synthroid 100 mcg once a day.  JUNE 2023 TSH 8 Awaiting labs today.  # Extreme fatigue-extensive work-up including MRI brain-negative for any hypophysitis; however cortisol 0.8; with normal ATCH [AM]- will refer to endocrinology; recommend  hydrocortsione   # Upper Extremity numbness/pain  [Right > Left] in AM- s/p evaluation with Dr.Shah [neurology-JUNE 2023]. Awaiting NCS.  cervical spine MRI with and without contrast- degenerative; no evidence of cancer.   # LFTs- Mild - likely to [place]. Monitor closley.     # Left shoulder rash: ? shingles vs- dermatitis from [facility]. Plan valacyclovir 1 mg BID x 5 days; ok with hydrocortisone.   # ?  Obstructive sleep apnea-awaiting evaluation with sleep study/home [neurology]  * Monday pref  # DISPOSITION:   # refer to endocrinology re: iatrogenic adrenal insufficiency/hypothroidism ASAP # HOLD keytruda today # follow up in 3 weeks- MD; labs- cbc/cmp;Keytruda;Thyroid profile  Dr.B.

## 2022-05-07 NOTE — Progress Notes (Unsigned)
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   There is sufficient material for ancillary molecular testing if needed  (block A3, A1, A2).   Comment:  Per provided outside pathology report the patient had an "at least pT1b"  malignant melanoma of the left lateral infraclavicular skin in 2015.  Touch preparations demonstrate predominantly discohesive large  pleomorphic cells, some with binucleation.  HE sections demonstrate  lymph node parenchyma involved by metastatic neoplasm.  The neoplastic  cells are pleomorphic with binucleation, focal intranuclear inclusions,  and associated tumor necrosis. The neoplastic cells demonstrate the  following pattern of immunoreactivity:  S100: Positive  SOX10: Positive  HMB-45: Negative  Melan-A: Negative  Super pancytokeratin: Negative  CD45: Negative  This pattern of immunoreactivity supports the above diagnosis.   # NOV 7th, 2022-neoadjuvant Keytruda cycle #1  # JAN, 26th, [name],  majority of these foci measure 1-2 mm in greatest dimension, with the largest measuring 4 mm.  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  Accompanied by his wife.  [name] 66 y.o.  male pleasant patient above history of recurrent melanoma left axillary lymphadenopathy-s/p surgery is currently on adjuvant [name] is here for follow-up.  [name] is on hold-because of poor tolerance [worsening fatigue; multitude of other complaints-nausea/shortness of breath etc.].  Patient had extensive work-up for adrenal insufficiency-including MRI brain; also cortisol/ACTH levels.  Given low cortisol levels/ACTH patient started on steroids prednisone.  Patient noted to have significant improvement on the prednisone 40 mg a day.  He is back to baseline.  He is off prednisone for the last 3 days.  Patient is also on Synthroid for his iatrogenic hypothyroidism.  Noted to have significant improvement of his nerve pain.  Review of Systems  Constitutional:  Negative for chills, diaphoresis, fever, malaise/fatigue and weight loss.  HENT:  Negative for nosebleeds and sore throat.   Eyes:  Negative for double vision.  Respiratory:  Negative for hemoptysis, sputum production, shortness of breath and wheezing.   Cardiovascular:  Negative for chest pain, palpitations, orthopnea and leg swelling.  Gastrointestinal:  Negative for abdominal pain, blood in stool, constipation, diarrhea, heartburn, melena, nausea and vomiting.  Genitourinary:  Negative for dysuria, frequency and urgency.  Musculoskeletal:  Positive for myalgias. Negative for back pain and joint pain.  Skin: Negative.  Negative for itching and rash.  Neurological:  Positive for tingling. Negative for dizziness, focal weakness and weakness.  Endo/Heme/Allergies:  Does not  bruise/bleed easily.  Psychiatric/Behavioral:  Negative for depression. The patient is not nervous/anxious and does not have insomnia.       PAST MEDICAL HISTORY :  Past Medical History:  Diagnosis Date   Basal cell carcinoma 03/10/2013   Right medial forehead. Excised, margins free.   Dysplastic nevus 11/05/2006   Mid back paraspinal, [name], close to edge. Excised 12/24/2006, margins free.   Dysplastic nevus 12/15/2008   Left lat. pectoral area. Moderate atypia, extends to one edge.    Dysplastic nevus 12/07/2009   Left mid side. Moderate atypia, close to margin.   Dysplastic nevus 12/07/2009   Right posterior waistline. Moderate atypia, close to margin.   Dysplastic nevus 02/10/2014   Left paraspinal mid back. Moderate atypia, lateral and deep margin involved.    Dysplastic nevus 06/16/2014   Left epigastric. Mild atypia, deep margin involved.    GERD (gastroesophageal reflux disease)    Hx of basal cell carcinoma    multiple sites   Hx of malignant melanoma 11/11/2013   L lateral infraclavicular, superficial spreading, Breslow's 0.31m, Clark's level III   Hypothyroidism    [place]   Left lateral infraclavicular. MM, superficial spreading. Anatomic level III. Tumor thickness 0.610m Excised 11/24/2013, margins free.     PAST SURGICAL HISTORY :   Past Surgical History:  Procedure Laterality Date   AXILLARY LYMPH NODE DISSECTION Left 11/02/2021   Procedure: AXILLARY LYMPH NODE DISSECTION;  [name];  [name];  Service: General;  Laterality: Left;   COLONOSCOPY WITH PROPOFOL N/A 01/20/2017   Procedure: COLONOSCOPY WITH PROPOFOL;  Surgeon: Tyler SailsMD;  [facility];  Service: Endoscopy;  [name];   ESOPHAGOGASTRODUODENOSCOPY (EGD) WITH ESOPHAGEAL DILATION     MELANOMA EXCISION Right 11/24/2013   lateral infraclavicular   WISDOM TOOTH EXTRACTION     WRIST  SURGERY Left 2022    FAMILY HISTORY :   Family History  Problem Relation Age of Onset   Macular degeneration Mother    Hyperlipidemia Mother    Heart disease Father    Heart attack Father    Lung cancer Father        metastasized    SOCIAL HISTORY:   Social History   Tobacco Use   Smoking status: Never   Smokeless tobacco: Never  Vaping Use   Vaping Use: Never used  Substance Use Topics   Alcohol use: Yes    Comment: about 1-3 glass of wine with dinner   Drug use: No    ALLERGIES:  has No Known Allergies.  MEDICATIONS:  Current Outpatient Medications  Medication Sig Dispense Refill   aspirin 81 MG tablet Take 81 mg by mouth daily.     hydrocortisone (CORTEF) 10 MG tablet TAKE 2 PILLS IN AM; 1 PILL AT NIGHT. DO NOT STOP UNTIL DIRECTED. Take with food. 90 tablet 1   levothyroxine (SYNTHROID) 100 MCG tablet Take 1 tablet (100 mcg total) by mouth daily. 30 tablet 3   Multiple Vitamins-Minerals (PRESERVISION AREDS 2 PO) Take 1 capsule by mouth in the morning and at bedtime.     pantoprazole (PROTONIX) 20 MG tablet Take 20 mg by mouth daily.  0   Pembrolizumab (KEYTRUDA IV) Inject 1 Dose into the vein every 21 ( twenty-one) days.     fluorouracil (EFUDEX) 5 % cream Apply to  scalp twice a day for 7 days, (Patient not taking: Reported on 03/25/2022) 40 g 1   mirtazapine (REMERON) 7.5 MG tablet Take 1 tablet (7.5 mg total) by mouth at bedtime. 30 tablet 2   Multiple Vitamins tablet Take 1 tablet by mouth daily. (Patient not taking: Reported on 04/25/2022)     predniSONE (DELTASONE) 20 MG tablet Take 2 tablets (40 mg total) by mouth daily with breakfast. (Patient not taking: Reported on 05/07/2022) 20 tablet 0   valACYclovir (VALTREX) 1000 MG tablet Take 1 tablet (1,000 mg total) by mouth 2 (two) times daily. 10 tablet 0   No current facility-administered medications for this visit.    PHYSICAL EXAMINATION: ECOG PERFORMANCE STATUS: 0 - Asymptomatic  BP 128/78 (BP Location: Right  Arm, Patient Position: Sitting)   Pulse 64   Temp 97.8 F (36.6 C) (Tympanic)   Resp 18   Ht '6\' 1"'$  (1.854 m)   Wt 186 lb 9.6 oz (84.6 kg)   BMI 24.62 kg/m   Filed Weights   05/07/22 0900  Weight: 186 lb 9.6 oz (84.6 kg)     Positive for lymph node left underarm approximate 2 to 3 cm in size.  Physical Exam Vitals and nursing note reviewed.  HENT:     Head: Normocephalic and atraumatic.     Mouth/Throat:     Pharynx: Oropharynx is clear.  Eyes:     Extraocular Movements: Extraocular movements intact.     Pupils: Pupils are equal, round, and reactive to light.  Cardiovascular:     Rate and Rhythm: Normal rate and regular rhythm.  Pulmonary:     Comments: Decreased breath sounds bilaterally.  Abdominal:     Palpations: Abdomen is soft.  Musculoskeletal:        General: Normal range of motion.     Cervical back: Normal range of motion.  Skin:    General: Skin is warm.  Neurological:     General: No focal deficit present.     Mental Status: He is alert and oriented to person, place, and time.  Psychiatric:        Behavior: Behavior normal.        Judgment: Judgment normal.        LABORATORY DATA:  I have reviewed the data as listed    Component Value Date/Time   NA 138 05/07/2022 0905   NA 145 (H) 06/26/2021 1416   K 4.2 05/07/2022 0905   CL 107 05/07/2022 0905   CO2 23 05/07/2022 0905   GLUCOSE 107 (H) 05/07/2022 0905   BUN 19 05/07/2022 0905   BUN 16 06/26/2021 1416   CREATININE 1.24 05/07/2022 0905   CALCIUM 8.5 (L) 05/07/2022 0905   PROT 5.7 (L) 05/07/2022 0905   PROT 6.7 06/26/2021 1416   ALBUMIN 3.7 05/07/2022 0905   ALBUMIN 4.6 06/26/2021 1416   AST 44 (H) 05/07/2022 0905   ALT 24 05/07/2022 0905   ALKPHOS 53 05/07/2022 0905   BILITOT 0.8 05/07/2022 0905   BILITOT 0.4 06/26/2021 1416   GFRNONAA >60 05/07/2022 0905   GFRAA 75 05/10/2020 1017    No results found for: "SPEP", "UPEP"  Lab Results  Component Value Date   WBC 8.1 05/07/2022    NEUTROABS 3.3 05/07/2022   HGB 12.9 (L) 05/07/2022   HCT 39.8 05/07/2022   MCV 96.6 05/07/2022   PLT 353 05/07/2022      Chemistry      Component Value Date/Time   NA 138 05/07/2022 0905  NA 145 (H) 06/26/2021 1416   K 4.2 05/07/2022 0905   CL 107 05/07/2022 0905   CO2 23 05/07/2022 0905   BUN 19 05/07/2022 0905   BUN 16 06/26/2021 1416   CREATININE 1.24 05/07/2022 0905   GLU 96 10/19/2014 0000      Component Value Date/Time   CALCIUM 8.5 (L) 05/07/2022 0905   ALKPHOS 53 05/07/2022 0905   AST 44 (H) 05/07/2022 0905   ALT 24 05/07/2022 0905   BILITOT 0.8 05/07/2022 0905   BILITOT 0.4 06/26/2021 1416       RADIOGRAPHIC STUDIES: I have personally reviewed the radiological images as listed and agreed with the findings in the report. No results found.   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  S/p neoadjuvant Keytruda x4- -Status post axillary lymph node dissection-with residual micrometastatic disease.  Patient currently on adjuvant Keytruda.  Tolerating with moderate-severe difficulties see below  # HOLD [name] #12 today of planned total 18. Labs today reviewed- see below.   #Iatrogenic hypothyroidism [April 2023; TSH- 44]-on Synthroid 100 mcg once a day.  JUNE 2023 TSH 8 Awaiting labs today.  # Extreme fatigue-extensive work-up including MRI brain-negative for any hypophysitis; however cortisol 0.8; with normal [facility] [AM]-with significant improvement post prednisone.  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  Will refer to endocrinology; recommend  hydrocortsione 20 mg in the morning and 10 mg at night.   # Upper Extremity numbness/pain  [Right > Left] in AM- s/p evaluation with Dr.Shah [neurology-JUNE 2023]. cervical spine MRI with and without contrast- degenerative; no evidence of cancer.  Currently improved on steroids.  Follow-up with neurology.  # LFTs- Mild - likely to  [place]. Monitor closley.   ## ?  Obstructive sleep apnea-awaiting evaluation with sleep study/home [neurology]-patient significant improvement noted in his symptoms on steroids.  Defer to Dr. Brigitte Pulse for further work-up.  Sent a message.  * Monday pref  # DISPOSITION:   # refer to endocrinology re: iatrogenic adrenal insufficiency/hypothroidism ASAP # HOLD keytruda today # follow up in 3 weeks- MD; labs- cbc/cmp;Keytruda;Thyroid profile  Dr.B.        Orders Placed This Encounter  Procedures   Ambulatory referral to Endocrinology    Referral Priority:   Routine    Referral Type:   Consultation    Referral Reason:   Specialty Services Required    Number of Visits Requested:   1    All questions were answered. The patient knows to call the clinic with any problems, questions or concerns.      Tyler Sickle, MD 05/08/2022 8:26 AM

## 2022-05-08 ENCOUNTER — Other Ambulatory Visit: Payer: Self-pay

## 2022-05-08 ENCOUNTER — Encounter: Payer: Self-pay | Admitting: Internal Medicine

## 2022-05-10 ENCOUNTER — Other Ambulatory Visit: Payer: Self-pay

## 2022-05-12 ENCOUNTER — Other Ambulatory Visit: Payer: Self-pay | Admitting: Internal Medicine

## 2022-05-13 ENCOUNTER — Encounter: Payer: Self-pay | Admitting: Internal Medicine

## 2022-05-13 NOTE — Telephone Encounter (Signed)
Thyroid Panel With TSH Order: [id number] Status: Final result    Visible to patient: Yes (seen)    Next appt: 05/16/2022 at 09:00 AM in [place] (Richard Cranford Mon, MD)    Dx: Malignant melanoma metastatic to lymp...    0 Result Notes           Component Ref Range & Units 2 wk ago (04/25/22) 3 wk ago (04/16/22) 1 mo ago (03/25/22) 2 mo ago (03/05/22) 3 mo ago (02/12/22) 3 mo ago (02/11/22) 5 mo ago (11/16/21)  TSH 0.450 - 4.500 uIU/mL 3.430  2.610  8.116 High  R, CM  17.638 High  R, CM  44.200 High   50.000 High  R, CM  1.411 R, CM   T4, Total 4.5 - 12.0 ug/dL 7.4  8.1    4.1 Low      T3 Uptake Ratio 24 - 39 % '26  28    25     '$ Free Thyroxine Index 1.2 - 4.9 1.9  2.3 CM    1.0 Low  CM     Comment: (NOTE)  Performed At: [facility]  [address] [place], [place] [id number]  Rush Farmer MD [id number]   Resulting Agency  [place] [facility] [place] [facility] [place] [facility] [place] [facility] [place] [facility] [place] [facility] [place] [facility]         Specimen Collected: 04/25/22 13:43 Last Resulted: 04/26/22 09:36

## 2022-05-15 DIAGNOSIS — M62830 Muscle spasm of back: Secondary | ICD-10-CM | POA: Diagnosis not present

## 2022-05-15 DIAGNOSIS — M9901 Segmental and somatic dysfunction of cervical region: Secondary | ICD-10-CM | POA: Diagnosis not present

## 2022-05-15 DIAGNOSIS — M5033 Other cervical disc degeneration, cervicothoracic region: Secondary | ICD-10-CM | POA: Diagnosis not present

## 2022-05-15 DIAGNOSIS — M9903 Segmental and somatic dysfunction of lumbar region: Secondary | ICD-10-CM | POA: Diagnosis not present

## 2022-05-16 ENCOUNTER — Encounter: Payer: Self-pay | Admitting: Family Medicine

## 2022-05-16 ENCOUNTER — Ambulatory Visit (INDEPENDENT_AMBULATORY_CARE_PROVIDER_SITE_OTHER): Payer: Medicare HMO | Admitting: Family Medicine

## 2022-05-16 VITALS — BP 124/80 | HR 69 | Resp 16 | Ht 73.0 in | Wt 187.0 lb

## 2022-05-16 DIAGNOSIS — G4733 Obstructive sleep apnea (adult) (pediatric): Secondary | ICD-10-CM | POA: Diagnosis not present

## 2022-05-16 DIAGNOSIS — T7840XA Allergy, unspecified, initial encounter: Secondary | ICD-10-CM

## 2022-05-16 DIAGNOSIS — E039 Hypothyroidism, unspecified: Secondary | ICD-10-CM | POA: Diagnosis not present

## 2022-05-16 DIAGNOSIS — Z0000 Encounter for general adult medical examination without abnormal findings: Secondary | ICD-10-CM

## 2022-05-16 DIAGNOSIS — Z125 Encounter for screening for malignant neoplasm of prostate: Secondary | ICD-10-CM | POA: Diagnosis not present

## 2022-05-16 DIAGNOSIS — E271 Primary adrenocortical insufficiency: Secondary | ICD-10-CM | POA: Diagnosis not present

## 2022-05-16 DIAGNOSIS — E7800 Pure hypercholesterolemia, unspecified: Secondary | ICD-10-CM | POA: Diagnosis not present

## 2022-05-16 DIAGNOSIS — C779 Secondary and unspecified malignant neoplasm of lymph node, unspecified: Secondary | ICD-10-CM | POA: Diagnosis not present

## 2022-05-16 DIAGNOSIS — C439 Malignant melanoma of skin, unspecified: Secondary | ICD-10-CM

## 2022-05-16 NOTE — Progress Notes (Unsigned)
Annual Wellness Visit    I,April Miller,acting as a scribe for Wilhemena Durie, MD.,have documented all relevant documentation on the behalf of Wilhemena Durie, MD,as directed by  Wilhemena Durie, MD while in the presence of Wilhemena Durie, MD.   Patient: [name], Male    DOB: 1956-08-14, 66 y.o.   MRN: [id number] Visit Date: 05/16/2022  Today's Provider: Wilhemena Durie, MD   Chief Complaint  Patient presents with   Medicare Wellness   Subjective    [name] is a 66 y.o. male who presents today for his Annual Wellness Visit.Annual Physical. He reports consuming a general diet. Home exercise routine includes walking. He generally feels well. He reports sleeping well. He does not have additional problems to discuss today.   HPI He has had an eventful year since his last visit as he is metastatic melanoma to an axillary node and has been on Keytruda multiple times.  This calendar year he has developed severe hypothyroidism, adrenal insufficiency with low cortisol neuropathy and severe sleep apnea. He is feeling much better as he has had his hypothyroidism treated and is on cortisone regularly for a couple of months.  He had had a sleep study which showed severe sleep apnea but this was during the time he had hypothyroidism and a low cortisol.  He is post to follow-up with cardiology due to the sleep apnea     Medications: Outpatient Medications Prior to Visit  Medication Sig   aspirin 81 MG tablet Take 81 mg by mouth daily.   fluorouracil (EFUDEX) 5 % cream Apply to scalp twice a day for 7 days,   hydrocortisone (CORTEF) 10 MG tablet TAKE 2 PILLS IN AM; 1 PILL AT NIGHT. DO NOT STOP UNTIL DIRECTED. Take with food.   levothyroxine (SYNTHROID) 100 MCG tablet TAKE 1 TABLET BY MOUTH EVERY DAY   Multiple Vitamins tablet Take 1 tablet by mouth daily.   Multiple Vitamins-Minerals (PRESERVISION AREDS 2 PO) Take 1 capsule by mouth in the morning and at  bedtime.   pantoprazole (PROTONIX) 20 MG tablet Take 20 mg by mouth daily.   Pembrolizumab (KEYTRUDA IV) Inject 1 Dose into the vein every 21 ( twenty-one) days.   [DISCONTINUED] mirtazapine (REMERON) 7.5 MG tablet Take 1 tablet (7.5 mg total) by mouth at bedtime.   [DISCONTINUED] predniSONE (DELTASONE) 20 MG tablet Take 2 tablets (40 mg total) by mouth daily with breakfast. (Patient not taking: Reported on 05/07/2022)   [DISCONTINUED] valACYclovir (VALTREX) 1000 MG tablet Take 1 tablet (1,000 mg total) by mouth 2 (two) times daily.   No facility-administered medications prior to visit.    No Known Allergies  Patient Care Team: Jerrol Banana., MD as PCP - General (Family Medicine) Cammie Sickle, MD as Consulting Physician (Oncology)  Review of Systems  Genitourinary:  Positive for frequency.  Musculoskeletal:  Positive for back pain.  All other systems reviewed and are negative.   Last thyroid functions Lab Results  Component Value Date   TSH 11.200 (H) 05/16/2022   T4TOTAL 7.4 04/25/2022        Objective    Vitals: BP 124/80 (BP Location: Left Arm, Patient Position: Sitting, Cuff Size: Large)   Pulse 69   Resp 16   Ht '6\' 1"'  (1.854 m)   Wt 187 lb (84.8 kg)   SpO2 97%   BMI 24.67 kg/m  BP Readings from Last 3 Encounters:  05/16/22 124/80  05/07/22 128/78  05/02/22 114/66   Wt Readings from Last 3 Encounters:  05/16/22 187 lb (84.8 kg)  05/07/22 186 lb 9.6 oz (84.6 kg)  05/02/22 188 lb (85.3 kg)      05/16/2022    9:09 AM  In your present state of health, do you have any difficulty performing the following activities:  Hearing? 1  Vision? 1  Difficulty concentrating or making decisions? 0  Walking or climbing stairs? 0  Dressing or bathing? 0  Doing errands, shopping? 0   Most recent fall risk assessment:    05/16/2022    9:09 AM  Fall Risk   Falls in the past year? 0  Number falls in past yr: 0  Injury with Fall? 0  Risk for fall due to :  No Fall Risks  Follow up Falls evaluation completed    Most recent depression screenings:    05/16/2022    9:09 AM 03/22/2021    9:59 AM  PHQ 2/9 Scores  PHQ - 2 Score 0 0  PHQ- 9 Score 0    Most recent cognitive screening:    05/15/2021   10:07 AM  6CIT Screen  What Year? 0 points  What month? 0 points  What time? 0 points  Count back from 20 0 points  Months in reverse 0 points  Repeat phrase 0 points  Total Score 0 points   Most recent Audit-C alcohol use screening    05/16/2022    9:09 AM  Alcohol Use Disorder Test (AUDIT)  1. How often do you have a drink containing alcohol? 1  2. How many drinks containing alcohol do you have on a typical day when you are drinking? 1  3. How often do you have six or more drinks on one occasion? 1  AUDIT-C Score 3  4. How often during the last year have you found that you were not able to stop drinking once you had started? 0  5. How often during the last year have you failed to do what was normally expected from you because of drinking? 0  6. How often during the last year have you needed a first drink in the morning to get yourself going after a heavy drinking session? 0  7. How often during the last year have you had a feeling of guilt of remorse after drinking? 0  8. How often during the last year have you been unable to remember what happened the night before because you had been drinking? 0  9. Have you or someone else been injured as a result of your drinking? 0  10. Has a relative or friend or a doctor or another health worker been concerned about your drinking or suggested you cut down? 0  Alcohol Use Disorder Identification Test Final Score (AUDIT) 3   A score of 3 or more in women, and 4 or more in men indicates increased risk for alcohol abuse, EXCEPT if all of the points are from question 1   Results for orders placed or performed in visit on 05/16/22  CBC w/Diff/Platelet  Result Value Ref Range   WBC 5.6 3.4 - 10.8  x10E3/uL   RBC 4.21 4.14 - 5.80 x10E6/uL   Hemoglobin 12.8 (L) 13.0 - 17.7 g/dL   Hematocrit 39.6 37.5 - 51.0 %   MCV 94 79 - 97 fL   MCH 30.4 26.6 - 33.0 pg   MCHC 32.3 31.5 - 35.7 g/dL   RDW 13.4 11.6 - 15.4 %   Platelets  200 150 - 450 x10E3/uL   Neutrophils 38 Not Estab. %   Lymphs 38 Not Estab. %   Monocytes 8 Not Estab. %   Eos 15 Not Estab. %   Basos 1 Not Estab. %   Neutrophils Absolute 2.1 1.4 - 7.0 x10E3/uL   Lymphocytes Absolute 2.2 0.7 - 3.1 x10E3/uL   Monocytes Absolute 0.4 0.1 - 0.9 x10E3/uL   EOS (ABSOLUTE) 0.8 (H) 0.0 - 0.4 x10E3/uL   Basophils Absolute 0.0 0.0 - 0.2 x10E3/uL   Immature Granulocytes 0 Not Estab. %   Immature Grans (Abs) 0.0 0.0 - 0.1 x10E3/uL  Comprehensive Metabolic Panel (CMET)  Result Value Ref Range   Glucose 89 70 - 99 mg/dL   BUN 12 8 - 27 mg/dL   Creatinine, Ser 1.21 0.76 - 1.27 mg/dL   eGFR 65 >59 mL/min/1.73   BUN/Creatinine Ratio 10 10 - 24   Sodium 144 134 - 144 mmol/L   Potassium 4.6 3.5 - 5.2 mmol/L   Chloride 106 96 - 106 mmol/L   CO2 25 20 - 29 mmol/L   Calcium 9.5 8.6 - 10.2 mg/dL   Total Protein 5.7 (L) 6.0 - 8.5 g/dL   Albumin 3.9 3.9 - 4.9 g/dL   Globulin, Total 1.8 1.5 - 4.5 g/dL   Albumin/Globulin Ratio 2.2 1.2 - 2.2   Bilirubin Total 0.5 0.0 - 1.2 mg/dL   Alkaline Phosphatase 64 44 - 121 IU/L   AST 18 0 - 40 IU/L   ALT 15 0 - 44 IU/L  TSH  Result Value Ref Range   TSH 11.200 (H) 0.450 - 4.500 uIU/mL  Lipid panel  Result Value Ref Range   Cholesterol, Total 199 100 - 199 mg/dL   Triglycerides 82 0 - 149 mg/dL   HDL 58 >39 mg/dL   VLDL Cholesterol Cal 15 5 - 40 mg/dL   LDL Chol Calc (NIH) 126 (H) 0 - 99 mg/dL   Chol/HDL Ratio 3.4 0.0 - 5.0 ratio  PSA  Result Value Ref Range   Prostate Specific Ag, Serum 1.4 0.0 - 4.0 ng/mL    Assessment & Plan     Annual wellness visit done today including the all of the following: Reviewed patient's Family Medical History Reviewed and updated list of patient's medical  providers Assessment of cognitive impairment was done Assessed patient's functional ability Established a written schedule for health screening services Health Risk Assessent Completed and Reviewed  Exercise Activities and Dietary recommendations  Goals   None     Immunization History  Administered Date(s) Administered   Fluad Quad(high Dose 65+) 06/08/2019, 07/17/2021   Influenza,inj,Quad PF,6+ Mos 08/01/2016, 07/15/2018   Influenza,inj,Quad PF,6-35 Mos 05/29/2017   Influenza-Unspecified 08/07/2015   Moderna Covid-19 Vaccine Bivalent Booster 24yr & up 07/19/2021   Pneumococcal Polysaccharide-23 05/10/2019   Td 10/29/2016   Tdap 12/01/2006   Zoster, Live 10/19/2014    Health Maintenance  Topic Date Due   Zoster Vaccines- Shingrix (1 of 2) Never done   Pneumonia Vaccine 68 Years old (2 - PCV) 05/09/2020   COVID-19 Vaccine (2 - Moderna risk series) 08/16/2021   INFLUENZA VACCINE  05/07/2022   TETANUS/TDAP  10/29/2026   COLONOSCOPY (Pts [id number] coverage will need to be confirmed)  01/21/2027   Hepatitis C Screening  Completed   HPV VACCINES  Aged Out     Discussed health benefits of physical activity, and encouraged him to engage in regular exercise appropriate for his age and condition.    1. Medicare  annual wellness visit, subsequent   2. Pure hypercholesterolemia  - CBC w/Diff/Platelet - Comprehensive Metabolic Panel (CMET) - TSH - Lipid panel  3. Adult hypothyroidism  - CBC w/Diff/Platelet - Comprehensive Metabolic Panel (CMET) - TSH - Lipid panel  4. Acute allergic reaction, initial encounter  - CBC w/Diff/Platelet - Comprehensive Metabolic Panel (CMET) - TSH - Lipid panel  5. OSA (obstructive sleep apnea)  - CBC w/Diff/Platelet - Comprehensive Metabolic Panel (CMET) - TSH - Lipid panel  6. Screening for prostate cancer  - PSA   Return in about 1 year (around 05/17/2023).     {provider attestation***:1}   Wilhemena Durie, MD  [facility] [phone] (phone) [phone] (fax)  [place]

## 2022-05-17 ENCOUNTER — Other Ambulatory Visit: Payer: Self-pay

## 2022-05-17 ENCOUNTER — Encounter: Payer: Self-pay | Admitting: Internal Medicine

## 2022-05-17 LAB — COMPREHENSIVE METABOLIC PANEL
ALT: 15 IU/L (ref 0–44)
AST: 18 IU/L (ref 0–40)
Albumin/Globulin Ratio: 2.2 (ref 1.2–2.2)
Albumin: 3.9 g/dL (ref 3.9–4.9)
Alkaline Phosphatase: 64 IU/L (ref 44–121)
BUN/Creatinine Ratio: 10 (ref 10–24)
BUN: 12 mg/dL (ref 8–27)
Bilirubin Total: 0.5 mg/dL (ref 0.0–1.2)
CO2: 25 mmol/L (ref 20–29)
Calcium: 9.5 mg/dL (ref 8.6–10.2)
Chloride: 106 mmol/L (ref 96–106)
Creatinine, Ser: 1.21 mg/dL (ref 0.76–1.27)
Globulin, Total: 1.8 g/dL (ref 1.5–4.5)
Glucose: 89 mg/dL (ref 70–99)
Potassium: 4.6 mmol/L (ref 3.5–5.2)
Sodium: 144 mmol/L (ref 134–144)
Total Protein: 5.7 g/dL — ABNORMAL LOW (ref 6.0–8.5)
eGFR: 65 mL/min/{1.73_m2} (ref >59)

## 2022-05-17 LAB — PSA: Prostate Specific Ag, Serum: 1.4 ng/mL (ref 0.0–4.0)

## 2022-05-17 LAB — TSH: TSH: 11.2 u[IU]/mL — ABNORMAL HIGH (ref 0.450–4.500)

## 2022-05-17 LAB — CBC WITH DIFFERENTIAL/PLATELET
Basophils Absolute: 0 10*3/uL (ref 0.0–0.2)
Basos: 1 %
EOS (ABSOLUTE): 0.8 10*3/uL — ABNORMAL HIGH (ref 0.0–0.4)
Eos: 15 %
Hematocrit: 39.6 % (ref 37.5–51.0)
Hemoglobin: 12.8 g/dL — ABNORMAL LOW (ref 13.0–17.7)
Immature Grans (Abs): 0 10*3/uL (ref 0.0–0.1)
Immature Granulocytes: 0 %
Lymphocytes Absolute: 2.2 10*3/uL (ref 0.7–3.1)
Lymphs: 38 %
MCH: 30.4 pg (ref 26.6–33.0)
MCHC: 32.3 g/dL (ref 31.5–35.7)
MCV: 94 fL (ref 79–97)
Monocytes Absolute: 0.4 10*3/uL (ref 0.1–0.9)
Monocytes: 8 %
Neutrophils Absolute: 2.1 10*3/uL (ref 1.4–7.0)
Neutrophils: 38 %
Platelets: 200 10*3/uL (ref 150–450)
RBC: 4.21 x10E6/uL (ref 4.14–5.80)
RDW: 13.4 % (ref 11.6–15.4)
WBC: 5.6 10*3/uL (ref 3.4–10.8)

## 2022-05-17 LAB — LIPID PANEL
Chol/HDL Ratio: 3.4 ratio (ref 0.0–5.0)
Cholesterol, Total: 199 mg/dL (ref 100–199)
HDL: 58 mg/dL (ref >39)
LDL Chol Calc (NIH): 126 mg/dL — ABNORMAL HIGH (ref 0–99)
Triglycerides: 82 mg/dL (ref 0–149)
VLDL Cholesterol Cal: 15 mg/dL (ref 5–40)

## 2022-05-22 ENCOUNTER — Other Ambulatory Visit: Payer: Self-pay | Admitting: *Deleted

## 2022-05-22 ENCOUNTER — Ambulatory Visit: Payer: Medicare HMO | Admitting: Dermatology

## 2022-05-22 ENCOUNTER — Encounter: Payer: Self-pay | Admitting: Dermatology

## 2022-05-22 DIAGNOSIS — D492 Neoplasm of unspecified behavior of bone, soft tissue, and skin: Secondary | ICD-10-CM

## 2022-05-22 DIAGNOSIS — Z85820 Personal history of malignant melanoma of skin: Secondary | ICD-10-CM | POA: Diagnosis not present

## 2022-05-22 DIAGNOSIS — L578 Other skin changes due to chronic exposure to nonionizing radiation: Secondary | ICD-10-CM

## 2022-05-22 DIAGNOSIS — C4359 Malignant melanoma of other part of trunk: Secondary | ICD-10-CM | POA: Diagnosis not present

## 2022-05-22 DIAGNOSIS — C439 Malignant melanoma of skin, unspecified: Secondary | ICD-10-CM

## 2022-05-22 DIAGNOSIS — D229 Melanocytic nevi, unspecified: Secondary | ICD-10-CM | POA: Diagnosis not present

## 2022-05-22 DIAGNOSIS — L309 Dermatitis, unspecified: Secondary | ICD-10-CM | POA: Diagnosis not present

## 2022-05-22 DIAGNOSIS — L570 Actinic keratosis: Secondary | ICD-10-CM | POA: Diagnosis not present

## 2022-05-22 DIAGNOSIS — D485 Neoplasm of uncertain behavior of skin: Secondary | ICD-10-CM | POA: Diagnosis not present

## 2022-05-22 DIAGNOSIS — D1800 Hemangioma unspecified site: Secondary | ICD-10-CM | POA: Diagnosis not present

## 2022-05-22 DIAGNOSIS — Z86018 Personal history of other benign neoplasm: Secondary | ICD-10-CM

## 2022-05-22 DIAGNOSIS — Z1283 Encounter for screening for malignant neoplasm of skin: Secondary | ICD-10-CM | POA: Diagnosis not present

## 2022-05-22 DIAGNOSIS — L814 Other melanin hyperpigmentation: Secondary | ICD-10-CM | POA: Diagnosis not present

## 2022-05-22 DIAGNOSIS — L821 Other seborrheic keratosis: Secondary | ICD-10-CM | POA: Diagnosis not present

## 2022-05-22 DIAGNOSIS — E039 Hypothyroidism, unspecified: Secondary | ICD-10-CM

## 2022-05-22 MED ORDER — LEVOTHYROXINE SODIUM 150 MCG PO TABS: 150.0000 ug | ORAL_TABLET | Freq: Every day | ORAL | 1 refills | Status: DC

## 2022-05-22 NOTE — Progress Notes (Signed)
Follow-Up Visit   Subjective  [name] is a 66 y.o. male who presents for the following: Total body skin exam, 57mf/u (Hx of Melanoma L lat infraclavicular, Hx of BCCs, hx of Dysplastic Nevi) and check spot (R tricep, 3d, itchy, otc anti itch cr). The patient presents for Total-Body Skin Exam (TBSE) for skin cancer screening and mole check.  The patient has spots, moles and lesions to be evaluated, some may be new or changing and the patient has concerns that these could be cancer.  Records reviewed today concerning Metastatic Melanoma evaluation and treatment.  The following portions of the chart were reviewed this encounter and updated as appropriate:   Tobacco  Allergies  Meds  Problems  Med Hx  Surg Hx  Fam Hx     Review of Systems:  No other skin or systemic complaints except as noted in HPI or Assessment and Plan.  Objective  Well appearing patient in no apparent distress; mood and affect are within normal limits.  A full examination was performed including scalp, head, eyes, ears, nose, lips, neck, chest, axillae, abdomen, back, buttocks, bilateral upper extremities, bilateral lower extremities, hands, feet, fingers, toes, fingernails, and toenails. All findings within normal limits unless otherwise noted below.  L lat infraclavicular Well healed scar with no evidence of recurrence, no lymphadenopathy.   Left Axilla Well healed scar with no evidence of recurrence, no lymphadenopathy.   Scalp x 6 (6) Pink scaly macules  R tricep Rough patch with surrounding hypopigmentation 2.5 x 2.0cm      Assessment & Plan   Lentigines - Scattered tan macules - Due to sun exposure - Benign-appearing, observe - Recommend daily broad spectrum sunscreen SPF 30+ to sun-exposed areas, reapply every 2 hours as needed. - Call for any changes  Seborrheic Keratoses - Stuck-on, waxy, tan-brown papules and/or plaques  - Benign-appearing - Discussed benign etiology and  prognosis. - Observe - Call for any changes  Melanocytic Nevi - Tan-brown and/or pink-flesh-colored symmetric macules and papules - Benign appearing on exam today - Observation - Call clinic for new or changing moles - Recommend daily use of broad spectrum spf 30+ sunscreen to sun-exposed areas.   Hemangiomas - Red papules - Discussed benign nature - Observe - Call for any changes  Actinic Damage - Chronic condition, secondary to cumulative UV/sun exposure - diffuse scaly erythematous macules with underlying dyspigmentation - Recommend daily broad spectrum sunscreen SPF 30+ to sun-exposed areas, reapply every 2 hours as needed.  - Staying in the shade or wearing long sleeves, sun glasses (UVA+UVB protection) and wide brim hats (4-inch brim around the entire circumference of the hat) are also recommended for sun protection.  - Call for new or changing lesions.  Skin cancer screening performed today.   History of melanoma L lat infraclavicular  History of Melanoma - Left lateral infraclavicular in February 2015; S/P Wide Local excision - clear with close follow up for past 7 years.  Patient met all recommended treatment protocol and follow up. Recent discovery of symptomatic lump in Left axilla showing Melanoma by biopsy. Skin is Clear today. Observe for recurrence. Call clinic for new or changing lesions.  Recommend regular skin exams, daily broad-spectrum spf 30+ sunscreen use, and photoprotection.    [place] Axillary excision per Dr BFleet Contras  Keytruda treatment suspended due to high TSH, pt has f/u next week and hoping to finish final treatments  PET scan does not show metastases anywhere other than  left axillary lymph nodes. MRI of brain is clear.  CT of Chest; abdomen and pelvis is clear except for Left axilla lymphadenopathy. He is being treated by Dr. Burlene Arnt with [name]   AK (actinic keratosis) (6) Scalp x 6 Destruction of lesion -  Scalp x 6 Complexity: simple   Destruction method: cryotherapy   Informed consent: discussed and consent obtained   Timeout:  patient name, date of birth, surgical site, and procedure verified Lesion destroyed using liquid nitrogen: Yes   Region frozen until ice ball extended beyond lesion: Yes   Outcome: patient tolerated procedure well with no complications   Post-procedure details: wound care instructions given    Related Medications fluorouracil (EFUDEX) 5 % cream Apply to scalp twice a day for 7 days,  Neoplasm of skin R tricep Skin / nail biopsy Type of biopsy: tangential   Informed consent: discussed and consent obtained   Anesthesia: the lesion was anesthetized in a standard fashion   Anesthesia comment:  Area prepped with alcohol Anesthetic:  1% lidocaine w/ epinephrine 1-100,000 buffered w/ 8.4% NaHCO3 Instrument used: flexible razor blade   Hemostasis achieved with: pressure, aluminum chloride and electrodesiccation   Outcome: patient tolerated procedure well   Post-procedure details: wound care instructions given   Post-procedure details comment:  Ointment and small bandage applied  Specimen 1 - Surgical pathology Differential Diagnosis: D48.5 ISK r/o CA Check Margins: No Rough patch with surrounding hypopigmentation 2.5 x 2.0cm  History of Basal Cell Carcinoma of the Skin - No evidence of recurrence today - Recommend regular full body skin exams - Recommend daily broad spectrum sunscreen SPF 30+ to sun-exposed areas, reapply every 2 hours as needed.  - Call if any new or changing lesions are noted between office visits  - multiple  History of Dysplastic Nevi - No evidence of recurrence today - Recommend regular full body skin exams - Recommend daily broad spectrum sunscreen SPF 30+ to sun-exposed areas, reapply every 2 hours as needed.  - Call if any new or changing lesions are noted between office visits  - multiple  Return in about 3 months (around  08/22/2022) for TBSE, Hx of Melanoma, Hx of BCC, Hx of Dysplastic nevi, Hx of AKs.  I, Othelia Pulling, RMA, am acting as scribe for Sarina Ser, MD .  Documentation: I have reviewed the above documentation for accuracy and completeness, and I agree with the above.  Sarina Ser, MD

## 2022-05-22 NOTE — Patient Instructions (Addendum)
Wound Care Instructions  Cleanse wound gently with soap and water once a day then pat dry with clean gauze. Apply a thin coat of Petrolatum (petroleum jelly, "Vaseline") over the wound (unless you have an allergy to this). We recommend that you use a new, sterile tube of Vaseline. Do not pick or remove scabs. Do not remove the yellow or white "healing tissue" from the base of the wound.  Cover the wound with fresh, clean, nonstick gauze and secure with paper tape. You may use Band-Aids in place of gauze and tape if the wound is small enough, but would recommend trimming much of the tape off as there is often too much. Sometimes Band-Aids can irritate the skin.  You should call the office for your biopsy report after 1 week if you have not already been contacted.  If you experience any problems, such as abnormal amounts of bleeding, swelling, significant bruising, significant pain, or evidence of infection, please call the office immediately.  FOR ADULT SURGERY PATIENTS: If you need something for pain relief you may take 1 extra strength Tylenol (acetaminophen) AND 2 Ibuprofen (200mg each) together every 4 hours as needed for pain. (do not take these if you are allergic to them or if you have a reason you should not take them.) Typically, you may only need pain medication for 1 to 3 days.     Cryotherapy Aftercare  Wash gently with soap and water everyday.   Apply Vaseline and Band-Aid daily until healed.    Due to recent changes in healthcare laws, you may see results of your pathology and/or laboratory studies on MyChart before the doctors have had a chance to review them. We understand that in some cases there may be results that are confusing or concerning to you. Please understand that not all results are received at the same time and often the doctors may need to interpret multiple results in order to provide you with the best plan of care or course of treatment. Therefore, we ask that you  please give us 2 business days to thoroughly review all your results before contacting the office for clarification. Should we see a critical lab result, you will be contacted sooner.   If You Need Anything After Your Visit  If you have any questions or concerns for your doctor, please call our main line at [phone] and press option 4 to reach your doctor's medical assistant. If no one answers, please leave a voicemail as directed and we will return your call as soon as possible. Messages left after 4 pm will be answered the following business day.   You may also send us a message via MyChart. We typically respond to MyChart messages within 1-2 business days.  For prescription refills, please ask your pharmacy to contact our office. Our fax number is [phone].  If you have an urgent issue when the clinic is closed that cannot wait until the next business day, you can page your doctor at the number below.    Please note that while we do our best to be available for urgent issues outside of office hours, we are not available 24/7.   If you have an urgent issue and are unable to reach us, you may choose to seek medical care at your doctor's office, retail clinic, urgent care center, or emergency room.  If you have a medical emergency, please immediately call 911 or go to the emergency department.  Pager Numbers  - Dr. Kowalski: [phone]  -   Dr. Moye: [phone]  - Dr. Stewart: [phone]  In the event of inclement weather, please call our main line at [phone] for an update on the status of any delays or closures.  Dermatology Medication Tips: Please keep the boxes that topical medications come in in order to help keep track of the instructions about where and how to use these. Pharmacies typically print the medication instructions only on the boxes and not directly on the medication tubes.   If your medication is too expensive, please contact our office at  [phone] option 4 or send us a message through MyChart.   We are unable to tell what your co-pay for medications will be in advance as this is different depending on your insurance coverage. However, we may be able to find a substitute medication at lower cost or fill out paperwork to get insurance to cover a needed medication.   If a prior authorization is required to get your medication covered by your insurance company, please allow us 1-2 business days to complete this process.  Drug prices often vary depending on where the prescription is filled and some pharmacies may offer cheaper prices.  The website www.goodrx.com contains coupons for medications through different pharmacies. The prices here do not account for what the cost may be with help from insurance (it may be cheaper with your insurance), but the website can give you the price if you did not use any insurance.  - You can print the associated coupon and take it with your prescription to the pharmacy.  - You may also stop by our office during regular business hours and pick up a GoodRx coupon card.  - If you need your prescription sent electronically to a different pharmacy, notify our office through [facility] MyChart or by phone at [phone] option 4.     Si Usted Necesita Algo Despus de Su Visita  Tambin puede enviarnos un mensaje a travs de MyChart. Por lo general respondemos a los mensajes de MyChart en el transcurso de 1 a 2 das hbiles.  Para renovar recetas, por favor pida a su farmacia que se ponga en contacto con nuestra oficina. Nuestro nmero de fax es el [phone].  Si tiene un asunto urgente cuando la clnica est cerrada y que no puede esperar hasta el siguiente da hbil, puede llamar/localizar a su doctor(a) al nmero que aparece a continuacin.   Por favor, tenga en cuenta que aunque hacemos todo lo posible para estar disponibles para asuntos urgentes fuera del horario de oficina, no estamos  disponibles las 24 horas del da, los 7 das de la semana.   Si tiene un problema urgente y no puede comunicarse con nosotros, puede optar por buscar atencin mdica  en el consultorio de su doctor(a), en una clnica privada, en un centro de atencin urgente o en una sala de emergencias.  Si tiene una emergencia mdica, por favor llame inmediatamente al 911 o vaya a la sala de emergencias.  Nmeros de bper  - Dr. Kowalski: [phone]  - Dra. Moye: [phone]  - Dra. Stewart: [phone]  En caso de inclemencias del tiempo, por favor llame a nuestra lnea principal al [phone] para una actualizacin sobre el estado de cualquier retraso o cierre.  Consejos para la medicacin en dermatologa: Por favor, guarde las cajas en las que vienen los medicamentos de uso tpico para ayudarle a seguir las instrucciones sobre dnde y cmo usarlos. Las farmacias generalmente imprimen las instrucciones del medicamento slo en las cajas y   no directamente en los tubos del medicamento.   Si su medicamento es muy caro, por favor, pngase en contacto con nuestra oficina llamando al [phone] y presione la opcin 4 o envenos un mensaje a travs de MyChart.   No podemos decirle cul ser su copago por los medicamentos por adelantado ya que esto es diferente dependiendo de la cobertura de su seguro. Sin embargo, es posible que podamos encontrar un medicamento sustituto a menor costo o llenar un formulario para que el seguro cubra el medicamento que se considera necesario.   Si se requiere una autorizacin previa para que su compaa de seguros cubra su medicamento, por favor permtanos de 1 a 2 das hbiles para completar este proceso.  Los precios de los medicamentos varan con frecuencia dependiendo del lugar de dnde se surte la receta y alguna farmacias pueden ofrecer precios ms baratos.  El sitio web www.goodrx.com tiene cupones para medicamentos de diferentes farmacias. Los precios aqu no  tienen en cuenta lo que podra costar con la ayuda del seguro (puede ser ms barato con su seguro), pero el sitio web puede darle el precio si no utiliz ningn seguro.  - Puede imprimir el cupn correspondiente y llevarlo con su receta a la farmacia.  - Tambin puede pasar por nuestra oficina durante el horario de atencin regular y recoger una tarjeta de cupones de GoodRx.  - Si necesita que su receta se enve electrnicamente a una farmacia diferente, informe a nuestra oficina a travs de MyChart de [facility] o por telfono llamando al [phone] y presione la opcin 4.  

## 2022-05-23 ENCOUNTER — Encounter: Payer: Self-pay | Admitting: Internal Medicine

## 2022-05-27 ENCOUNTER — Other Ambulatory Visit: Payer: Self-pay | Admitting: Internal Medicine

## 2022-05-27 ENCOUNTER — Other Ambulatory Visit: Payer: Self-pay

## 2022-05-27 ENCOUNTER — Telehealth: Payer: Self-pay

## 2022-05-27 ENCOUNTER — Telehealth: Payer: Self-pay | Admitting: Internal Medicine

## 2022-05-27 DIAGNOSIS — C439 Malignant melanoma of skin, unspecified: Secondary | ICD-10-CM

## 2022-05-27 DIAGNOSIS — L209 Atopic dermatitis, unspecified: Secondary | ICD-10-CM

## 2022-05-27 MED ORDER — MOMETASONE FUROATE 0.1 % EX CREA: TOPICAL_CREAM | CUTANEOUS | 0 refills | Status: DC

## 2022-05-27 NOTE — Progress Notes (Signed)
OFF PATHWAY REGIMEN - Melanoma and Other Skin Cancers  No Change  Continue With Treatment as Ordered.  Original Decision Date/Time: 08/02/2021 15:07   [id number]:Pembrolizumab 200 mg IV D1 q21 Days:   A cycle is every 21 days:     Pembrolizumab   **Always confirm dose/schedule in your pharmacy ordering system**  Patient Characteristics: Melanoma, Cutaneous/Unknown Primary, Regional (Nodal) Recurrence / In Transit Disease, Unresectable, Systemic Therapy Indicated, Other Mutations/Biomarkers, Other Disease Classification: Melanoma Disease Subtype: Cutaneous BRAF V600 Mutation Status: Other Mutations/Biomarkers Therapeutic Status: Regional (Nodal) Recurrence / In Transit Disease Type of Therapy: Systemic Therapy Indicated Mutation/Biomarker Type: Other Intent of Therapy: Curative Intent, Discussed with Patient

## 2022-05-27 NOTE — Telephone Encounter (Signed)
Spoke to patient on 8/18-TSH recently elevated with PCP at 11.   Patient currently started on higher dose of Synthroid as per PCP.   Await endocrinology evaluation as ordered.  Thanks, GB

## 2022-05-27 NOTE — Telephone Encounter (Signed)
Patient informed of pathology results 

## 2022-05-27 NOTE — Telephone Encounter (Signed)
-----   Message from Ralene Bathe, MD sent at 05/27/2022 11:12 AM EDT ----- Diagnosis Skin , right tricep SPONGIOTIC DERMATITIS, SEE DESCRIPTION  Most consistent with Eczema rash May send in Mometasone cream - use bid for 2 weeks to improve - disp 15g 0rf Recheck next visit

## 2022-05-28 ENCOUNTER — Encounter: Payer: Self-pay | Admitting: Dermatology

## 2022-05-28 ENCOUNTER — Inpatient Hospital Stay: Payer: Medicare HMO

## 2022-05-28 ENCOUNTER — Encounter: Payer: Self-pay | Admitting: Internal Medicine

## 2022-05-28 ENCOUNTER — Inpatient Hospital Stay (HOSPITAL_BASED_OUTPATIENT_CLINIC_OR_DEPARTMENT_OTHER): Payer: Medicare HMO | Admitting: Internal Medicine

## 2022-05-28 VITALS — BP 124/80 | HR 66 | Temp 96.9°F | Resp 16 | Ht 73.0 in | Wt 188.6 lb

## 2022-05-28 DIAGNOSIS — C439 Malignant melanoma of skin, unspecified: Secondary | ICD-10-CM

## 2022-05-28 DIAGNOSIS — Z79890 Hormone replacement therapy (postmenopausal): Secondary | ICD-10-CM | POA: Diagnosis not present

## 2022-05-28 DIAGNOSIS — Z801 Family history of malignant neoplasm of trachea, bronchus and lung: Secondary | ICD-10-CM | POA: Diagnosis not present

## 2022-05-28 DIAGNOSIS — C4359 Malignant melanoma of other part of trunk: Secondary | ICD-10-CM | POA: Diagnosis not present

## 2022-05-28 DIAGNOSIS — C773 Secondary and unspecified malignant neoplasm of axilla and upper limb lymph nodes: Secondary | ICD-10-CM | POA: Diagnosis not present

## 2022-05-28 DIAGNOSIS — C779 Secondary and unspecified malignant neoplasm of lymph node, unspecified: Secondary | ICD-10-CM

## 2022-05-28 DIAGNOSIS — E2740 Unspecified adrenocortical insufficiency: Secondary | ICD-10-CM | POA: Diagnosis not present

## 2022-05-28 DIAGNOSIS — R5383 Other fatigue: Secondary | ICD-10-CM | POA: Diagnosis not present

## 2022-05-28 DIAGNOSIS — Z7952 Long term (current) use of systemic steroids: Secondary | ICD-10-CM | POA: Diagnosis not present

## 2022-05-28 DIAGNOSIS — Z79899 Other long term (current) drug therapy: Secondary | ICD-10-CM | POA: Diagnosis not present

## 2022-05-28 DIAGNOSIS — Z5112 Encounter for antineoplastic immunotherapy: Secondary | ICD-10-CM | POA: Diagnosis not present

## 2022-05-28 DIAGNOSIS — E032 Hypothyroidism due to medicaments and other exogenous substances: Secondary | ICD-10-CM | POA: Diagnosis not present

## 2022-05-28 LAB — CBC WITH DIFFERENTIAL/PLATELET
Abs Immature Granulocytes: 0.02 10*3/uL (ref 0.00–0.07)
Basophils Absolute: 0.1 10*3/uL (ref 0.0–0.1)
Basophils Relative: 1 %
Eosinophils Absolute: 0.9 10*3/uL — ABNORMAL HIGH (ref 0.0–0.5)
Eosinophils Relative: 16 %
HCT: 41.7 % (ref 39.0–52.0)
Hemoglobin: 13.6 g/dL (ref 13.0–17.0)
Immature Granulocytes: 0 %
Lymphocytes Relative: 44 %
Lymphs Abs: 2.4 10*3/uL (ref 0.7–4.0)
MCH: 31.4 pg (ref 26.0–34.0)
MCHC: 32.6 g/dL (ref 30.0–36.0)
MCV: 96.3 fL (ref 80.0–100.0)
Monocytes Absolute: 0.4 10*3/uL (ref 0.1–1.0)
Monocytes Relative: 7 %
Neutro Abs: 1.8 10*3/uL (ref 1.7–7.7)
Neutrophils Relative %: 32 %
Platelets: 236 10*3/uL (ref 150–400)
RBC: 4.33 MIL/uL (ref 4.22–5.81)
RDW: 14.1 % (ref 11.5–15.5)
WBC: 5.5 10*3/uL (ref 4.0–10.5)
nRBC: 0 % (ref 0.0–0.2)

## 2022-05-28 LAB — COMPREHENSIVE METABOLIC PANEL
ALT: 23 U/L (ref 0–44)
AST: 25 U/L (ref 15–41)
Albumin: 3.8 g/dL (ref 3.5–5.0)
Alkaline Phosphatase: 55 U/L (ref 38–126)
Anion gap: 6 (ref 5–15)
BUN: 13 mg/dL (ref 8–23)
CO2: 25 mmol/L (ref 22–32)
Calcium: 8.9 mg/dL (ref 8.9–10.3)
Chloride: 110 mmol/L (ref 98–111)
Creatinine, Ser: 1.15 mg/dL (ref 0.61–1.24)
GFR, Estimated: >60 mL/min (ref >60)
Glucose, Bld: 98 mg/dL (ref 70–99)
Potassium: 3.8 mmol/L (ref 3.5–5.1)
Sodium: 141 mmol/L (ref 135–145)
Total Bilirubin: 0.6 mg/dL (ref 0.3–1.2)
Total Protein: 6.1 g/dL — ABNORMAL LOW (ref 6.5–8.1)

## 2022-05-28 LAB — TSH: TSH: 2.459 u[IU]/mL (ref 0.350–4.500)

## 2022-05-28 MED ORDER — SODIUM CHLORIDE 0.9 % IV SOLN
200.0000 mg | Freq: Once | INTRAVENOUS | Status: AC
  Administered 2022-05-28: 200 mg via INTRAVENOUS
  Filled 2022-05-28: qty 8

## 2022-05-28 MED ORDER — SODIUM CHLORIDE 0.9 % IV SOLN
Freq: Once | INTRAVENOUS | Status: AC
  Filled 2022-05-28: qty 250

## 2022-05-28 NOTE — Progress Notes (Signed)
Rash on right arm was biopsied by dermatology and diagnosed as eczema.    Should her receive flu immunization this year.

## 2022-05-28 NOTE — Progress Notes (Signed)
Nutrition  Received message that patient wanted to cancel nutrition appointment for today.    RD available if needed in the future.  [name] B. Zenia Resides, [place], [place] Registered Dietitian [phone]

## 2022-05-28 NOTE — Assessment & Plan Note (Addendum)
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  S/p neoadjuvant Keytruda x4- -Status post axillary lymph node dissection-with residual micrometastatic disease.  Patient currently on adjuvant Keytruda.  Tolerating with moderate-severe difficulties see below  # proceed with  [place] #12 today of planned total 18. Labs today reviewed-patient tolerating well/better see below.   #Iatrogenic hypothyroidism [AUG 2023; TSH- 11]-on Synthroid 150 mcg once a day.   # Adrenal insufficiency -extreme fatigue-extensive work-up including MRI brain-negative for any hypophysitis; however cortisol 0.8; with normal [facility] [AM]-currently on hydrocortsione 20 mg in the morning and 10 mg at night.  Currently improved.  Awaiting endocrinology evaluation.   # Upper Extremity numbness/pain  [Right > Left] in AM- s/p evaluation with Dr.Shah [neurology-JUNE 2023]. cervical spine MRI with and without contrast- degenerative; no evidence of cancer.  Currently improved on steroids.  Follow-up with neurology.  # Vaccination: Follow-up shingles vaccine at this time; okay to proceed with flu/COVID vaccination when available.  * Monday pref  # DISPOSITION:   # Keytruda today # cancel appt with Joli today # follow up in 3 weeks- MD; labs- cbc/cmp;Keytruda;Thyroid profile  Dr.B.

## 2022-05-28 NOTE — Progress Notes (Signed)
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   There is sufficient material for ancillary molecular testing if needed  (block A3, A1, A2).   Comment:  Per provided outside pathology report the patient had an "at least pT1b"  malignant melanoma of the left lateral infraclavicular skin in 2015.  Touch preparations demonstrate predominantly discohesive large  pleomorphic cells, some with binucleation.  HE sections demonstrate  lymph node parenchyma involved by metastatic neoplasm.  The neoplastic  cells are pleomorphic with binucleation, focal intranuclear inclusions,  and associated tumor necrosis. The neoplastic cells demonstrate the  following pattern of immunoreactivity:  S100: Positive  SOX10: Positive  HMB-45: Negative  Melan-A: Negative  Super pancytokeratin: Negative  CD45: Negative  This pattern of immunoreactivity supports the above diagnosis.   # NOV 7th, 2022-neoadjuvant Keytruda cycle #1  # JAN, 26th, [name],  majority of these foci measure 1-2 mm in greatest dimension, with the largest measuring 4 mm.  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  Accompanied by his wife.   [name] 66 y.o.  male pleasant patient above history of recurrent melanoma left axillary lymphadenopathy-s/p surgery is currently on adjuvant Keytruda-with iatrogenic hypothyroidism /adrenal insufficiency is here for follow-up.   Went on a hike over the weekend.   Patient is currently on hydrocortisone; Synthroid.  Notes to have improvement of his energy levels.  Denies any tingling or numbness.  No pain improved.  Currently awaiting evaluation with endocrinology.  Overall he feels much improved.  No nausea no vomiting.  He admits to compliance with his medications.  Review of Systems  Constitutional:  Negative for chills, diaphoresis, fever, malaise/fatigue and weight loss.  HENT:  Negative for nosebleeds and sore throat.   Eyes:  Negative for double vision.  Respiratory:  Negative for hemoptysis, sputum production, shortness of breath and wheezing.   Cardiovascular:  Negative for chest pain, palpitations, orthopnea and leg swelling.  Gastrointestinal:  Negative for abdominal pain, blood in stool, constipation, diarrhea, heartburn, melena, nausea and vomiting.  Genitourinary:  Negative for dysuria, frequency and urgency.  Musculoskeletal:  Positive for myalgias. Negative for back pain and joint pain.  Skin: Negative.  Negative for itching and rash.  Neurological:  Positive  for tingling. Negative for dizziness, focal weakness and weakness.  Endo/Heme/Allergies:  Does not bruise/bleed easily.  Psychiatric/Behavioral:  Negative for depression.  The patient is not nervous/anxious and does not have insomnia.       PAST MEDICAL HISTORY :  Past Medical History:  Diagnosis Date   Basal cell carcinoma 03/10/2013   Right medial forehead. Excised, margins free.   Dysplastic nevus 11/05/2006   Mid back paraspinal, [name], close to edge. Excised 12/24/2006, margins free.   Dysplastic nevus 12/15/2008   Left lat. pectoral area. Moderate atypia, extends to one edge.    Dysplastic nevus 12/07/2009   Left mid side. Moderate atypia, close to margin.   Dysplastic nevus 12/07/2009   Right posterior waistline. Moderate atypia, close to margin.   Dysplastic nevus 02/10/2014   Left paraspinal mid back. Moderate atypia, lateral and deep margin involved.    Dysplastic nevus 06/16/2014   Left epigastric. Mild atypia, deep margin involved.    GERD (gastroesophageal reflux disease)    Hx of basal cell carcinoma    multiple sites   Hx of malignant melanoma 11/11/2013   L lateral infraclavicular, superficial spreading, Breslow's 0.25m, Clark's level III   Hypothyroidism    [place]   Left lateral infraclavicular. MM, superficial spreading. Anatomic level III. Tumor thickness 0.677m Excised 11/24/2013, margins free.    [place]   L axilla, excised by Dr. ByFleet Contrasfollowd by Dr. BrBurlene Arntith KeBeryle Flock  PAST SURGICAL HISTORY :   Past Surgical History:  Procedure Laterality Date   AXILLARY LYMPH NODE DISSECTION Left 11/02/2021   Procedure: AXILLARY LYMPH NODE DISSECTION;  [name];  [name];  Service: General;  Laterality: Left;   COLONOSCOPY WITH PROPOFOL N/A 01/20/2017   Procedure: COLONOSCOPY WITH PROPOFOL;  [name];  [facility];  Service: Endoscopy;  [name];   ESOPHAGOGASTRODUODENOSCOPY (EGD) WITH ESOPHAGEAL DILATION     MELANOMA EXCISION Right 11/24/2013   lateral infraclavicular    WISDOM TOOTH EXTRACTION     WRIST SURGERY Left 2022    FAMILY HISTORY :   Family History  Problem Relation Age of Onset   Macular degeneration Mother    Hyperlipidemia Mother    Heart disease Father    Heart attack Father    Lung cancer Father        metastasized    SOCIAL HISTORY:   Social History   Tobacco Use   Smoking status: Never   Smokeless tobacco: Never  Vaping Use   Vaping Use: Never used  Substance Use Topics   Alcohol use: Yes    Comment: about 1-3 glass of wine with dinner   Drug use: No    ALLERGIES:  has No Known Allergies.  MEDICATIONS:  Current Outpatient Medications  Medication Sig Dispense Refill   aspirin 81 MG tablet Take 81 mg by mouth daily.     fluorouracil (EFUDEX) 5 % cream Apply to scalp twice a day for 7 days, 40 g 1   hydrocortisone (CORTEF) 10 MG tablet TAKE 2 PILLS IN AM; 1 PILL AT NIGHT. DO NOT STOP UNTIL DIRECTED. Take with food. 90 tablet 1   levothyroxine (SYNTHROID) 150 MCG tablet Take 1 tablet (150 mcg total) by mouth daily before breakfast. 90 tablet 1   mometasone (ELOCON) 0.1 % cream Apply to aa's rash BID up to two weeks. 15 g 0   Multiple Vitamins tablet Take  1 tablet by mouth daily.     Multiple Vitamins-Minerals (PRESERVISION AREDS 2 PO) Take 1 capsule by mouth in the morning and at bedtime.     pantoprazole (PROTONIX) 20 MG tablet Take 20 mg by mouth daily.  0   Pembrolizumab (KEYTRUDA IV) Inject 1 Dose into the vein every 21 ( twenty-one) days.     No current facility-administered medications for this visit.    PHYSICAL EXAMINATION: ECOG PERFORMANCE STATUS: 0 - Asymptomatic  BP 124/80 (BP Location: Right Arm, Patient Position: Sitting)   Pulse 66   Temp (!) 96.9 F (36.1 C) (Tympanic)   Resp 16   Ht '6\' 1"'$  (1.854 m)   Wt 188 lb 9.6 oz (85.5 kg)   BMI 24.88 kg/m   Filed Weights   05/28/22 0919  Weight: 188 lb 9.6 oz (85.5 kg)     Positive for lymph node left underarm approximate 2 to 3 cm in  size.  Physical Exam Vitals and nursing note reviewed.  HENT:     Head: Normocephalic and atraumatic.     Mouth/Throat:     Pharynx: Oropharynx is clear.  Eyes:     Extraocular Movements: Extraocular movements intact.     Pupils: Pupils are equal, round, and reactive to light.  Cardiovascular:     Rate and Rhythm: Normal rate and regular rhythm.  Pulmonary:     Comments: Decreased breath sounds bilaterally.  Abdominal:     Palpations: Abdomen is soft.  Musculoskeletal:        General: Normal range of motion.     Cervical back: Normal range of motion.  Skin:    General: Skin is warm.  Neurological:     General: No focal deficit present.     Mental Status: He is alert and oriented to person, place, and time.  Psychiatric:        Behavior: Behavior normal.        Judgment: Judgment normal.        LABORATORY DATA:  I have reviewed the data as listed    Component Value Date/Time   NA 141 05/28/2022 0914   NA 144 05/16/2022 0953   K 3.8 05/28/2022 0914   CL 110 05/28/2022 0914   CO2 25 05/28/2022 0914   GLUCOSE 98 05/28/2022 0914   BUN 13 05/28/2022 0914   BUN 12 05/16/2022 0953   CREATININE 1.15 05/28/2022 0914   CALCIUM 8.9 05/28/2022 0914   PROT 6.1 (L) 05/28/2022 0914   PROT 5.7 (L) 05/16/2022 0953   ALBUMIN 3.8 05/28/2022 0914   ALBUMIN 3.9 05/16/2022 0953   AST 25 05/28/2022 0914   ALT 23 05/28/2022 0914   ALKPHOS 55 05/28/2022 0914   BILITOT 0.6 05/28/2022 0914   BILITOT 0.5 05/16/2022 0953   GFRNONAA >60 05/28/2022 0914   GFRAA 75 05/10/2020 1017    No results found for: "SPEP", "UPEP"  Lab Results  Component Value Date   WBC 5.5 05/28/2022   NEUTROABS 1.8 05/28/2022   HGB 13.6 05/28/2022   HCT 41.7 05/28/2022   MCV 96.3 05/28/2022   PLT 236 05/28/2022      Chemistry      Component Value Date/Time   NA 141 05/28/2022 0914   NA 144 05/16/2022 0953   K 3.8 05/28/2022 0914   CL 110 05/28/2022 0914   CO2 25 05/28/2022 0914   BUN 13  05/28/2022 0914   BUN 12 05/16/2022 0953   CREATININE 1.15 05/28/2022 0914   GLU 96 10/19/2014  0000      Component Value Date/Time   CALCIUM 8.9 05/28/2022 0914   ALKPHOS 55 05/28/2022 0914   AST 25 05/28/2022 0914   ALT 23 05/28/2022 0914   BILITOT 0.6 05/28/2022 0914   BILITOT 0.5 05/16/2022 0953       RADIOGRAPHIC STUDIES: I have personally reviewed the radiological images as listed and agreed with the findings in the report. No results found.   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  S/p neoadjuvant Keytruda x4- -Status post axillary lymph node dissection-with residual micrometastatic disease.  Patient currently on adjuvant Keytruda.  Tolerating with moderate-severe difficulties see below  # proceed with  [place] #12 today of planned total 18. Labs today reviewed-patient tolerating well/better see below.   #Iatrogenic hypothyroidism [AUG 2023; TSH- 11]-on Synthroid 150 mcg once a day.   # Adrenal insufficiency -extreme fatigue-extensive work-up including MRI brain-negative for any hypophysitis; however cortisol 0.8; with normal [facility] [AM]-currently on hydrocortsione 20 mg in the morning and 10 mg at night.  Currently improved.  Awaiting endocrinology evaluation.   # Upper Extremity numbness/pain  [Right > Left] in AM- s/p evaluation with Dr.Shah [neurology-JUNE 2023]. cervical spine MRI with and without contrast- degenerative; no evidence of cancer.  Currently improved on steroids.  Follow-up with neurology.  # Vaccination: Follow-up shingles vaccine at this time; okay to proceed with flu/COVID vaccination when available.  * Monday pref  # DISPOSITION:   # Keytruda today # cancel appt with Joli today # follow up in 3 weeks- MD; labs- cbc/cmp;Keytruda;Thyroid profile  Dr.B.        Orders Placed This Encounter  Procedures   Thyroid Panel With TSH    Standing Status:    Future    Standing Expiration Date:   05/28/2023    All questions were answered. The patient knows to call the clinic with any problems, questions or concerns.      Cammie Sickle, MD 05/28/2022 1:18 PM

## 2022-05-29 LAB — T4: T4, Total: 8.4 ug/dL (ref 4.5–12.0)

## 2022-06-12 DIAGNOSIS — M5033 Other cervical disc degeneration, cervicothoracic region: Secondary | ICD-10-CM | POA: Diagnosis not present

## 2022-06-12 DIAGNOSIS — M9903 Segmental and somatic dysfunction of lumbar region: Secondary | ICD-10-CM | POA: Diagnosis not present

## 2022-06-12 DIAGNOSIS — M9901 Segmental and somatic dysfunction of cervical region: Secondary | ICD-10-CM | POA: Diagnosis not present

## 2022-06-12 DIAGNOSIS — M62830 Muscle spasm of back: Secondary | ICD-10-CM | POA: Diagnosis not present

## 2022-06-18 ENCOUNTER — Encounter: Payer: Self-pay | Admitting: Internal Medicine

## 2022-06-18 ENCOUNTER — Inpatient Hospital Stay: Payer: Medicare HMO | Attending: Oncology

## 2022-06-18 ENCOUNTER — Inpatient Hospital Stay (HOSPITAL_BASED_OUTPATIENT_CLINIC_OR_DEPARTMENT_OTHER): Payer: Medicare HMO | Admitting: Internal Medicine

## 2022-06-18 ENCOUNTER — Inpatient Hospital Stay: Payer: Medicare HMO

## 2022-06-18 VITALS — BP 141/82 | HR 63 | Temp 96.1°F | Ht 73.0 in | Wt 187.4 lb

## 2022-06-18 DIAGNOSIS — E2740 Unspecified adrenocortical insufficiency: Secondary | ICD-10-CM | POA: Insufficient documentation

## 2022-06-18 DIAGNOSIS — C439 Malignant melanoma of skin, unspecified: Secondary | ICD-10-CM | POA: Diagnosis not present

## 2022-06-18 DIAGNOSIS — C779 Secondary and unspecified malignant neoplasm of lymph node, unspecified: Secondary | ICD-10-CM

## 2022-06-18 DIAGNOSIS — Z79899 Other long term (current) drug therapy: Secondary | ICD-10-CM | POA: Insufficient documentation

## 2022-06-18 DIAGNOSIS — E032 Hypothyroidism due to medicaments and other exogenous substances: Secondary | ICD-10-CM | POA: Diagnosis not present

## 2022-06-18 DIAGNOSIS — Z5112 Encounter for antineoplastic immunotherapy: Secondary | ICD-10-CM | POA: Insufficient documentation

## 2022-06-18 DIAGNOSIS — C773 Secondary and unspecified malignant neoplasm of axilla and upper limb lymph nodes: Secondary | ICD-10-CM | POA: Diagnosis not present

## 2022-06-18 DIAGNOSIS — C4359 Malignant melanoma of other part of trunk: Secondary | ICD-10-CM | POA: Insufficient documentation

## 2022-06-18 LAB — CBC WITH DIFFERENTIAL/PLATELET
Abs Immature Granulocytes: 0.01 10*3/uL (ref 0.00–0.07)
Basophils Absolute: 0.1 10*3/uL (ref 0.0–0.1)
Basophils Relative: 1 %
Eosinophils Absolute: 0.4 10*3/uL (ref 0.0–0.5)
Eosinophils Relative: 7 %
HCT: 42.7 % (ref 39.0–52.0)
Hemoglobin: 14.1 g/dL (ref 13.0–17.0)
Immature Granulocytes: 0 %
Lymphocytes Relative: 41 %
Lymphs Abs: 2.4 10*3/uL (ref 0.7–4.0)
MCH: 30.7 pg (ref 26.0–34.0)
MCHC: 33 g/dL (ref 30.0–36.0)
MCV: 93 fL (ref 80.0–100.0)
Monocytes Absolute: 0.5 10*3/uL (ref 0.1–1.0)
Monocytes Relative: 8 %
Neutro Abs: 2.5 10*3/uL (ref 1.7–7.7)
Neutrophils Relative %: 43 %
Platelets: 191 10*3/uL (ref 150–400)
RBC: 4.59 MIL/uL (ref 4.22–5.81)
RDW: 13.4 % (ref 11.5–15.5)
WBC: 5.9 10*3/uL (ref 4.0–10.5)
nRBC: 0 % (ref 0.0–0.2)

## 2022-06-18 LAB — COMPREHENSIVE METABOLIC PANEL
ALT: 13 U/L (ref 0–44)
AST: 20 U/L (ref 15–41)
Albumin: 3.7 g/dL (ref 3.5–5.0)
Alkaline Phosphatase: 46 U/L (ref 38–126)
Anion gap: 6 (ref 5–15)
BUN: 15 mg/dL (ref 8–23)
CO2: 24 mmol/L (ref 22–32)
Calcium: 8.8 mg/dL — ABNORMAL LOW (ref 8.9–10.3)
Chloride: 109 mmol/L (ref 98–111)
Creatinine, Ser: 1.05 mg/dL (ref 0.61–1.24)
GFR, Estimated: >60 mL/min (ref >60)
Glucose, Bld: 97 mg/dL (ref 70–99)
Potassium: 3.5 mmol/L (ref 3.5–5.1)
Sodium: 139 mmol/L (ref 135–145)
Total Bilirubin: 0.5 mg/dL (ref 0.3–1.2)
Total Protein: 5.7 g/dL — ABNORMAL LOW (ref 6.5–8.1)

## 2022-06-18 MED ORDER — SODIUM CHLORIDE 0.9 % IV SOLN
Freq: Once | INTRAVENOUS | Status: AC
  Filled 2022-06-18: qty 250

## 2022-06-18 MED ORDER — SODIUM CHLORIDE 0.9 % IV SOLN
200.0000 mg | Freq: Once | INTRAVENOUS | Status: AC
  Administered 2022-06-18: 200 mg via INTRAVENOUS
  Filled 2022-06-18: qty 8

## 2022-06-18 NOTE — Assessment & Plan Note (Addendum)
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  S/p neoadjuvant Keytruda x4- -Status post axillary lymph node dissection-with residual micrometastatic disease.  Patient currently on adjuvant Keytruda.  Tolerating with moderate-severe difficulties see below  # proceed with  [place] #13 today of planned total 18. Labs today reviewed-patient tolerating well/better see below.   #Iatrogenic hypothyroidism [AUG 2023; TSH- 11]-on Synthroid 150 mcg once a day. - STABLE.   # Adrenal insufficiency -extreme fatigue-extensive work-up including MRI brain-negative for any hypophysitis; however cortisol 0.8; with normal [facility] [AM]-currently on hydrocortsione 20 mg in the morning and 10 mg at night.  Currently improved.  Awaiting endocrinology evaluation.   # Upper Extremity numbness/pain  [Right > Left] in AM- s/p evaluation with Dr.Shah [neurology-JUNE 2023]. cervical spine MRI with and without contrast- degenerative; no evidence of cancer.  Currently improved on steroids.  Awaiting reevaluation with Dr. Manuella Ghazi neurology.  STABLE.   * Monday pref  # DISPOSITION:   # Keytruda today # follow up in 3 weeks- MD; labs- cbc/cmp;Keytruda-  Dr.B.

## 2022-06-18 NOTE — Progress Notes (Signed)
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   There is sufficient material for ancillary molecular testing if needed  (block A3, A1, A2).   Comment:  Per provided outside pathology report the patient had an "at least pT1b"  malignant melanoma of the left lateral infraclavicular skin in 2015.  Touch preparations demonstrate predominantly discohesive large  pleomorphic cells, some with binucleation.  HE sections demonstrate  lymph node parenchyma involved by metastatic neoplasm.  The neoplastic  cells are pleomorphic with binucleation, focal intranuclear inclusions,  and associated tumor necrosis. The neoplastic cells demonstrate the  following pattern of immunoreactivity:  S100: Positive  SOX10: Positive  HMB-45: Negative  Melan-A: Negative  Super pancytokeratin: Negative  CD45: Negative  This pattern of immunoreactivity supports the above diagnosis.   # NOV 7th, 2022-neoadjuvant Keytruda cycle #1  # JAN, 26th, [name],  majority of these foci measure 1-2 mm in greatest dimension, with the largest measuring 4 mm.  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  Accompanied by his wife.   [name] 66 y.o.  male pleasant patient above history of recurrent melanoma left axillary lymphadenopathy-s/p surgery is currently on adjuvant Keytruda-with iatrogenic hypothyroidism /adrenal insufficiency is here for follow-up.    Patient continues to be on hydrocortisone; Synthroid.  Notes to have improvement of his energy levels.  Denies any tingling or numbness.  No pain improved.  Currently still awaiting evaluation with endocrinology.  Overall he feels much improved.  Feels that he is back to his baseline.  No nausea no vomiting.  He admits to compliance with his medications.  Review of Systems  Constitutional:  Negative for chills, diaphoresis, fever, malaise/fatigue and weight loss.  HENT:  Negative for nosebleeds and sore throat.   Eyes:  Negative for double vision.  Respiratory:  Negative for hemoptysis, sputum production, shortness of breath and wheezing.   Cardiovascular:  Negative for chest pain, palpitations, orthopnea and leg swelling.  Gastrointestinal:  Negative for abdominal pain, blood in stool, constipation, diarrhea, heartburn, melena, nausea and vomiting.  Genitourinary:  Negative for dysuria, frequency and urgency.  Musculoskeletal:  Positive for myalgias. Negative for back pain and joint pain.  Skin: Negative.  Negative for itching and rash.  Neurological:  Positive for tingling. Negative for dizziness, focal weakness and weakness.  Endo/Heme/Allergies:  Does not bruise/bleed easily.  Psychiatric/Behavioral:  Negative for  depression. The patient is not nervous/anxious and does not have insomnia.       PAST MEDICAL HISTORY :  Past Medical History:  Diagnosis Date   Basal cell carcinoma 03/10/2013   Right medial forehead. Excised, margins free.   Dysplastic nevus 11/05/2006   Mid back paraspinal, [name], close to edge. Excised 12/24/2006, margins free.   Dysplastic nevus 12/15/2008   Left lat. pectoral area. Moderate atypia, extends to one edge.    Dysplastic nevus 12/07/2009   Left mid side. Moderate atypia, close to margin.   Dysplastic nevus 12/07/2009   Right posterior waistline. Moderate atypia, close to margin.   Dysplastic nevus 02/10/2014   Left paraspinal mid back. Moderate atypia, lateral and deep margin involved.    Dysplastic nevus 06/16/2014   Left epigastric. Mild atypia, deep margin involved.    GERD (gastroesophageal reflux disease)    Hx of basal cell carcinoma    multiple sites   Hx of malignant melanoma 11/11/2013   L lateral infraclavicular, superficial spreading, Breslow's 0.41m, Clark's level III   Hypothyroidism    [place]   Left lateral infraclavicular. MM, superficial spreading. Anatomic level III. Tumor thickness 0.662m Excised 11/24/2013, margins free.    [place]   L axilla, excised by Dr. ByFleet Contrasfollowd by Dr. BrBurlene Arntith KeBeryle Flock  PAST SURGICAL HISTORY :   Past Surgical History:  Procedure Laterality Date   AXILLARY LYMPH NODE DISSECTION Left 11/02/2021   Procedure: AXILLARY LYMPH NODE DISSECTION;  [name];  [name];  Service: General;  Laterality: Left;   COLONOSCOPY WITH PROPOFOL N/A 01/20/2017   Procedure: COLONOSCOPY WITH PROPOFOL;  [name];  [facility];  Service: Endoscopy;  [name];   ESOPHAGOGASTRODUODENOSCOPY (EGD) WITH ESOPHAGEAL DILATION     MELANOMA EXCISION Right 11/24/2013   lateral  infraclavicular   WISDOM TOOTH EXTRACTION     WRIST SURGERY Left 2022    FAMILY HISTORY :   Family History  Problem Relation Age of Onset   Macular degeneration Mother    Hyperlipidemia Mother    Heart disease Father    Heart attack Father    Lung cancer Father        metastasized    SOCIAL HISTORY:   Social History   Tobacco Use   Smoking status: Never   Smokeless tobacco: Never  Vaping Use   Vaping Use: Never used  Substance Use Topics   Alcohol use: Yes    Comment: about 1-3 glass of wine with dinner   Drug use: No    ALLERGIES:  has No Known Allergies.  MEDICATIONS:  Current Outpatient Medications  Medication Sig Dispense Refill   aspirin 81 MG tablet Take 81 mg by mouth daily.     fluorouracil (EFUDEX) 5 % cream Apply to scalp twice a day for 7 days, 40 g 1   hydrocortisone (CORTEF) 10 MG tablet TAKE 2 PILLS IN AM; 1 PILL AT NIGHT. DO NOT STOP UNTIL DIRECTED. Take with food. 90 tablet 1   levothyroxine (SYNTHROID) 150 MCG tablet Take 1 tablet (150 mcg total) by mouth daily before breakfast. 90 tablet 1   mometasone (ELOCON) 0.1 % cream Apply to aa's rash BID up to two weeks. 15 g 0   Multiple  Vitamins tablet Take 1 tablet by mouth daily.     Multiple Vitamins-Minerals (PRESERVISION AREDS 2 PO) Take 1 capsule by mouth in the morning and at bedtime.     pantoprazole (PROTONIX) 20 MG tablet Take 20 mg by mouth daily.  0   Pembrolizumab (KEYTRUDA IV) Inject 1 Dose into the vein every 21 ( twenty-one) days.     No current facility-administered medications for this visit.    PHYSICAL EXAMINATION: ECOG PERFORMANCE STATUS: 0 - Asymptomatic  BP (!) 141/82 (BP Location: Right Arm, Patient Position: Sitting, Cuff Size: Normal)   Pulse 63   Temp (!) 96.1 F (35.6 C) (Tympanic)   Ht '6\' 1"'$  (1.854 m)   Wt 187 lb 6.4 oz (85 kg)   SpO2 99%   BMI 24.72 kg/m   Filed Weights   06/18/22 0903  Weight: 187 lb 6.4 oz (85 kg)     Positive for lymph node left underarm  approximate 2 to 3 cm in size.  Physical Exam Vitals and nursing note reviewed.  HENT:     Head: Normocephalic and atraumatic.     Mouth/Throat:     Pharynx: Oropharynx is clear.  Eyes:     Extraocular Movements: Extraocular movements intact.     Pupils: Pupils are equal, round, and reactive to light.  Cardiovascular:     Rate and Rhythm: Normal rate and regular rhythm.  Pulmonary:     Comments: Decreased breath sounds bilaterally.  Abdominal:     Palpations: Abdomen is soft.  Musculoskeletal:        General: Normal range of motion.     Cervical back: Normal range of motion.  Skin:    General: Skin is warm.  Neurological:     General: No focal deficit present.     Mental Status: He is alert and oriented to person, place, and time.  Psychiatric:        Behavior: Behavior normal.        Judgment: Judgment normal.        LABORATORY DATA:  I have reviewed the data as listed    Component Value Date/Time   NA 139 06/18/2022 0903   NA 144 05/16/2022 0953   K 3.5 06/18/2022 0903   CL 109 06/18/2022 0903   CO2 24 06/18/2022 0903   GLUCOSE 97 06/18/2022 0903   BUN 15 06/18/2022 0903   BUN 12 05/16/2022 0953   CREATININE 1.05 06/18/2022 0903   CALCIUM 8.8 (L) 06/18/2022 0903   PROT 5.7 (L) 06/18/2022 0903   PROT 5.7 (L) 05/16/2022 0953   ALBUMIN 3.7 06/18/2022 0903   ALBUMIN 3.9 05/16/2022 0953   AST 20 06/18/2022 0903   ALT 13 06/18/2022 0903   ALKPHOS 46 06/18/2022 0903   BILITOT 0.5 06/18/2022 0903   BILITOT 0.5 05/16/2022 0953   GFRNONAA >60 06/18/2022 0903   GFRAA 75 05/10/2020 1017    No results found for: "SPEP", "UPEP"  Lab Results  Component Value Date   WBC 5.9 06/18/2022   NEUTROABS 2.5 06/18/2022   HGB 14.1 06/18/2022   HCT 42.7 06/18/2022   MCV 93.0 06/18/2022   PLT 191 06/18/2022      Chemistry      Component Value Date/Time   NA 139 06/18/2022 0903   NA 144 05/16/2022 0953   K 3.5 06/18/2022 0903   CL 109 06/18/2022 0903   CO2 24  06/18/2022 0903   BUN 15 06/18/2022 0903   BUN 12 05/16/2022 0953   CREATININE  1.05 06/18/2022 0903   GLU 96 10/19/2014 0000      Component Value Date/Time   CALCIUM 8.8 (L) 06/18/2022 0903   ALKPHOS 46 06/18/2022 0903   AST 20 06/18/2022 0903   ALT 13 06/18/2022 0903   BILITOT 0.5 06/18/2022 0903   BILITOT 0.5 05/16/2022 0953       RADIOGRAPHIC STUDIES: I have personally reviewed the radiological images as listed and agreed with the findings in the report. No results found.   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  S/p neoadjuvant Keytruda x4- -Status post axillary lymph node dissection-with residual micrometastatic disease.  Patient currently on adjuvant Keytruda.  Tolerating with moderate-severe difficulties see below  # proceed with  [place] #13 today of planned total 18. Labs today reviewed-patient tolerating well/better see below.   #Iatrogenic hypothyroidism [AUG 2023; TSH- 11]-on Synthroid 150 mcg once a day. - STABLE.   # Adrenal insufficiency -extreme fatigue-extensive work-up including MRI brain-negative for any hypophysitis; however cortisol 0.8; with normal [facility] [AM]-currently on hydrocortsione 20 mg in the morning and 10 mg at night.  Currently improved.  Awaiting endocrinology evaluation.   # Upper Extremity numbness/pain  [Right > Left] in AM- s/p evaluation with Dr.Shah [neurology-JUNE 2023]. cervical spine MRI with and without contrast- degenerative; no evidence of cancer.  Currently improved on steroids.  Awaiting reevaluation with Dr. Manuella Ghazi neurology.  STABLE.   * Monday pref  # DISPOSITION:   # Keytruda today # follow up in 3 weeks- MD; labs- cbc/cmp;Keytruda-  Dr.B.        Orders Placed This Encounter  Procedures   CBC with Differential    Standing Status:   Future    Standing Expiration Date:   07/10/2023   Comprehensive metabolic panel    Standing  Status:   Future    Standing Expiration Date:   07/10/2023    All questions were answered. The patient knows to call the clinic with any problems, questions or concerns.      Cammie Sickle, MD 06/18/2022 1:03 PM

## 2022-06-18 NOTE — Patient Instructions (Signed)
[facility]  Discharge Instructions: Thank you for choosing [facility] to provide your oncology and hematology care.  If you have a lab appointment with the Cancer Center, please go directly to the Cancer Center and check in at the registration area.  Wear comfortable clothing and clothing appropriate for easy access to any Portacath or PICC line.   We strive to give you quality time with your provider. You may need to reschedule your appointment if you arrive late (15 or more minutes).  Arriving late affects you and other patients whose appointments are after yours.  Also, if you miss three or more appointments without notifying the office, you may be dismissed from the clinic at the provider's discretion.      For prescription refill requests, have your pharmacy contact our office and allow 72 hours for refills to be completed.    Today you received the following chemotherapy and/or immunotherapy agents Keytruda      To help prevent nausea and vomiting after your treatment, we encourage you to take your nausea medication as directed.  BELOW ARE SYMPTOMS THAT SHOULD BE REPORTED IMMEDIATELY: *FEVER GREATER THAN 100.4 F (38 C) OR HIGHER *CHILLS OR SWEATING *NAUSEA AND VOMITING THAT IS NOT CONTROLLED WITH YOUR NAUSEA MEDICATION *UNUSUAL SHORTNESS OF BREATH *UNUSUAL BRUISING OR BLEEDING *URINARY PROBLEMS (pain or burning when urinating, or frequent urination) *BOWEL PROBLEMS (unusual diarrhea, constipation, pain near the anus) TENDERNESS IN MOUTH AND THROAT WITH OR WITHOUT PRESENCE OF ULCERS (sore throat, sores in mouth, or a toothache) UNUSUAL RASH, SWELLING OR PAIN  UNUSUAL VAGINAL DISCHARGE OR ITCHING   Items with * indicate a potential emergency and should be followed up as soon as possible or go to the Emergency Department if any problems should occur.  Please show the CHEMOTHERAPY ALERT CARD or IMMUNOTHERAPY ALERT CARD at check-in to  the Emergency Department and triage nurse.  Should you have questions after your visit or need to cancel or reschedule your appointment, please contact [facility] [place]  [phone] and follow the prompts.  Office hours are 8:00 a.m. to 4:30 p.m. Monday - Friday. Please note that voicemails left after 4:00 p.m. may not be returned until the following business day.  We are closed weekends and major holidays. You have access to a nurse at all times for urgent questions. Please call the main number to the clinic [phone] and follow the prompts.  For any non-urgent questions, you may also contact your provider using MyChart. We now offer e-Visits for anyone 18 and older to request care online for non-urgent symptoms. For details visit [email or web address].   Also download the MyChart app! Go to the app store, search "MyChart", open the app, select [facility], and log in with your MyChart username and password.  Masks are optional in the cancer centers. If you would like for your care team to wear a mask while they are taking care of you, please let them know. For doctor visits, patients may have with them one support person who is at least 68 years old. At this time, visitors are not allowed in the infusion area.   

## 2022-06-18 NOTE — Progress Notes (Signed)
No concerns. 

## 2022-06-19 LAB — THYROID PANEL WITH TSH
Free Thyroxine Index: 2.4 (ref 1.2–4.9)
T3 Uptake Ratio: 31 % (ref 24–39)
T4, Total: 7.8 ug/dL (ref 4.5–12.0)
TSH: 0.15 u[IU]/mL — ABNORMAL LOW (ref 0.450–4.500)

## 2022-06-19 NOTE — Progress Notes (Signed)
Hi-wanted to inform you that your thyroid markers are slightly abnormal.  I would defer to the endocrinology for further titration of your thyroid levels.  Hopefully, you will be able to get an appointment with endocrinology soon.   Thanks, GB

## 2022-06-20 ENCOUNTER — Encounter: Payer: Self-pay | Admitting: Internal Medicine

## 2022-06-23 ENCOUNTER — Encounter: Payer: Self-pay | Admitting: Family Medicine

## 2022-06-24 NOTE — Telephone Encounter (Signed)
Please send in Synthroid  125 mcg daily and stop 150. thx

## 2022-06-26 ENCOUNTER — Other Ambulatory Visit: Payer: Self-pay

## 2022-06-26 DIAGNOSIS — E039 Hypothyroidism, unspecified: Secondary | ICD-10-CM

## 2022-06-26 MED ORDER — LEVOTHYROXINE SODIUM 125 MCG PO TABS: 125.0000 ug | ORAL_TABLET | Freq: Every day | ORAL | 3 refills | Status: DC

## 2022-07-01 ENCOUNTER — Other Ambulatory Visit: Payer: Self-pay | Admitting: Internal Medicine

## 2022-07-02 DIAGNOSIS — G4733 Obstructive sleep apnea (adult) (pediatric): Secondary | ICD-10-CM | POA: Diagnosis not present

## 2022-07-02 DIAGNOSIS — C779 Secondary and unspecified malignant neoplasm of lymph node, unspecified: Secondary | ICD-10-CM | POA: Diagnosis not present

## 2022-07-02 DIAGNOSIS — C439 Malignant melanoma of skin, unspecified: Secondary | ICD-10-CM | POA: Diagnosis not present

## 2022-07-02 DIAGNOSIS — G629 Polyneuropathy, unspecified: Secondary | ICD-10-CM | POA: Diagnosis not present

## 2022-07-02 DIAGNOSIS — E2740 Unspecified adrenocortical insufficiency: Secondary | ICD-10-CM | POA: Diagnosis not present

## 2022-07-02 DIAGNOSIS — Z8639 Personal history of other endocrine, nutritional and metabolic disease: Secondary | ICD-10-CM | POA: Diagnosis not present

## 2022-07-03 DIAGNOSIS — Z7952 Long term (current) use of systemic steroids: Secondary | ICD-10-CM | POA: Diagnosis not present

## 2022-07-03 DIAGNOSIS — E039 Hypothyroidism, unspecified: Secondary | ICD-10-CM | POA: Diagnosis not present

## 2022-07-03 DIAGNOSIS — E2740 Unspecified adrenocortical insufficiency: Secondary | ICD-10-CM | POA: Diagnosis not present

## 2022-07-03 DIAGNOSIS — C439 Malignant melanoma of skin, unspecified: Secondary | ICD-10-CM | POA: Diagnosis not present

## 2022-07-04 ENCOUNTER — Inpatient Hospital Stay (HOSPITAL_BASED_OUTPATIENT_CLINIC_OR_DEPARTMENT_OTHER): Payer: Medicare HMO | Admitting: Hospice and Palliative Medicine

## 2022-07-04 ENCOUNTER — Encounter: Payer: Self-pay | Admitting: Internal Medicine

## 2022-07-04 ENCOUNTER — Telehealth: Payer: Self-pay | Admitting: *Deleted

## 2022-07-04 DIAGNOSIS — Z03818 Encounter for observation for suspected exposure to other biological agents ruled out: Secondary | ICD-10-CM | POA: Diagnosis not present

## 2022-07-04 DIAGNOSIS — C779 Secondary and unspecified malignant neoplasm of lymph node, unspecified: Secondary | ICD-10-CM

## 2022-07-04 DIAGNOSIS — J069 Acute upper respiratory infection, unspecified: Secondary | ICD-10-CM | POA: Diagnosis not present

## 2022-07-04 DIAGNOSIS — C439 Malignant melanoma of skin, unspecified: Secondary | ICD-10-CM | POA: Diagnosis not present

## 2022-07-04 DIAGNOSIS — Z20822 Contact with and (suspected) exposure to covid-19: Secondary | ICD-10-CM | POA: Diagnosis not present

## 2022-07-04 NOTE — Telephone Encounter (Signed)
Received mychart msg from pt's wife. Pt C/o cold like symptoms and sore throat. Fevers of 101.2 this morning. [name] took tylenol and temp went down to 100.2.  covid home test result- negative. Pt has no other symptoms.  RN spoke with [name],NP, who will do a mychart visit today w/[name] to discuss symptoms. Provider will order Covid testing/flu testing pcr at [facility]. Pt wife contacted and plan of care reviewed. She will come pick up script for the covid testing. Mychart visit sch. At 130 pm today.  Dr. Jacinto Reap- [name] had contacted his endocrinologist yesterday. He reported cold like symptoms to endocrinology. Per wife, endocrinologist told pt to double up on the cortef dosing and [name] took 2 extra tablets this morning in hopes to improve his cold symptoms.  Please advise as [name] is on [place].

## 2022-07-04 NOTE — Progress Notes (Signed)
I called the patient and spoke to him regarding his recent concern for URI-like symptoms.  Awaiting COVID PCR.  Also discussed the recent recommendations/follow-up with endocrinology.  Follow-up as planned.

## 2022-07-04 NOTE — Telephone Encounter (Signed)
Patient scheduled for mychart visit with [name] at 1:30 today.

## 2022-07-04 NOTE — Progress Notes (Signed)
Virtual Visit via Video Note  I connected with [name] on 07/04/22 at  1:30 PM EDT by a video enabled telemedicine application and verified that I am speaking with the correct person using two identifiers.  Location: Patient: Home Provider: Clinic   I discussed the limitations of evaluation and management by telemedicine and the availability of in person appointments. The patient expressed understanding and agreed to proceed.  Due to technical difficulties, we switched to a telephone visit.  History of Present Illness: [name] is a 66 year old male with multiple medical problems including recurrent stage III melanoma metastatic to lymph node status post neoadjuvant Keytruda x4 followed by axillary lymph node dissection now on adjuvant Keytruda.   Observations/Objective: Patient was last seen by Dr. Rogue Bussing on 06/18/2022 at which time he received cycle 13 Keytruda out of planned total of 18 cycles.  Patient seen to be clinically improving.  He does have iatrogenic hypothyroidism on Synthroid and adrenal insufficiency on hydrocortisone.  Patient developed sore throat 3 to 4 days ago that led to rhinorrhea and fever (Tmax 101.2).  He has had slight productive cough but denies shortness of breath or chest pain or myalgias.  Patient has been taking Advil and Tylenol with some improvement in symptoms.  He is COVID vaccinated has not had recent booster.  Both he and wife took a home COVID test, which was negative.  Assessment and Plan: Viral URI -symptoms sound viral in etiology.  I recommended COVID/flu PCR testing and patient has been given information on a diagnostic clinic.  Patient will let us know if COVID or flu positive and we can proceed with antiviral treatment.  Otherwise, discussed symptomatic care and reviewed ED triggers.  Follow Up Instructions: We will leave appointments as scheduled for next week but this may need to be moved if patient is symptomatic or  COVID-positive.   I discussed the assessment and treatment plan with the patient. The patient was provided an opportunity to ask questions and all were answered. The patient agreed with the plan and demonstrated an understanding of the instructions.   The patient was advised to call back or seek an in-person evaluation if the symptoms worsen or if the condition fails to improve as anticipated.  I provided 10 minutes of non-face-to-face time during this encounter.   Irean Hong, NP

## 2022-07-05 ENCOUNTER — Encounter: Payer: Self-pay | Admitting: Internal Medicine

## 2022-07-09 ENCOUNTER — Inpatient Hospital Stay: Payer: Medicare HMO | Attending: Oncology

## 2022-07-09 ENCOUNTER — Inpatient Hospital Stay (HOSPITAL_BASED_OUTPATIENT_CLINIC_OR_DEPARTMENT_OTHER): Payer: Medicare HMO | Admitting: Internal Medicine

## 2022-07-09 ENCOUNTER — Inpatient Hospital Stay: Payer: Medicare HMO

## 2022-07-09 ENCOUNTER — Encounter: Payer: Self-pay | Admitting: Internal Medicine

## 2022-07-09 VITALS — BP 140/87 | HR 65 | Temp 96.9°F | Resp 16 | Ht 73.0 in | Wt 188.2 lb

## 2022-07-09 DIAGNOSIS — C773 Secondary and unspecified malignant neoplasm of axilla and upper limb lymph nodes: Secondary | ICD-10-CM | POA: Insufficient documentation

## 2022-07-09 DIAGNOSIS — C779 Secondary and unspecified malignant neoplasm of lymph node, unspecified: Secondary | ICD-10-CM

## 2022-07-09 DIAGNOSIS — C4359 Malignant melanoma of other part of trunk: Secondary | ICD-10-CM | POA: Diagnosis not present

## 2022-07-09 DIAGNOSIS — Z5112 Encounter for antineoplastic immunotherapy: Secondary | ICD-10-CM | POA: Diagnosis not present

## 2022-07-09 LAB — COMPREHENSIVE METABOLIC PANEL
ALT: 16 U/L (ref 0–44)
AST: 18 U/L (ref 15–41)
Albumin: 3.4 g/dL — ABNORMAL LOW (ref 3.5–5.0)
Alkaline Phosphatase: 66 U/L (ref 38–126)
Anion gap: 7 (ref 5–15)
BUN: 20 mg/dL (ref 8–23)
CO2: 24 mmol/L (ref 22–32)
Calcium: 8.7 mg/dL — ABNORMAL LOW (ref 8.9–10.3)
Chloride: 107 mmol/L (ref 98–111)
Creatinine, Ser: 1.12 mg/dL (ref 0.61–1.24)
GFR, Estimated: >60 mL/min (ref >60)
Glucose, Bld: 107 mg/dL — ABNORMAL HIGH (ref 70–99)
Potassium: 3.5 mmol/L (ref 3.5–5.1)
Sodium: 138 mmol/L (ref 135–145)
Total Bilirubin: 0.5 mg/dL (ref 0.3–1.2)
Total Protein: 6 g/dL — ABNORMAL LOW (ref 6.5–8.1)

## 2022-07-09 LAB — CBC WITH DIFFERENTIAL/PLATELET
Abs Immature Granulocytes: 0.02 10*3/uL (ref 0.00–0.07)
Basophils Absolute: 0.1 10*3/uL (ref 0.0–0.1)
Basophils Relative: 1 %
Eosinophils Absolute: 0.3 10*3/uL (ref 0.0–0.5)
Eosinophils Relative: 4 %
HCT: 42.4 % (ref 39.0–52.0)
Hemoglobin: 14.3 g/dL (ref 13.0–17.0)
Immature Granulocytes: 0 %
Lymphocytes Relative: 34 %
Lymphs Abs: 2.4 10*3/uL (ref 0.7–4.0)
MCH: 30.3 pg (ref 26.0–34.0)
MCHC: 33.7 g/dL (ref 30.0–36.0)
MCV: 89.8 fL (ref 80.0–100.0)
Monocytes Absolute: 0.5 10*3/uL (ref 0.1–1.0)
Monocytes Relative: 7 %
Neutro Abs: 3.8 10*3/uL (ref 1.7–7.7)
Neutrophils Relative %: 54 %
Platelets: 263 10*3/uL (ref 150–400)
RBC: 4.72 MIL/uL (ref 4.22–5.81)
RDW: 12.9 % (ref 11.5–15.5)
WBC: 7.1 10*3/uL (ref 4.0–10.5)
nRBC: 0 % (ref 0.0–0.2)

## 2022-07-09 MED ORDER — SODIUM CHLORIDE 0.9 % IV SOLN
200.0000 mg | Freq: Once | INTRAVENOUS | Status: AC
  Administered 2022-07-09: 200 mg via INTRAVENOUS
  Filled 2022-07-09: qty 8

## 2022-07-09 MED ORDER — SODIUM CHLORIDE 0.9 % IV SOLN
Freq: Once | INTRAVENOUS | Status: AC
  Filled 2022-07-09: qty 250

## 2022-07-09 NOTE — Progress Notes (Signed)
Patient still having head congestion with occasional cough.  Needs to discuss day for next treatment.

## 2022-07-09 NOTE — Assessment & Plan Note (Addendum)
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  S/p neoadjuvant Keytruda x4- -Status post axillary lymph node dissection-with residual micrometastatic disease.  Patient currently on adjuvant Keytruda.  Tolerating with moderate-severe difficulties see below  # proceed with  [place] #14 today of planned total 18. Labs today reviewed. [place] with treatment.  #Viral URI- - [sep 2023]-COVID testing negative; symptoms resolved.  Recommend claritin D.   #Endocrinopathies: Hypothyroidism/adrenal insufficiency: [s/p evaluation- SEP 2023- Endocrinology-] iatrogenic hypothyroidism [AUG 2023; TSH- 11]-on Synthroid 150 mcg once a day; Adrenal insufficiency -extreme fatigue--currently on hydrocortsione 20 mg in the morning and 10 mg at night. STABLE.   # Vaccinations: ok with flu/Covid shots.   * Monday pref  # DISPOSITION:   # Keytruda today # follow up in 3 weeks- MD; labs- cbc/cmp;Keytruda-  Dr.B.

## 2022-07-09 NOTE — Progress Notes (Signed)
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   There is sufficient material for ancillary molecular testing if needed  (block A3, A1, A2).   Comment:  Per provided outside pathology report the patient had an "at least pT1b"  malignant melanoma of the left lateral infraclavicular skin in 2015.  Touch preparations demonstrate predominantly discohesive large  pleomorphic cells, some with binucleation.  HE sections demonstrate  lymph node parenchyma involved by metastatic neoplasm.  The neoplastic  cells are pleomorphic with binucleation, focal intranuclear inclusions,  and associated tumor necrosis. The neoplastic cells demonstrate the  following pattern of immunoreactivity:  S100: Positive  SOX10: Positive  HMB-45: Negative  Melan-A: Negative  Super pancytokeratin: Negative  CD45: Negative  This pattern of immunoreactivity supports the above diagnosis.   # NOV 7th, 2022-neoadjuvant Keytruda cycle #1  # JAN, 26th, [name],  majority of these foci measure 1-2 mm in greatest dimension, with the largest measuring 4 mm.  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  Accompanied by his wife.   [name] 66 y.o.  male pleasant patient above history of recurrent melanoma left axillary lymphadenopathy-s/p surgery is currently on adjuvant Keytruda-with iatrogenic hypothyroidism /adrenal insufficiency is here for follow-up.   In the interim patient was evaluated by endocrinology in [place]-for his adrenal sufficiency hypothyroidism.  Also followed by neurology for his neuropathy.  Neuropathy is currently resolved.  Also patient diagnosed with viral fever in the interim.  COVID testing was negative.  Patient states that he is fairly back to his baseline.  Patient still having head congestion with occasional cough.   Review of Systems  Constitutional:  Negative for chills, diaphoresis, fever, malaise/fatigue and weight loss.  HENT:  Negative for nosebleeds and sore throat.   Eyes:  Negative for double vision.  Respiratory:  Negative for hemoptysis, sputum production, shortness of breath and wheezing.   Cardiovascular:  Negative for chest pain, palpitations, orthopnea and leg swelling.  Gastrointestinal:  Negative for abdominal pain, blood in stool, constipation, diarrhea, heartburn, melena, nausea and vomiting.  Genitourinary:  Negative for  dysuria, frequency and urgency.  Musculoskeletal:  Positive for myalgias. Negative for back  pain and joint pain.  Skin: Negative.  Negative for itching and rash.  Neurological:  Positive for tingling. Negative for dizziness, focal weakness and weakness.  Endo/Heme/Allergies:  Does not bruise/bleed easily.  Psychiatric/Behavioral:  Negative for depression. The patient is not nervous/anxious and does not have insomnia.       PAST MEDICAL HISTORY :  Past Medical History:  Diagnosis Date   Basal cell carcinoma 03/10/2013   Right medial forehead. Excised, margins free.   Dysplastic nevus 11/05/2006   Mid back paraspinal, [name], close to edge. Excised 12/24/2006, margins free.   Dysplastic nevus 12/15/2008   Left lat. pectoral area. Moderate atypia, extends to one edge.    Dysplastic nevus 12/07/2009   Left mid side. Moderate atypia, close to margin.   Dysplastic nevus 12/07/2009   Right posterior waistline. Moderate atypia, close to margin.   Dysplastic nevus 02/10/2014   Left paraspinal mid back. Moderate atypia, lateral and deep margin involved.    Dysplastic nevus 06/16/2014   Left epigastric. Mild atypia, deep margin involved.    GERD (gastroesophageal reflux disease)    Hx of basal cell carcinoma    multiple sites   Hx of malignant melanoma 11/11/2013   L lateral infraclavicular, superficial spreading, Breslow's 0.74m, Clark's level III   Hypothyroidism    [place]   Left lateral infraclavicular. MM, superficial spreading. Anatomic level III. Tumor thickness 0.679m Excised 11/24/2013, margins free.    [facility] ([facility]   L axilla, excised by Dr. ByFleet Contrasfollowd by Dr. BrBurlene Arntith KeBeryle Flock  PAST SURGICAL HISTORY :   Past Surgical History:  Procedure Laterality Date   AXILLARY LYMPH NODE DISSECTION Left 11/02/2021   Procedure: AXILLARY LYMPH NODE DISSECTION;  [name];  [name];  Service: General;  Laterality: Left;   COLONOSCOPY WITH PROPOFOL N/A  01/20/2017   Procedure: COLONOSCOPY WITH PROPOFOL;  [name];  [facility];  Service: Endoscopy;  [name];   ESOPHAGOGASTRODUODENOSCOPY (EGD) WITH ESOPHAGEAL DILATION     MELANOMA EXCISION Right 11/24/2013   lateral infraclavicular   WISDOM TOOTH EXTRACTION     WRIST SURGERY Left 2022    FAMILY HISTORY :   Family History  Problem Relation Age of Onset   Macular degeneration Mother    Hyperlipidemia Mother    Heart disease Father    Heart attack Father    Lung cancer Father        metastasized    SOCIAL HISTORY:   Social History   Tobacco Use   Smoking status: Never   Smokeless tobacco: Never  Vaping Use   Vaping Use: Never used  Substance Use Topics   Alcohol use: Yes    Comment: about 1-3 glass of wine with dinner   Drug use: No    ALLERGIES:  has No Known Allergies.  MEDICATIONS:  Current Outpatient Medications  Medication Sig Dispense Refill   aspirin 81 MG tablet Take 81 mg by mouth daily.     fluorouracil (EFUDEX) 5 % cream Apply to scalp twice a day for 7 days, 40 g 1   hydrocortisone (CORTEF) 10 MG tablet TAKE 2 PILLS IN AM 1 PILL AT NIGHT. DO NOT STOP UNTIL DIRECTED. TAKE WITH FOOD. 90 tablet 1   levothyroxine (SYNTHROID) 125 MCG tablet Take 1 tablet (125 mcg total) by mouth daily  before breakfast. 90 tablet 3   mometasone (ELOCON) 0.1 % cream Apply to aa's rash BID up to two weeks. 15 g 0   Multiple Vitamins tablet Take 1 tablet by mouth daily.     Multiple Vitamins-Minerals (PRESERVISION AREDS 2 PO) Take 1 capsule by mouth in the morning and at bedtime.     pantoprazole (PROTONIX) 20 MG tablet Take 20 mg by mouth daily.  0   Pembrolizumab (KEYTRUDA IV) Inject 1 Dose into the vein every 21 ( twenty-one) days.     No current facility-administered medications for this visit.   Facility-Administered Medications Ordered in Other Visits  Medication Dose Route Frequency Provider Last Rate Last Admin   pembrolizumab  (KEYTRUDA) 200 mg in sodium chloride 0.9 % 50 mL chemo infusion  200 mg Intravenous Once Cammie Sickle, MD 116 mL/hr at 07/09/22 1119 200 mg at 07/09/22 1119    PHYSICAL EXAMINATION: ECOG PERFORMANCE STATUS: 0 - Asymptomatic  BP (!) 140/87 (BP Location: Right Arm, Patient Position: Sitting)   Pulse 65   Temp (!) 96.9 F (36.1 C) (Tympanic)   Resp 16   Ht '6\' 1"'$  (1.854 m)   Wt 188 lb 3.2 oz (85.4 kg)   BMI 24.83 kg/m   Filed Weights   07/09/22 0900  Weight: 188 lb 3.2 oz (85.4 kg)     Positive for lymph node left underarm approximate 2 to 3 cm in size.  Physical Exam Vitals and nursing note reviewed.  HENT:     Head: Normocephalic and atraumatic.     Mouth/Throat:     Pharynx: Oropharynx is clear.  Eyes:     Extraocular Movements: Extraocular movements intact.     Pupils: Pupils are equal, round, and reactive to light.  Cardiovascular:     Rate and Rhythm: Normal rate and regular rhythm.  Pulmonary:     Comments: Decreased breath sounds bilaterally.  Abdominal:     Palpations: Abdomen is soft.  Musculoskeletal:        General: Normal range of motion.     Cervical back: Normal range of motion.  Skin:    General: Skin is warm.  Neurological:     General: No focal deficit present.     Mental Status: He is alert and oriented to person, place, and time.  Psychiatric:        Behavior: Behavior normal.        Judgment: Judgment normal.        LABORATORY DATA:  I have reviewed the data as listed    Component Value Date/Time   NA 138 07/09/2022 0928   NA 144 05/16/2022 0953   K 3.5 07/09/2022 0928   CL 107 07/09/2022 0928   CO2 24 07/09/2022 0928   GLUCOSE 107 (H) 07/09/2022 0928   BUN 20 07/09/2022 0928   BUN 12 05/16/2022 0953   CREATININE 1.12 07/09/2022 0928   CALCIUM 8.7 (L) 07/09/2022 0928   PROT 6.0 (L) 07/09/2022 0928   PROT 5.7 (L) 05/16/2022 0953   ALBUMIN 3.4 (L) 07/09/2022 0928   ALBUMIN 3.9 05/16/2022 0953   AST 18 07/09/2022 0928    ALT 16 07/09/2022 0928   ALKPHOS 66 07/09/2022 0928   BILITOT 0.5 07/09/2022 0928   BILITOT 0.5 05/16/2022 0953   GFRNONAA >60 07/09/2022 0928   GFRAA 75 05/10/2020 1017    No results found for: "SPEP", "UPEP"  Lab Results  Component Value Date   WBC 7.1 07/09/2022   NEUTROABS 3.8 07/09/2022  HGB 14.3 07/09/2022   HCT 42.4 07/09/2022   MCV 89.8 07/09/2022   PLT 263 07/09/2022      Chemistry      Component Value Date/Time   NA 138 07/09/2022 0928   NA 144 05/16/2022 0953   K 3.5 07/09/2022 0928   CL 107 07/09/2022 0928   CO2 24 07/09/2022 0928   BUN 20 07/09/2022 0928   BUN 12 05/16/2022 0953   CREATININE 1.12 07/09/2022 0928   GLU 96 10/19/2014 0000      Component Value Date/Time   CALCIUM 8.7 (L) 07/09/2022 0928   ALKPHOS 66 07/09/2022 0928   AST 18 07/09/2022 0928   ALT 16 07/09/2022 0928   BILITOT 0.5 07/09/2022 0928   BILITOT 0.5 05/16/2022 0953       RADIOGRAPHIC STUDIES: I have personally reviewed the radiological images as listed and agreed with the findings in the report. No results found.   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  S/p neoadjuvant Keytruda x4- -Status post axillary lymph node dissection-with residual micrometastatic disease.  Patient currently on adjuvant Keytruda.  Tolerating with moderate-severe difficulties see below  # proceed with  [place] #14 today of planned total 18. Labs today reviewed. [place] with treatment.  #Viral URI- - [sep 2023]-COVID testing negative; symptoms resolved.  Recommend claritin D.   #Endocrinopathies: Hypothyroidism/adrenal insufficiency: [s/p evaluation- SEP 2023- Endocrinology-] iatrogenic hypothyroidism [AUG 2023; TSH- 11]-on Synthroid 150 mcg once a day; Adrenal insufficiency -extreme fatigue--currently on hydrocortsione 20 mg in the morning and 10 mg at night. STABLE.   # Vaccinations: ok with flu/Covid shots.    * Monday pref  # DISPOSITION:   # Keytruda today # follow up in 3 weeks- MD; labs- cbc/cmp;Keytruda-  Dr.B.        Orders Placed This Encounter  Procedures   CBC with Differential    Standing Status:   Future    Standing Expiration Date:   07/31/2023   Comprehensive metabolic panel    Standing Status:   Future    Standing Expiration Date:   07/31/2023   T4    Standing Status:   Future    Standing Expiration Date:   07/31/2023   TSH    Standing Status:   Future    Standing Expiration Date:   07/31/2023    All questions were answered. The patient knows to call the clinic with any problems, questions or concerns.      Cammie Sickle, MD 07/09/2022 11:20 AM

## 2022-07-10 DIAGNOSIS — M5033 Other cervical disc degeneration, cervicothoracic region: Secondary | ICD-10-CM | POA: Diagnosis not present

## 2022-07-10 DIAGNOSIS — M62830 Muscle spasm of back: Secondary | ICD-10-CM | POA: Diagnosis not present

## 2022-07-10 DIAGNOSIS — M9903 Segmental and somatic dysfunction of lumbar region: Secondary | ICD-10-CM | POA: Diagnosis not present

## 2022-07-10 DIAGNOSIS — M9901 Segmental and somatic dysfunction of cervical region: Secondary | ICD-10-CM | POA: Diagnosis not present

## 2022-07-30 ENCOUNTER — Encounter: Payer: Self-pay | Admitting: Internal Medicine

## 2022-07-30 ENCOUNTER — Inpatient Hospital Stay: Payer: Medicare HMO

## 2022-07-30 ENCOUNTER — Inpatient Hospital Stay (HOSPITAL_BASED_OUTPATIENT_CLINIC_OR_DEPARTMENT_OTHER): Payer: Medicare HMO | Admitting: Internal Medicine

## 2022-07-30 VITALS — BP 137/81 | HR 64 | Temp 95.3°F | Resp 20 | Wt 188.2 lb

## 2022-07-30 DIAGNOSIS — C779 Secondary and unspecified malignant neoplasm of lymph node, unspecified: Secondary | ICD-10-CM

## 2022-07-30 DIAGNOSIS — Z5112 Encounter for antineoplastic immunotherapy: Secondary | ICD-10-CM | POA: Diagnosis not present

## 2022-07-30 DIAGNOSIS — C773 Secondary and unspecified malignant neoplasm of axilla and upper limb lymph nodes: Secondary | ICD-10-CM | POA: Diagnosis not present

## 2022-07-30 DIAGNOSIS — C4359 Malignant melanoma of other part of trunk: Secondary | ICD-10-CM | POA: Diagnosis not present

## 2022-07-30 LAB — CBC WITH DIFFERENTIAL/PLATELET
Abs Immature Granulocytes: 0.01 10*3/uL (ref 0.00–0.07)
Basophils Absolute: 0 10*3/uL (ref 0.0–0.1)
Basophils Relative: 1 %
Eosinophils Absolute: 0.3 10*3/uL (ref 0.0–0.5)
Eosinophils Relative: 5 %
HCT: 45.7 % (ref 39.0–52.0)
Hemoglobin: 14.9 g/dL (ref 13.0–17.0)
Immature Granulocytes: 0 %
Lymphocytes Relative: 48 %
Lymphs Abs: 2.5 10*3/uL (ref 0.7–4.0)
MCH: 30 pg (ref 26.0–34.0)
MCHC: 32.6 g/dL (ref 30.0–36.0)
MCV: 92 fL (ref 80.0–100.0)
Monocytes Absolute: 0.5 10*3/uL (ref 0.1–1.0)
Monocytes Relative: 10 %
Neutro Abs: 1.9 10*3/uL (ref 1.7–7.7)
Neutrophils Relative %: 36 %
Platelets: 204 10*3/uL (ref 150–400)
RBC: 4.97 MIL/uL (ref 4.22–5.81)
RDW: 13.5 % (ref 11.5–15.5)
WBC: 5.2 10*3/uL (ref 4.0–10.5)
nRBC: 0 % (ref 0.0–0.2)

## 2022-07-30 LAB — COMPREHENSIVE METABOLIC PANEL
ALT: 16 U/L (ref 0–44)
AST: 20 U/L (ref 15–41)
Albumin: 3.7 g/dL (ref 3.5–5.0)
Alkaline Phosphatase: 47 U/L (ref 38–126)
Anion gap: 6 (ref 5–15)
BUN: 21 mg/dL (ref 8–23)
CO2: 25 mmol/L (ref 22–32)
Calcium: 8.9 mg/dL (ref 8.9–10.3)
Chloride: 107 mmol/L (ref 98–111)
Creatinine, Ser: 0.96 mg/dL (ref 0.61–1.24)
GFR, Estimated: >60 mL/min (ref >60)
Glucose, Bld: 84 mg/dL (ref 70–99)
Potassium: 3.8 mmol/L (ref 3.5–5.1)
Sodium: 138 mmol/L (ref 135–145)
Total Bilirubin: 0.5 mg/dL (ref 0.3–1.2)
Total Protein: 6.2 g/dL — ABNORMAL LOW (ref 6.5–8.1)

## 2022-07-30 MED ORDER — SODIUM CHLORIDE 0.9 % IV SOLN
200.0000 mg | Freq: Once | INTRAVENOUS | Status: AC
  Administered 2022-07-30: 200 mg via INTRAVENOUS
  Filled 2022-07-30: qty 8

## 2022-07-30 MED ORDER — SODIUM CHLORIDE 0.9 % IV SOLN
Freq: Once | INTRAVENOUS | Status: AC
  Filled 2022-07-30: qty 250

## 2022-07-30 NOTE — Progress Notes (Signed)
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   There is sufficient material for ancillary molecular testing if needed  (block A3, A1, A2).   Comment:  Per provided outside pathology report the patient had an "at least pT1b"  malignant melanoma of the left lateral infraclavicular skin in 2015.  Touch preparations demonstrate predominantly discohesive large  pleomorphic cells, some with binucleation.  HE sections demonstrate  lymph node parenchyma involved by metastatic neoplasm.  The neoplastic  cells are pleomorphic with binucleation, focal intranuclear inclusions,  and associated tumor necrosis. The neoplastic cells demonstrate the  following pattern of immunoreactivity:  S100: Positive  SOX10: Positive  HMB-45: Negative  Melan-A: Negative  Super pancytokeratin: Negative  CD45: Negative  This pattern of immunoreactivity supports the above diagnosis.   # NOV 7th, 2022-neoadjuvant Keytruda cycle #1  # JAN, 26th, [name],  majority of these foci measure 1-2 mm in greatest dimension, with the largest measuring 4 mm.  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  Accompanied by his wife.   [name] 66 y.o.  male pleasant patient above history of recurrent melanoma left axillary lymphadenopathy-s/p surgery is currently on adjuvant Keytruda-with iatrogenic hypothyroidism /adrenal insufficiency is here for follow-up.   Denies any shortness of breath or cough.  No fever no chills.  No skin rash.  Review of Systems  Constitutional:  Negative for chills, diaphoresis, fever, malaise/fatigue and weight loss.  HENT:  Negative for nosebleeds and sore throat.   Eyes:  Negative for double vision.  Respiratory:  Negative for hemoptysis, sputum production, shortness of breath and wheezing.   Cardiovascular:  Negative for chest pain, palpitations, orthopnea and leg swelling.  Gastrointestinal:  Negative for abdominal pain, blood in stool, constipation, diarrhea, heartburn, melena, nausea and vomiting.  Genitourinary:  Negative for dysuria, frequency and urgency.  Musculoskeletal:  Positive for myalgias. Negative for back pain and joint pain.  Skin: Negative.  Negative for itching and rash.  Neurological:  Positive for tingling. Negative for dizziness, focal weakness and weakness.  Endo/Heme/Allergies:  Does not bruise/bleed easily.  Psychiatric/Behavioral:  Negative for depression. The patient is not nervous/anxious and does not have  insomnia.       PAST MEDICAL HISTORY :  Past Medical History:  Diagnosis Date   Basal cell carcinoma 03/10/2013   Right medial forehead. Excised, margins free.   Dysplastic nevus 11/05/2006   Mid back paraspinal, [name], close to edge. Excised 12/24/2006, margins free.   Dysplastic nevus 12/15/2008   Left lat. pectoral area. Moderate atypia, extends to one edge.    Dysplastic nevus 12/07/2009   Left mid side. Moderate atypia, close to margin.   Dysplastic nevus 12/07/2009   Right posterior waistline. Moderate atypia, close to margin.   Dysplastic nevus 02/10/2014   Left paraspinal mid back. Moderate atypia, lateral and deep margin involved.    Dysplastic nevus 06/16/2014   Left epigastric. Mild atypia, deep margin involved.    GERD (gastroesophageal reflux disease)    Hx of basal cell carcinoma    multiple sites   Hx of malignant melanoma 11/11/2013   L lateral infraclavicular, superficial spreading, Breslow's 0.93m, Clark's level III   Hypothyroidism    [facility]anoma ([facility] 11/11/2013   Left lateral infraclavicular. MM, superficial spreading. Anatomic level III. Tumor thickness 0.625m Excised 11/24/2013, margins free.    [facility] ([facility]   L axilla, excised by Dr. ByFleet Contrasfollowd by Dr. BrBurlene Arntith KeBeryle Flock  PAST SURGICAL HISTORY :   Past Surgical History:  Procedure Laterality Date   AXILLARY LYMPH NODE DISSECTION Left 11/02/2021   Procedure: AXILLARY LYMPH NODE DISSECTION;  [name];  [name];  Service: General;  Laterality: Left;   COLONOSCOPY WITH PROPOFOL N/A 01/20/2017   Procedure: COLONOSCOPY WITH PROPOFOL;  [name];  [facility];  Service: Endoscopy;  [name];   ESOPHAGOGASTRODUODENOSCOPY (EGD) WITH ESOPHAGEAL DILATION     MELANOMA EXCISION Right 11/24/2013   lateral infraclavicular   WISDOM TOOTH EXTRACTION     WRIST SURGERY Left  2022    FAMILY HISTORY :   Family History  Problem Relation Age of Onset   Macular degeneration Mother    Hyperlipidemia Mother    Heart disease Father    Heart attack Father    Lung cancer Father        metastasized    SOCIAL HISTORY:   Social History   Tobacco Use   Smoking status: Never   Smokeless tobacco: Never  Vaping Use   Vaping Use: Never used  Substance Use Topics   Alcohol use: Yes    Comment: about 1-3 glass of wine with dinner   Drug use: No    ALLERGIES:  has No Known Allergies.  MEDICATIONS:  Current Outpatient Medications  Medication Sig Dispense Refill   aspirin 81 MG tablet Take 81 mg by mouth daily.     hydrocortisone (CORTEF) 10 MG tablet TAKE 2 PILLS IN AM 1 PILL AT NIGHT. DO NOT STOP UNTIL DIRECTED. TAKE WITH FOOD. 90 tablet 1   levothyroxine (SYNTHROID) 125 MCG tablet Take 1 tablet (125 mcg total) by mouth daily before breakfast. 90 tablet 3   Multiple Vitamins tablet Take 1 tablet by mouth daily.     Multiple Vitamins-Minerals (PRESERVISION AREDS 2 PO) Take 1 capsule by mouth in the morning and at bedtime.     pantoprazole (PROTONIX) 20 MG tablet Take 20 mg by mouth daily.  0   Pembrolizumab (KEYTRUDA IV) Inject 1 Dose into the vein every  21 ( twenty-one) days.     fluorouracil (EFUDEX) 5 % cream Apply to scalp twice a day for 7 days, 40 g 1   mometasone (ELOCON) 0.1 % cream Apply to aa's rash BID up to two weeks. 15 g 0   No current facility-administered medications for this visit.    PHYSICAL EXAMINATION: ECOG PERFORMANCE STATUS: 0 - Asymptomatic  BP 137/81   Pulse 64   Temp (!) 95.3 F (35.2 C)   Resp 20   Wt 188 lb 3.2 oz (85.4 kg)   SpO2 99%   BMI 24.83 kg/m   Filed Weights   07/30/22 0953  Weight: 188 lb 3.2 oz (85.4 kg)   Physical Exam Vitals and nursing note reviewed.  HENT:     Head: Normocephalic and atraumatic.     Mouth/Throat:     Pharynx: Oropharynx is clear.  Eyes:     Extraocular Movements: Extraocular  movements intact.     Pupils: Pupils are equal, round, and reactive to light.  Cardiovascular:     Rate and Rhythm: Normal rate and regular rhythm.  Pulmonary:     Comments: Decreased breath sounds bilaterally.  Abdominal:     Palpations: Abdomen is soft.  Musculoskeletal:        General: Normal range of motion.     Cervical back: Normal range of motion.  Skin:    General: Skin is warm.  Neurological:     General: No focal deficit present.     Mental Status: He is alert and oriented to person, place, and time.  Psychiatric:        Behavior: Behavior normal.        Judgment: Judgment normal.        LABORATORY DATA:  I have reviewed the data as listed    Component Value Date/Time   NA 138 07/30/2022 0942   NA 144 05/16/2022 0953   K 3.8 07/30/2022 0942   CL 107 07/30/2022 0942   CO2 25 07/30/2022 0942   GLUCOSE 84 07/30/2022 0942   BUN 21 07/30/2022 0942   BUN 12 05/16/2022 0953   CREATININE 0.96 07/30/2022 0942   CALCIUM 8.9 07/30/2022 0942   PROT 6.2 (L) 07/30/2022 0942   PROT 5.7 (L) 05/16/2022 0953   ALBUMIN 3.7 07/30/2022 0942   ALBUMIN 3.9 05/16/2022 0953   AST 20 07/30/2022 0942   ALT 16 07/30/2022 0942   ALKPHOS 47 07/30/2022 0942   BILITOT 0.5 07/30/2022 0942   BILITOT 0.5 05/16/2022 0953   GFRNONAA >60 07/30/2022 0942   GFRAA 75 05/10/2020 1017    No results found for: "SPEP", "UPEP"  Lab Results  Component Value Date   WBC 5.2 07/30/2022   NEUTROABS 1.9 07/30/2022   HGB 14.9 07/30/2022   HCT 45.7 07/30/2022   MCV 92.0 07/30/2022   PLT 204 07/30/2022      Chemistry      Component Value Date/Time   NA 138 07/30/2022 0942   NA 144 05/16/2022 0953   K 3.8 07/30/2022 0942   CL 107 07/30/2022 0942   CO2 25 07/30/2022 0942   BUN 21 07/30/2022 0942   BUN 12 05/16/2022 0953   CREATININE 0.96 07/30/2022 0942   GLU 96 10/19/2014 0000      Component Value Date/Time   CALCIUM 8.9 07/30/2022 0942   ALKPHOS 47 07/30/2022 0942   AST 20  07/30/2022 0942   ALT 16 07/30/2022 0942   BILITOT 0.5 07/30/2022 0942   BILITOT 0.5 05/16/2022  0953       RADIOGRAPHIC STUDIES: I have personally reviewed the radiological images as listed and agreed with the findings in the report. No results found.   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  S/p neoadjuvant Keytruda x4- -Status post axillary lymph node dissection-with residual micrometastatic disease.  Patient currently on adjuvant Keytruda.  Tolerating with moderate-severe difficulties see below  # proceed with  [place] #15 today of planned total 18. Labs today reviewed. [place] with treatment.  #Viral URI- - [sep 2023]-COVID testing negative; symptoms resolved.  Recommend claritin D.   #Endocrinopathies: Hypothyroidism/adrenal insufficiency: [s/p evaluation- SEP 2023- Endocrinology-] iatrogenic hypothyroidism [AUG 2023; TSH- 11]-on Synthroid 150 mcg once a day; SEP 2023- TSH 0.15; Normal T3/T4-. Monitor for now.  Adrenal insufficiency -extreme fatigue--currently on hydrocortsione 20 mg in the morning and 10 mg at night.  STABLE. Pt will call re: Thyroid labs as per endo.   # Vaccinations: ok with flu/Covid shots.   # DISPOSITION:   # Keytruda today # follow up in 3 weeks/Nov 13th- MD; labs- cbc/cmp;Keytruda-   Dr.B.         Orders Placed This Encounter  Procedures   CBC with Differential    Standing Status:   Future    Standing Expiration Date:   08/20/2023   Comprehensive metabolic panel    Standing Status:   Future    Standing Expiration Date:   08/20/2023    All questions were answered. The patient knows to call the clinic with any problems, questions or concerns.      Cammie Sickle, MD 07/30/2022 10:28 AM

## 2022-07-30 NOTE — Patient Instructions (Signed)
[facility]  Discharge Instructions: Thank you for choosing [facility] to provide your oncology and hematology care.  If you have a lab appointment with the Cancer Center, please go directly to the Cancer Center and check in at the registration area.  Wear comfortable clothing and clothing appropriate for easy access to any Portacath or PICC line.   We strive to give you quality time with your provider. You may need to reschedule your appointment if you arrive late (15 or more minutes).  Arriving late affects you and other patients whose appointments are after yours.  Also, if you miss three or more appointments without notifying the office, you may be dismissed from the clinic at the provider's discretion.      For prescription refill requests, have your pharmacy contact our office and allow 72 hours for refills to be completed.    Today you received the following chemotherapy and/or immunotherapy agents KEYTRUDA      To help prevent nausea and vomiting after your treatment, we encourage you to take your nausea medication as directed.  BELOW ARE SYMPTOMS THAT SHOULD BE REPORTED IMMEDIATELY: *FEVER GREATER THAN 100.4 F (38 C) OR HIGHER *CHILLS OR SWEATING *NAUSEA AND VOMITING THAT IS NOT CONTROLLED WITH YOUR NAUSEA MEDICATION *UNUSUAL SHORTNESS OF BREATH *UNUSUAL BRUISING OR BLEEDING *URINARY PROBLEMS (pain or burning when urinating, or frequent urination) *BOWEL PROBLEMS (unusual diarrhea, constipation, pain near the anus) TENDERNESS IN MOUTH AND THROAT WITH OR WITHOUT PRESENCE OF ULCERS (sore throat, sores in mouth, or a toothache) UNUSUAL RASH, SWELLING OR PAIN  UNUSUAL VAGINAL DISCHARGE OR ITCHING   Items with * indicate a potential emergency and should be followed up as soon as possible or go to the Emergency Department if any problems should occur.  Please show the CHEMOTHERAPY ALERT CARD or IMMUNOTHERAPY ALERT CARD at check-in to  the Emergency Department and triage nurse.  Should you have questions after your visit or need to cancel or reschedule your appointment, please contact [facility]  [phone] and follow the prompts.  Office hours are 8:00 a.m. to 4:30 p.m. Monday - Friday. Please note that voicemails left after 4:00 p.m. may not be returned until the following business day.  We are closed weekends and major holidays. You have access to a nurse at all times for urgent questions. Please call the main number to the clinic [phone] and follow the prompts.  For any non-urgent questions, you may also contact your provider using MyChart. We now offer e-Visits for anyone 18 and older to request care online for non-urgent symptoms. For details visit [email or web address].   Also download the MyChart app! Go to the app store, search "MyChart", open the app, select [facility], and log in with your MyChart username and password.  Masks are optional in the cancer centers. If you would like for your care team to wear a mask while they are taking care of you, please let them know. For doctor visits, patients may have with them one support person who is at least 68 years old. At this time, visitors are not allowed in the infusion area.  Pembrolizumab Injection What is this medication? PEMBROLIZUMAB (PEM broe LIZ ue mab) treats some types of cancer. It works by helping your immune system slow or stop the spread of cancer cells. It is a monoclonal antibody. This medicine may be used for other purposes; ask your health care provider or pharmacist if you have questions.   COMMON BRAND NAME(S): Keytruda What should I tell my care team before I take this medication? They need to know if you have any of these conditions: Allogeneic stem cell transplant (uses someone else's stem cells) Autoimmune diseases, such as Crohn disease, ulcerative colitis, lupus History of chest radiation Nervous system  problems, such as Guillain-Barre syndrome, myasthenia gravis Organ transplant An unusual or allergic reaction to pembrolizumab, other medications, foods, dyes, or preservatives Pregnant or trying to get pregnant Breast-feeding How should I use this medication? This medication is injected into a vein. It is given by your care team in a hospital or clinic setting. A special MedGuide will be given to you before each treatment. Be sure to read this information carefully each time. Talk to your care team about the use of this medication in children. While it may be prescribed for children as young as 6 months for selected conditions, precautions do apply. Overdosage: If you think you have taken too much of this medicine contact a poison control center or emergency room at once. NOTE: This medicine is only for you. Do not share this medicine with others. What if I miss a dose? Keep appointments for follow-up doses. It is important not to miss your dose. Call your care team if you are unable to keep an appointment. What may interact with this medication? Interactions have not been studied. This list may not describe all possible interactions. Give your health care provider a list of all the medicines, herbs, non-prescription drugs, or dietary supplements you use. Also tell them if you smoke, drink alcohol, or use illegal drugs. Some items may interact with your medicine. What should I watch for while using this medication? Your condition will be monitored carefully while you are receiving this medication. You may need blood work while taking this medication. This medication may cause serious skin reactions. They can happen weeks to months after starting the medication. Contact your care team right away if you notice fevers or flu-like symptoms with a rash. The rash may be red or purple and then turn into blisters or peeling of the skin. You may also notice a red rash with swelling of the face, lips, or  lymph nodes in your neck or under your arms. Tell your care team right away if you have any change in your eyesight. Talk to your care team if you may be pregnant. Serious birth defects can occur if you take this medication during pregnancy and for 4 months after the last dose. You will need a negative pregnancy test before starting this medication. Contraception is recommended while taking this medication and for 4 months after the last dose. Your care team can help you find the option that works for you. Do not breastfeed while taking this medication and for 4 months after the last dose. What side effects may I notice from receiving this medication? Side effects that you should report to your care team as soon as possible: Allergic reactions--skin rash, itching, hives, swelling of the face, lips, tongue, or throat Dry cough, shortness of breath or trouble breathing Eye pain, redness, irritation, or discharge with blurry or decreased vision Heart muscle inflammation--unusual weakness or fatigue, shortness of breath, chest pain, fast or irregular heartbeat, dizziness, swelling of the ankles, feet, or hands Hormone gland problems--headache, sensitivity to light, unusual weakness or fatigue, dizziness, fast or irregular heartbeat, increased sensitivity to cold or heat, excessive sweating, constipation, hair loss, increased thirst or amount of urine, tremors or shaking, irritability Infusion   reactions--chest pain, shortness of breath or trouble breathing, feeling faint or lightheaded Kidney injury (glomerulonephritis)--decrease in the amount of urine, red or dark brown urine, foamy or bubbly urine, swelling of the ankles, hands, or feet Liver injury--right upper belly pain, loss of appetite, nausea, light-colored stool, dark yellow or brown urine, yellowing skin or eyes, unusual weakness or fatigue Pain, tingling, or numbness in the hands or feet, muscle weakness, change in vision, confusion or trouble  speaking, loss of balance or coordination, trouble walking, seizures Rash, fever, and swollen lymph nodes Redness, blistering, peeling, or loosening of the skin, including inside the mouth Sudden or severe stomach pain, bloody diarrhea, fever, nausea, vomiting Side effects that usually do not require medical attention (report to your care team if they continue or are bothersome): Bone, joint, or muscle pain Diarrhea Fatigue Loss of appetite Nausea Skin rash This list may not describe all possible side effects. Call your doctor for medical advice about side effects. You may report side effects to FDA at 1-800-FDA-1088. Where should I keep my medication? This medication is given in a hospital or clinic. It will not be stored at home. NOTE: This sheet is a summary. It may not cover all possible information. If you have questions about this medicine, talk to your doctor, pharmacist, or health care provider.  2023 Elsevier/Gold Standard (2022-01-14 00:00:00)   

## 2022-07-30 NOTE — Progress Notes (Signed)
Patient states no concerns at the moment. 

## 2022-07-30 NOTE — Assessment & Plan Note (Addendum)
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  S/p neoadjuvant Keytruda x4- -Status post axillary lymph node dissection-with residual micrometastatic disease.  Patient currently on adjuvant Keytruda.  Tolerating with moderate-severe difficulties see below  # proceed with  [place] #15 today of planned total 18. Labs today reviewed. [place] with treatment.  #Viral URI- - [sep 2023]-COVID testing negative; symptoms resolved.  Recommend claritin D.   #Endocrinopathies: Hypothyroidism/adrenal insufficiency: [s/p evaluation- SEP 2023- Endocrinology-] iatrogenic hypothyroidism [AUG 2023; TSH- 11]-on Synthroid 150 mcg once a day; SEP 2023- TSH 0.15; Normal T3/T4-. Monitor for now.  Adrenal insufficiency -extreme fatigue--currently on hydrocortsione 20 mg in the morning and 10 mg at night.  STABLE. Pt will call re: Thyroid labs as per endo.   # Vaccinations: ok with flu/Covid shots.   # DISPOSITION:   # Keytruda today # follow up in 3 weeks/Nov 13th- MD; labs- cbc/cmp;Keytruda-   Dr.B.

## 2022-08-07 DIAGNOSIS — M5033 Other cervical disc degeneration, cervicothoracic region: Secondary | ICD-10-CM | POA: Diagnosis not present

## 2022-08-07 DIAGNOSIS — M9903 Segmental and somatic dysfunction of lumbar region: Secondary | ICD-10-CM | POA: Diagnosis not present

## 2022-08-07 DIAGNOSIS — M62830 Muscle spasm of back: Secondary | ICD-10-CM | POA: Diagnosis not present

## 2022-08-07 DIAGNOSIS — M9901 Segmental and somatic dysfunction of cervical region: Secondary | ICD-10-CM | POA: Diagnosis not present

## 2022-08-13 DIAGNOSIS — H25813 Combined forms of age-related cataract, bilateral: Secondary | ICD-10-CM | POA: Diagnosis not present

## 2022-08-13 DIAGNOSIS — H35413 Lattice degeneration of retina, bilateral: Secondary | ICD-10-CM | POA: Diagnosis not present

## 2022-08-13 DIAGNOSIS — H35371 Puckering of macula, right eye: Secondary | ICD-10-CM | POA: Diagnosis not present

## 2022-08-13 DIAGNOSIS — Z85820 Personal history of malignant melanoma of skin: Secondary | ICD-10-CM | POA: Diagnosis not present

## 2022-08-13 DIAGNOSIS — H31093 Other chorioretinal scars, bilateral: Secondary | ICD-10-CM | POA: Diagnosis not present

## 2022-08-16 ENCOUNTER — Other Ambulatory Visit: Payer: Self-pay | Admitting: Internal Medicine

## 2022-08-16 DIAGNOSIS — E039 Hypothyroidism, unspecified: Secondary | ICD-10-CM

## 2022-08-16 NOTE — Addendum Note (Signed)
Addended byJane Canary on: 08/16/2022 09:30 AM   Modules accepted: Orders

## 2022-08-19 ENCOUNTER — Ambulatory Visit: Payer: Medicare HMO | Admitting: Internal Medicine

## 2022-08-19 ENCOUNTER — Encounter: Payer: Self-pay | Admitting: Internal Medicine

## 2022-08-19 ENCOUNTER — Ambulatory Visit: Payer: Medicare HMO

## 2022-08-19 ENCOUNTER — Other Ambulatory Visit: Payer: Medicare HMO

## 2022-08-19 ENCOUNTER — Inpatient Hospital Stay: Payer: Medicare HMO

## 2022-08-19 ENCOUNTER — Inpatient Hospital Stay: Payer: Medicare HMO | Attending: Oncology

## 2022-08-19 ENCOUNTER — Inpatient Hospital Stay: Payer: Medicare HMO | Admitting: Internal Medicine

## 2022-08-19 VITALS — BP 133/81 | HR 62 | Temp 96.4°F | Wt 188.4 lb

## 2022-08-19 DIAGNOSIS — C4359 Malignant melanoma of other part of trunk: Secondary | ICD-10-CM | POA: Insufficient documentation

## 2022-08-19 DIAGNOSIS — C773 Secondary and unspecified malignant neoplasm of axilla and upper limb lymph nodes: Secondary | ICD-10-CM | POA: Diagnosis not present

## 2022-08-19 DIAGNOSIS — C779 Secondary and unspecified malignant neoplasm of lymph node, unspecified: Secondary | ICD-10-CM

## 2022-08-19 DIAGNOSIS — Z5112 Encounter for antineoplastic immunotherapy: Secondary | ICD-10-CM | POA: Insufficient documentation

## 2022-08-19 DIAGNOSIS — Z79899 Other long term (current) drug therapy: Secondary | ICD-10-CM | POA: Diagnosis not present

## 2022-08-19 DIAGNOSIS — E039 Hypothyroidism, unspecified: Secondary | ICD-10-CM

## 2022-08-19 LAB — COMPREHENSIVE METABOLIC PANEL
ALT: 15 U/L (ref 0–44)
AST: 19 U/L (ref 15–41)
Albumin: 3.6 g/dL (ref 3.5–5.0)
Alkaline Phosphatase: 46 U/L (ref 38–126)
Anion gap: 8 (ref 5–15)
BUN: 19 mg/dL (ref 8–23)
CO2: 24 mmol/L (ref 22–32)
Calcium: 8.9 mg/dL (ref 8.9–10.3)
Chloride: 106 mmol/L (ref 98–111)
Creatinine, Ser: 1.1 mg/dL (ref 0.61–1.24)
GFR, Estimated: >60 mL/min (ref >60)
Glucose, Bld: 99 mg/dL (ref 70–99)
Potassium: 3.6 mmol/L (ref 3.5–5.1)
Sodium: 138 mmol/L (ref 135–145)
Total Bilirubin: 0.6 mg/dL (ref 0.3–1.2)
Total Protein: 5.9 g/dL — ABNORMAL LOW (ref 6.5–8.1)

## 2022-08-19 LAB — CBC WITH DIFFERENTIAL/PLATELET
Abs Immature Granulocytes: 0.01 10*3/uL (ref 0.00–0.07)
Basophils Absolute: 0 10*3/uL (ref 0.0–0.1)
Basophils Relative: 1 %
Eosinophils Absolute: 0.2 10*3/uL (ref 0.0–0.5)
Eosinophils Relative: 4 %
HCT: 44.2 % (ref 39.0–52.0)
Hemoglobin: 14.7 g/dL (ref 13.0–17.0)
Immature Granulocytes: 0 %
Lymphocytes Relative: 38 %
Lymphs Abs: 2 10*3/uL (ref 0.7–4.0)
MCH: 29.8 pg (ref 26.0–34.0)
MCHC: 33.3 g/dL (ref 30.0–36.0)
MCV: 89.5 fL (ref 80.0–100.0)
Monocytes Absolute: 0.5 10*3/uL (ref 0.1–1.0)
Monocytes Relative: 9 %
Neutro Abs: 2.7 10*3/uL (ref 1.7–7.7)
Neutrophils Relative %: 48 %
Platelets: 241 10*3/uL (ref 150–400)
RBC: 4.94 MIL/uL (ref 4.22–5.81)
RDW: 13.3 % (ref 11.5–15.5)
WBC: 5.4 10*3/uL (ref 4.0–10.5)
nRBC: 0 % (ref 0.0–0.2)

## 2022-08-19 MED ORDER — SODIUM CHLORIDE 0.9 % IV SOLN
Freq: Once | INTRAVENOUS | Status: AC
  Filled 2022-08-19: qty 250

## 2022-08-19 MED ORDER — SODIUM CHLORIDE 0.9 % IV SOLN
200.0000 mg | Freq: Once | INTRAVENOUS | Status: AC
  Administered 2022-08-19: 200 mg via INTRAVENOUS
  Filled 2022-08-19: qty 8

## 2022-08-19 NOTE — Patient Instructions (Signed)
[facility]  Discharge Instructions: Thank you for choosing [facility] to provide your oncology and hematology care.  If you have a lab appointment with the Cancer Center, please go directly to the Cancer Center and check in at the registration area.  Wear comfortable clothing and clothing appropriate for easy access to any Portacath or PICC line.   We strive to give you quality time with your provider. You may need to reschedule your appointment if you arrive late (15 or more minutes).  Arriving late affects you and other patients whose appointments are after yours.  Also, if you miss three or more appointments without notifying the office, you may be dismissed from the clinic at the provider's discretion.      For prescription refill requests, have your pharmacy contact our office and allow 72 hours for refills to be completed.    Today you received the following chemotherapy and/or immunotherapy agents Keytruda      To help prevent nausea and vomiting after your treatment, we encourage you to take your nausea medication as directed.  BELOW ARE SYMPTOMS THAT SHOULD BE REPORTED IMMEDIATELY: *FEVER GREATER THAN 100.4 F (38 C) OR HIGHER *CHILLS OR SWEATING *NAUSEA AND VOMITING THAT IS NOT CONTROLLED WITH YOUR NAUSEA MEDICATION *UNUSUAL SHORTNESS OF BREATH *UNUSUAL BRUISING OR BLEEDING *URINARY PROBLEMS (pain or burning when urinating, or frequent urination) *BOWEL PROBLEMS (unusual diarrhea, constipation, pain near the anus) TENDERNESS IN MOUTH AND THROAT WITH OR WITHOUT PRESENCE OF ULCERS (sore throat, sores in mouth, or a toothache) UNUSUAL RASH, SWELLING OR PAIN  UNUSUAL VAGINAL DISCHARGE OR ITCHING   Items with * indicate a potential emergency and should be followed up as soon as possible or go to the Emergency Department if any problems should occur.  Please show the CHEMOTHERAPY ALERT CARD or IMMUNOTHERAPY ALERT CARD at check-in to  the Emergency Department and triage nurse.  Should you have questions after your visit or need to cancel or reschedule your appointment, please contact [facility]  [phone] and follow the prompts.  Office hours are 8:00 a.m. to 4:30 p.m. Monday - Friday. Please note that voicemails left after 4:00 p.m. may not be returned until the following business day.  We are closed weekends and major holidays. You have access to a nurse at all times for urgent questions. Please call the main number to the clinic [phone] and follow the prompts.  For any non-urgent questions, you may also contact your provider using MyChart. We now offer e-Visits for anyone 18 and older to request care online for non-urgent symptoms. For details visit [email or web address].   Also download the MyChart app! Go to the app store, search "MyChart", open the app, select [facility], and log in with your MyChart username and password.  Masks are optional in the cancer centers. If you would like for your care team to wear a mask while they are taking care of you, please let them know. For doctor visits, patients may have with them one support person who is at least 68 years old. At this time, visitors are not allowed in the infusion area.   

## 2022-08-19 NOTE — Progress Notes (Signed)
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   There is sufficient material for ancillary molecular testing if needed  (block A3, A1, A2).   Comment:  Per provided outside pathology report the patient had an "at least pT1b"  malignant melanoma of the left lateral infraclavicular skin in 2015.  Touch preparations demonstrate predominantly discohesive large  pleomorphic cells, some with binucleation.  HE sections demonstrate  lymph node parenchyma involved by metastatic neoplasm.  The neoplastic  cells are pleomorphic with binucleation, focal intranuclear inclusions,  and associated tumor necrosis. The neoplastic cells demonstrate the  following pattern of immunoreactivity:  S100: Positive  SOX10: Positive  HMB-45: Negative  Melan-A: Negative  Super pancytokeratin: Negative  CD45: Negative  This pattern of immunoreactivity supports the above diagnosis.   # NOV 7th, 2022-neoadjuvant Keytruda cycle #1  # JAN, 26th, [name],  majority of these foci measure 1-2 mm in greatest dimension, with the largest measuring 4 mm.  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  Accompanied by his wife.   [name] 66 y.o.  male pleasant patient above history of recurrent melanoma left axillary lymphadenopathy-s/p surgery is currently on adjuvant Keytruda-with iatrogenic hypothyroidism /adrenal insufficiency is here for follow-up.   Denies any shortness of breath or cough.  No fever no chills.  No skin rash.  Review of Systems  Constitutional:  Negative for chills, diaphoresis, fever, malaise/fatigue and weight loss.  HENT:  Negative for nosebleeds and sore throat.   Eyes:  Negative for double vision.  Respiratory:  Negative for hemoptysis, sputum production, shortness of breath and wheezing.   Cardiovascular:  Negative for chest pain, palpitations, orthopnea and leg swelling.  Gastrointestinal:  Negative for abdominal pain, blood in stool, constipation, diarrhea, heartburn, melena, nausea and vomiting.  Genitourinary:  Negative for dysuria, frequency and urgency.  Musculoskeletal:  Positive for myalgias. Negative for back pain and joint pain.  Skin: Negative.  Negative for itching and rash.  Neurological:  Positive for tingling. Negative for dizziness, focal weakness and weakness.  Endo/Heme/Allergies:  Does not bruise/bleed easily.  Psychiatric/Behavioral:  Negative for depression. The patient is not nervous/anxious and does not have  insomnia.       PAST MEDICAL HISTORY :  Past Medical History:  Diagnosis Date   Basal cell carcinoma 03/10/2013   Right medial forehead. Excised, margins free.   Dysplastic nevus 11/05/2006   Mid back paraspinal, [name], close to edge. Excised 12/24/2006, margins free.   Dysplastic nevus 12/15/2008   Left lat. pectoral area. Moderate atypia, extends to one edge.    Dysplastic nevus 12/07/2009   Left mid side. Moderate atypia, close to margin.   Dysplastic nevus 12/07/2009   Right posterior waistline. Moderate atypia, close to margin.   Dysplastic nevus 02/10/2014   Left paraspinal mid back. Moderate atypia, lateral and deep margin involved.    Dysplastic nevus 06/16/2014   Left epigastric. Mild atypia, deep margin involved.    GERD (gastroesophageal reflux disease)    Hx of basal cell carcinoma    multiple sites   Hx of malignant melanoma 11/11/2013   L lateral infraclavicular, superficial spreading, Breslow's 0.19m, Clark's level III   Hypothyroidism    [place]   Left lateral infraclavicular. MM, superficial spreading. Anatomic level III. Tumor thickness 0.649m Excised 11/24/2013, margins free.    [place]   L axilla, excised by Dr. ByFleet Contrasfollowd by Dr. BrBurlene Arntith KeBeryle Flock  PAST SURGICAL HISTORY :   Past Surgical History:  Procedure Laterality Date   AXILLARY LYMPH NODE DISSECTION Left 11/02/2021   Procedure: AXILLARY LYMPH NODE DISSECTION;  [name];  [name];  Service: General;  Laterality: Left;   COLONOSCOPY WITH PROPOFOL N/A 01/20/2017   Procedure: COLONOSCOPY WITH PROPOFOL;  [name];  [facility];  Service: Endoscopy;  [name];   ESOPHAGOGASTRODUODENOSCOPY (EGD) WITH ESOPHAGEAL DILATION     MELANOMA EXCISION Right 11/24/2013   lateral infraclavicular   WISDOM TOOTH EXTRACTION     WRIST SURGERY Left  2022    FAMILY HISTORY :   Family History  Problem Relation Age of Onset   Macular degeneration Mother    Hyperlipidemia Mother    Heart disease Father    Heart attack Father    Lung cancer Father        metastasized    SOCIAL HISTORY:   Social History   Tobacco Use   Smoking status: Never   Smokeless tobacco: Never  Vaping Use   Vaping Use: Never used  Substance Use Topics   Alcohol use: Yes    Comment: about 1-3 glass of wine with dinner   Drug use: No    ALLERGIES:  has No Known Allergies.  MEDICATIONS:  Current Outpatient Medications  Medication Sig Dispense Refill   amiodarone (PACERONE) 200 MG tablet Take 200 mg by mouth daily.     apixaban (ELIQUIS) 2.5 MG TABS tablet Take 2.5 mg by mouth 2 (two) times daily.     aspirin 81 MG tablet Take 81 mg by mouth daily.     cyclobenzaprine (FLEXERIL) 5 MG tablet Take 5 mg by mouth 3 (three) times daily as needed for muscle spasms.     hydrocortisone (CORTEF) 10 MG tablet TAKE 2 PILLS IN AM 1 PILL AT NIGHT. DO NOT STOP UNTIL DIRECTED. TAKE WITH FOOD. 90 tablet 1   levothyroxine (SYNTHROID) 125 MCG tablet Take 1 tablet (125 mcg total) by mouth daily before breakfast. 90 tablet 3   losartan (COZAAR)  25 MG tablet Take 25 mg by mouth daily.     metoprolol tartrate (LOPRESSOR) 25 MG tablet Take 25 mg by mouth 2 (two) times daily.     Multiple Vitamins tablet Take 1 tablet by mouth daily.     Multiple Vitamins-Minerals (PRESERVISION AREDS 2 PO) Take 1 capsule by mouth in the morning and at bedtime.     pantoprazole (PROTONIX) 20 MG tablet Take 20 mg by mouth daily.  0   Pembrolizumab (KEYTRUDA IV) Inject 1 Dose into the vein every 21 ( twenty-one) days.     potassium chloride SA (KLOR-CON M) 20 MEQ tablet Take 20 mEq by mouth 2 (two) times daily.     rosuvastatin (CRESTOR) 20 MG tablet Take 20 mg by mouth daily.     tiZANidine (ZANAFLEX) 4 MG capsule Take 4 mg by mouth 3 (three) times daily.     traMADol (ULTRAM) 50 MG tablet  Take 50 mg by mouth every 6 (six) hours as needed.     fluorouracil (EFUDEX) 5 % cream Apply to scalp twice a day for 7 days, 40 g 1   mometasone (ELOCON) 0.1 % cream Apply to aa's rash BID up to two weeks. 15 g 0   No current facility-administered medications for this visit.    PHYSICAL EXAMINATION: ECOG PERFORMANCE STATUS: 0 - Asymptomatic  BP 133/81 (BP Location: Left Arm, Patient Position: Sitting)   Pulse 62   Temp (!) 96.4 F (35.8 C) (Tympanic)   Wt 188 lb 6.4 oz (85.5 kg)   BMI 24.86 kg/m   Filed Weights   08/19/22 0943  Weight: 188 lb 6.4 oz (85.5 kg)   Physical Exam Vitals and nursing note reviewed.  HENT:     Head: Normocephalic and atraumatic.     Mouth/Throat:     Pharynx: Oropharynx is clear.  Eyes:     Extraocular Movements: Extraocular movements intact.     Pupils: Pupils are equal, round, and reactive to light.  Cardiovascular:     Rate and Rhythm: Normal rate and regular rhythm.  Pulmonary:     Comments: Decreased breath sounds bilaterally.  Abdominal:     Palpations: Abdomen is soft.  Musculoskeletal:        General: Normal range of motion.     Cervical back: Normal range of motion.  Skin:    General: Skin is warm.  Neurological:     General: No focal deficit present.     Mental Status: He is alert and oriented to person, place, and time.  Psychiatric:        Behavior: Behavior normal.        Judgment: Judgment normal.       LABORATORY DATA:  I have reviewed the data as listed    Component Value Date/Time   NA 138 08/19/2022 0931   NA 144 05/16/2022 0953   K 3.6 08/19/2022 0931   CL 106 08/19/2022 0931   CO2 24 08/19/2022 0931   GLUCOSE 99 08/19/2022 0931   BUN 19 08/19/2022 0931   BUN 12 05/16/2022 0953   CREATININE 1.10 08/19/2022 0931   CALCIUM 8.9 08/19/2022 0931   PROT 5.9 (L) 08/19/2022 0931   PROT 5.7 (L) 05/16/2022 0953   ALBUMIN 3.6 08/19/2022 0931   ALBUMIN 3.9 05/16/2022 0953   AST 19 08/19/2022 0931   ALT 15  08/19/2022 0931   ALKPHOS 46 08/19/2022 0931   BILITOT 0.6 08/19/2022 0931   BILITOT 0.5 05/16/2022 0953   GFRNONAA >60 08/19/2022  0931   GFRAA 75 05/10/2020 1017    No results found for: "SPEP", "UPEP"  Lab Results  Component Value Date   WBC 5.4 08/19/2022   NEUTROABS 2.7 08/19/2022   HGB 14.7 08/19/2022   HCT 44.2 08/19/2022   MCV 89.5 08/19/2022   PLT 241 08/19/2022      Chemistry      Component Value Date/Time   NA 138 08/19/2022 0931   NA 144 05/16/2022 0953   K 3.6 08/19/2022 0931   CL 106 08/19/2022 0931   CO2 24 08/19/2022 0931   BUN 19 08/19/2022 0931   BUN 12 05/16/2022 0953   CREATININE 1.10 08/19/2022 0931   GLU 96 10/19/2014 0000      Component Value Date/Time   CALCIUM 8.9 08/19/2022 0931   ALKPHOS 46 08/19/2022 0931   AST 19 08/19/2022 0931   ALT 15 08/19/2022 0931   BILITOT 0.6 08/19/2022 0931   BILITOT 0.5 05/16/2022 0953       RADIOGRAPHIC STUDIES: I have personally reviewed the radiological images as listed and agreed with the findings in the report. No results found.   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  S/p neoadjuvant Keytruda x4- -Status post axillary lymph node dissection-with residual micrometastatic disease.  Patient currently on adjuvant Keytruda.  Tolerating with moderate-severe difficulties see below  # proceed with  [place] #16 today of planned total 18. Labs today reviewed. [place] with treatment.    #Endocrinopathies: Hypothyroidism/adrenal insufficiency: [s/p evaluation- SEP 2023- Endocrinology-] iatrogenic hypothyroidism [AUG 2023; TSH- 11]-on Synthroid 150 mcg once a day; SEP 2023- TSH 0.15; Normal T3/T4-. Monitor for now.  Adrenal insufficiency -extreme fatigue--currently on hydrocortsione 20 mg in the morning and 10 mg at night.  Thyroid panel pending    # DISPOSITION:   # Keytruda today # follow up in 3 weeks/Nov 13th- MD;  labs- cbc/cmp;Keytruda-   Dr.B.      Orders Placed This Encounter  Procedures   CBC with Differential    Standing Status:   Future    Standing Expiration Date:   09/10/2023   Comprehensive metabolic panel    Standing Status:   Future    Standing Expiration Date:   09/10/2023    All questions were answered. The patient knows to call the clinic with any problems, questions or concerns.      Jane Canary, MD 08/19/2022 4:28 PM

## 2022-08-19 NOTE — Progress Notes (Signed)
No new concerns today 

## 2022-08-20 ENCOUNTER — Encounter: Payer: Self-pay | Admitting: *Deleted

## 2022-08-20 DIAGNOSIS — C779 Secondary and unspecified malignant neoplasm of lymph node, unspecified: Secondary | ICD-10-CM | POA: Diagnosis not present

## 2022-08-20 LAB — THYROID PANEL WITH TSH
Free Thyroxine Index: 2.3 (ref 1.2–4.9)
T3 Uptake Ratio: 30 % (ref 24–39)
T4, Total: 7.7 ug/dL (ref 4.5–12.0)
TSH: 0.492 u[IU]/mL (ref 0.450–4.500)

## 2022-08-20 LAB — THYROID PEROXIDASE ANTIBODY: Thyroperoxidase Ab SerPl-aCnc: <9 IU/mL (ref 0–34)

## 2022-08-20 NOTE — Patient Instructions (Signed)
Results of thyroid panel with tsh and thyroid peroxidase antibody faxed to Dr. Chalmers Cater as requested. Fax # [phone].

## 2022-08-27 ENCOUNTER — Other Ambulatory Visit: Payer: Self-pay | Admitting: Internal Medicine

## 2022-09-04 DIAGNOSIS — M62830 Muscle spasm of back: Secondary | ICD-10-CM | POA: Diagnosis not present

## 2022-09-04 DIAGNOSIS — M9903 Segmental and somatic dysfunction of lumbar region: Secondary | ICD-10-CM | POA: Diagnosis not present

## 2022-09-04 DIAGNOSIS — M9901 Segmental and somatic dysfunction of cervical region: Secondary | ICD-10-CM | POA: Diagnosis not present

## 2022-09-04 DIAGNOSIS — M5033 Other cervical disc degeneration, cervicothoracic region: Secondary | ICD-10-CM | POA: Diagnosis not present

## 2022-09-05 ENCOUNTER — Inpatient Hospital Stay (HOSPITAL_BASED_OUTPATIENT_CLINIC_OR_DEPARTMENT_OTHER): Payer: Medicare HMO | Admitting: Nurse Practitioner

## 2022-09-05 ENCOUNTER — Encounter: Payer: Self-pay | Admitting: Internal Medicine

## 2022-09-05 DIAGNOSIS — C773 Secondary and unspecified malignant neoplasm of axilla and upper limb lymph nodes: Secondary | ICD-10-CM | POA: Diagnosis not present

## 2022-09-05 DIAGNOSIS — E2740 Unspecified adrenocortical insufficiency: Secondary | ICD-10-CM | POA: Diagnosis not present

## 2022-09-05 DIAGNOSIS — U071 COVID-19: Secondary | ICD-10-CM

## 2022-09-05 DIAGNOSIS — C779 Secondary and unspecified malignant neoplasm of lymph node, unspecified: Secondary | ICD-10-CM

## 2022-09-05 MED ORDER — NIRMATRELVIR/RITONAVIR (PAXLOVID)TABLET: 3.0000 | ORAL_TABLET | Freq: Two times a day (BID) | ORAL | 0 refills | Status: AC

## 2022-09-05 MED ORDER — BENZONATATE 200 MG PO CAPS: 200.0000 mg | ORAL_CAPSULE | Freq: Three times a day (TID) | ORAL | 0 refills | Status: DC | PRN

## 2022-09-05 NOTE — Progress Notes (Signed)
Virtual Visit Progress Note [place] [facility] [place] connected with Parris Cudworth [facility] on 09/05/22 at  4:30 PM EST by telephone visit and verified that I am speaking with the correct person using two identifiers.   I discussed the limitations, risks, security and privacy concerns of performing an evaluation and management service by telemedicine and the availability of in-person appointments. I also discussed with the patient that there may be a patient responsible charge related to this service. The patient expressed understanding and agreed to proceed.   Other persons participating in the visit and their role in the encounter: patient's wife   Patient's location: home  Provider's location: clinic  Chief Complaint: Positive covid test    Patient Care Team: Jerrol Banana., MD as PCP - General (Family Medicine) Cammie Sickle, MD as Consulting Physician (Oncology)   Name of the patient: [name]  [id number]  [birth year]   Date of visit: 09/05/22  History of Presenting Illness- Patient is 66 year old male currently receiving adjuvant keytruda for metastatic melanoma, who agrees to evaluation via telemedicine for positive home covid test. Symptoms began yesterday with congestion, sore throat, malaise. He developed cough today. Took covid test and it was positive. He has been vaccinated. Endorses fever 101.5. Has been taking tylenol which improves symptoms. Checked oxygen via home monitor and saw readings of 95%. No chest pain or shortness of breath. Wife has similar symptoms. This is his first time having covid.   Review of systems- Review of Systems  Constitutional:  Positive for malaise/fatigue. Negative for chills, diaphoresis, fever and weight loss.  HENT:  Positive for congestion and sore throat. Negative for ear discharge, ear pain, nosebleeds and sinus pain.   Eyes:  Negative for pain and redness.  Respiratory:  Positive for cough.  Negative for hemoptysis, sputum production, shortness of breath, wheezing and stridor.   Cardiovascular:  Negative for chest pain, palpitations and leg swelling.  Gastrointestinal:  Negative for abdominal pain, constipation, diarrhea, nausea and vomiting.  Musculoskeletal:  Positive for myalgias. Negative for falls and joint pain.  Skin:  Negative for rash.  Neurological:  Negative for dizziness, loss of consciousness, weakness and headaches.  Psychiatric/Behavioral:  Negative for depression. The patient is not nervous/anxious and does not have insomnia.   All other systems reviewed and are negative.    No Known Allergies  Past Medical History:  Diagnosis Date   Basal cell carcinoma 03/10/2013   Right medial forehead. Excised, margins free.   Dysplastic nevus 11/05/2006   Mid back paraspinal, [name], close to edge. Excised 12/24/2006, margins free.   Dysplastic nevus 12/15/2008   Left lat. pectoral area. Moderate atypia, extends to one edge.    Dysplastic nevus 12/07/2009   Left mid side. Moderate atypia, close to margin.   Dysplastic nevus 12/07/2009   Right posterior waistline. Moderate atypia, close to margin.   Dysplastic nevus 02/10/2014   Left paraspinal mid back. Moderate atypia, lateral and deep margin involved.    Dysplastic nevus 06/16/2014   Left epigastric. Mild atypia, deep margin involved.    GERD (gastroesophageal reflux disease)    Hx of basal cell carcinoma    multiple sites   Hx of malignant melanoma 11/11/2013   L lateral infraclavicular, superficial spreading, Breslow's 0.12m, Clark's level III   Hypothyroidism    [place]   Left lateral infraclavicular. MM, superficial spreading. Anatomic level III. Tumor thickness 0.625m Excised  11/24/2013, margins free.    Metastatic melanoma ([place]) 07/2021   L axilla, excised by Dr. Fleet Contras, followd by Dr. Burlene Arnt with [name]    Past Surgical History:   Procedure Laterality Date   AXILLARY LYMPH NODE DISSECTION Left 11/02/2021   Procedure: AXILLARY LYMPH NODE DISSECTION;  Surgeon: Xerxes Bellow, MD;  [name];  Service: General;  Laterality: Left;   COLONOSCOPY WITH PROPOFOL N/A 01/20/2017   Procedure: COLONOSCOPY WITH PROPOFOL;  Surgeon: Lollie Sails, MD;  Location[facility] END[facility]COPY;  Service: Endoscopy;  [name];   ESOPHAGOGASTRODUODENOSCOPY (EGD) WITH ESOPHAGEAL DILATION     MELANOMA EXCISION Right 11/24/2013   lateral infraclavicular   WISDOM TOOTH EXTRACTION     WRIST SURGERY Left 2022    Social History   Socioeconomic History   Marital status: Married    Spouse name: Not on file   Number of children: Not on file   Years of education: Not on file   Highest education level: Not on file  Occupational History   Not on file  Tobacco Use   Smoking status: Never   Smokeless tobacco: Never  Vaping Use   Vaping Use: Never used  Substance and Sexual Activity   Alcohol use: Yes    Comment: about 1-3 glass of wine with dinner   Drug use: No   Sexual activity: Not on file  Other Topics Concern   Not on file  Social History Narrative   Lives with wife in [place]; no children; used to be in banking retd. Never smoked; social alcohol.    Social Determinants of Health   Financial Resource Strain: Not on file  Food Insecurity: Not on file  Transportation Needs: Not on file  Physical Activity: Not on file  Stress: Not on file  Social Connections: Not on file  Intimate Partner Violence: Not on file    Immunization History  Administered Date(s) Administered   Fluad Quad(high Dose 65+) 06/08/2019, 07/17/2021   Influenza,inj,Quad PF,6+ Mos 08/01/2016, 07/15/2018   Influenza,inj,Quad PF,6-35 Mos 05/29/2017   Influenza-Unspecified 08/07/2015   Moderna Covid-19 Vaccine Bivalent Booster 87yr & up 07/19/2021   Pneumococcal Polysaccharide-23 05/10/2019   Td 10/29/2016   Tdap 12/01/2006   Zoster,  Live 10/19/2014    Family History  Problem Relation Age of Onset   Macular degeneration Mother    Hyperlipidemia Mother    Heart disease Father    Heart attack Father    Lung cancer Father        metastasized     Current Outpatient Medications:    amiodarone (PACERONE) 200 MG tablet, Take 200 mg by mouth daily., Disp: , Rfl:    apixaban (ELIQUIS) 2.5 MG TABS tablet, Take 2.5 mg by mouth 2 (two) times daily., Disp: , Rfl:    aspirin 81 MG tablet, Take 81 mg by mouth daily., Disp: , Rfl:    cyclobenzaprine (FLEXERIL) 5 MG tablet, Take 5 mg by mouth 3 (three) times daily as needed for muscle spasms., Disp: , Rfl:    fluorouracil (EFUDEX) 5 % cream, Apply to scalp twice a day for 7 days,, Disp: 40 g, Rfl: 1   hydrocortisone (CORTEF) 10 MG tablet, TAKE 2 PILLS IN AM 1 PILL AT NIGHT. DO NOT STOP UNTIL DIRECTED. TAKE WITH FOOD., Disp: 90 tablet, Rfl: 1   levothyroxine (SYNTHROID) 125 MCG tablet, Take 1 tablet (125 mcg total) by mouth daily before breakfast., Disp: 90 tablet, Rfl: 3   losartan (COZAAR) 25 MG tablet, Take 25 mg  by mouth daily., Disp: , Rfl:    metoprolol tartrate (LOPRESSOR) 25 MG tablet, Take 25 mg by mouth 2 (two) times daily., Disp: , Rfl:    mometasone (ELOCON) 0.1 % cream, Apply to aa's rash BID up to two weeks., Disp: 15 g, Rfl: 0   Multiple Vitamins tablet, Take 1 tablet by mouth daily., Disp: , Rfl:    Multiple Vitamins-Minerals (PRESERVISION AREDS 2 PO), Take 1 capsule by mouth in the morning and at bedtime., Disp: , Rfl:    pantoprazole (PROTONIX) 20 MG tablet, Take 20 mg by mouth daily., Disp: , Rfl: 0   Pembrolizumab (KEYTRUDA IV), Inject 1 Dose into the vein every 21 ( twenty-one) days., Disp: , Rfl:    potassium chloride SA (KLOR-CON M) 20 MEQ tablet, Take 20 mEq by mouth 2 (two) times daily., Disp: , Rfl:    rosuvastatin (CRESTOR) 20 MG tablet, Take 20 mg by mouth daily., Disp: , Rfl:    tiZANidine (ZANAFLEX) 4 MG capsule, Take 4 mg by mouth 3 (three) times  daily., Disp: , Rfl:    traMADol (ULTRAM) 50 MG tablet, Take 50 mg by mouth every 6 (six) hours as needed., Disp: , Rfl:   Physical exam: Exam limited due to telemedicine Physical Exam HENT:     Nose: Congestion present.  Pulmonary:     Comments: cough Neurological:     Mental Status: He is oriented to person, place, and time.          Latest Ref Rng & Units 08/19/2022    9:31 AM 07/30/2022    9:42 AM 07/09/2022    9:28 AM  CBC  WBC 4.0 - 10.5 K/uL 5.4  5.2  7.1   Hemoglobin 13.0 - 17.0 g/dL 14.7  14.9  14.3   Hematocrit 39.0 - 52.0 % 44.2  45.7  42.4   Platelets 150 - 400 K/uL 241  204  263       Latest Ref Rng & Units 08/19/2022    9:31 AM 07/30/2022    9:42 AM 07/09/2022    9:28 AM  CMP  Glucose 70 - 99 mg/dL 99  84  107   BUN 8 - 23 mg/dL '19  21  20   '$ Creatinine 0.61 - 1.24 mg/dL 1.10  0.96  1.12   Sodium 135 - 145 mmol/L 138  138  138   Potassium 3.5 - 5.1 mmol/L 3.6  3.8  3.5   Chloride 98 - 111 mmol/L 106  107  107   CO2 22 - 32 mmol/L '24  25  24   '$ Calcium 8.9 - 10.3 mg/dL 8.9  8.9  8.7   Total Protein 6.5 - 8.1 g/dL 5.9  6.2  6.0   Total Bilirubin 0.3 - 1.2 mg/dL 0.6  0.5  0.5   Alkaline Phos 38 - 126 U/L 46  47  66   AST 15 - 41 U/L '19  20  18   '$ ALT 0 - 44 U/L '15  16  16      '$ Assessment and plan- Patient is a 66 y.o. male with metastatic melanoma, currently on adjuvant immunotherapy with pembrolizumab who presents to [facility] for   1) Covid 19 Virus Infection - I advised patient to stay well hydrated, particularly in the event of sustained or high fevers, in whom insensible fluid losses may be significant  - Cough that is persistent, interferes with sleep, or causes discomfort can be managed with an over-the-counter cough medication (eg, dextromethorphan)  or prescription benzonatate, 100 to 200 mg orally three times daily as needed -Take Delsym for cough and Mucinex to thin secretions - Take Tylenol or pain reliever every 4-6 hours as needed for pain/fever/body  ache. Don't exceed 3000 mg in 24 hour period - Reviewed symptoms that would warrant ER or hospital care.  - Encouraged patient to monitor oxygen level with pulse oximeter, how to troubleshoot, and to seek ER care for SpO2 < 92%.  - CDC criteria for quarantine and isolation reviewed.   - start paxlovid. Reviewed that paxlovid only has ~40% improvement in avoidance of severe illness with current predominant strains, however, would recommend use in this high risk patient. Labs reviewed in addition to medications. Also reviewed side effects and use.  - will also send prescription for tessalon for cough.   2) Chest congestion  - Flonase or nasocort twice a day as needed. Can also use Afrin for nasal congestion no longer than 5 days.   3) Adrenal insufficiency - continue hydrocortisone as prescribed  Disposition: Will update Dr. Rogue Bussing on patient's status so that appointments for treatment can be rescheduled.   Visit Diagnosis 1. COVID-19 virus infection    Patient expressed understanding and was in agreement with this plan. He also understands that He can call clinic at any time with any questions, concerns, or complaints.   I discussed the assessment and treatment plan with the patient. The patient was provided an opportunity to ask questions and all were answered. The patient agreed with the plan and demonstrated an understanding of the instructions.   The patient was advised to call back or seek an in-person evaluation if the symptoms worsen or if the condition fails to improve as anticipated.   I spent 30 minutes on this telephone encounter.   Thank you for allowing me to participate in the care of this very pleasant patient.   Beckey Rutter, DNP, AGNP-C [place]

## 2022-09-06 ENCOUNTER — Telehealth: Payer: Self-pay | Admitting: Nurse Practitioner

## 2022-09-06 ENCOUNTER — Other Ambulatory Visit: Payer: Self-pay | Admitting: Internal Medicine

## 2022-09-06 ENCOUNTER — Encounter: Payer: Self-pay | Admitting: Internal Medicine

## 2022-09-06 DIAGNOSIS — U071 COVID-19: Secondary | ICD-10-CM

## 2022-09-06 NOTE — Progress Notes (Signed)
Spoke to patient regarding recent COVID infection.  On Paxlovid.   Recommend pushing out all the treatments by 2 weeks.   BC-please reschedule the patient follow-up on 12/18- X-MD; labs; [place] as ordered. GB

## 2022-09-06 NOTE — Telephone Encounter (Signed)
Called patient to f/u. Doing well. Reviewed oxygen levels (95%+), heart rate (70s), temp (99.1). Symptoms are otherwise stable. He's started paxlovid and tolerating well.

## 2022-09-09 ENCOUNTER — Inpatient Hospital Stay: Payer: Medicare HMO

## 2022-09-09 ENCOUNTER — Inpatient Hospital Stay: Payer: Medicare HMO | Admitting: Internal Medicine

## 2022-09-18 ENCOUNTER — Ambulatory Visit (INDEPENDENT_AMBULATORY_CARE_PROVIDER_SITE_OTHER): Payer: Medicare HMO | Admitting: Dermatology

## 2022-09-18 DIAGNOSIS — L814 Other melanin hyperpigmentation: Secondary | ICD-10-CM

## 2022-09-18 DIAGNOSIS — Z85820 Personal history of malignant melanoma of skin: Secondary | ICD-10-CM | POA: Diagnosis not present

## 2022-09-18 DIAGNOSIS — C779 Secondary and unspecified malignant neoplasm of lymph node, unspecified: Secondary | ICD-10-CM

## 2022-09-18 DIAGNOSIS — L821 Other seborrheic keratosis: Secondary | ICD-10-CM

## 2022-09-18 DIAGNOSIS — Z85828 Personal history of other malignant neoplasm of skin: Secondary | ICD-10-CM

## 2022-09-18 DIAGNOSIS — D229 Melanocytic nevi, unspecified: Secondary | ICD-10-CM | POA: Diagnosis not present

## 2022-09-18 DIAGNOSIS — L570 Actinic keratosis: Secondary | ICD-10-CM

## 2022-09-18 DIAGNOSIS — Z86018 Personal history of other benign neoplasm: Secondary | ICD-10-CM

## 2022-09-18 DIAGNOSIS — Z1283 Encounter for screening for malignant neoplasm of skin: Secondary | ICD-10-CM

## 2022-09-18 DIAGNOSIS — L578 Other skin changes due to chronic exposure to nonionizing radiation: Secondary | ICD-10-CM | POA: Diagnosis not present

## 2022-09-18 NOTE — Patient Instructions (Signed)
Cryotherapy Aftercare  Wash gently with soap and water everyday.   Apply Vaseline and Band-Aid daily until healed.     Due to recent changes in healthcare laws, you may see results of your pathology and/or laboratory studies on MyChart before the doctors have had a chance to review them. We understand that in some cases there may be results that are confusing or concerning to you. Please understand that not all results are received at the same time and often the doctors may need to interpret multiple results in order to provide you with the best plan of care or course of treatment. Therefore, we ask that you please give us 2 business days to thoroughly review all your results before contacting the office for clarification. Should we see a critical lab result, you will be contacted sooner.   If You Need Anything After Your Visit  If you have any questions or concerns for your doctor, please call our main line at [phone] and press option 4 to reach your doctor's medical assistant. If no one answers, please leave a voicemail as directed and we will return your call as soon as possible. Messages left after 4 pm will be answered the following business day.   You may also send us a message via MyChart. We typically respond to MyChart messages within 1-2 business days.  For prescription refills, please ask your pharmacy to contact our office. Our fax number is [phone].  If you have an urgent issue when the clinic is closed that cannot wait until the next business day, you can page your doctor at the number below.    Please note that while we do our best to be available for urgent issues outside of office hours, we are not available 24/7.   If you have an urgent issue and are unable to reach us, you may choose to seek medical care at your doctor's office, retail clinic, urgent care center, or emergency room.  If you have a medical emergency, please immediately call 911 or go to the  emergency department.  Pager Numbers  - Dr. Kowalski: [phone]  - Dr. Moye: [phone]  - Dr. Stewart: [phone]  In the event of inclement weather, please call our main line at [phone] for an update on the status of any delays or closures.  Dermatology Medication Tips: Please keep the boxes that topical medications come in in order to help keep track of the instructions about where and how to use these. Pharmacies typically print the medication instructions only on the boxes and not directly on the medication tubes.   If your medication is too expensive, please contact our office at [phone] option 4 or send us a message through MyChart.   We are unable to tell what your co-pay for medications will be in advance as this is different depending on your insurance coverage. However, we may be able to find a substitute medication at lower cost or fill out paperwork to get insurance to cover a needed medication.   If a prior authorization is required to get your medication covered by your insurance company, please allow us 1-2 business days to complete this process.  Drug prices often vary depending on where the prescription is filled and some pharmacies may offer cheaper prices.  The website www.goodrx.com contains coupons for medications through different pharmacies. The prices here do not account for what the cost may be with help from insurance (it may be cheaper with your insurance), but the website can   give you the price if you did not use any insurance.  - You can print the associated coupon and take it with your prescription to the pharmacy.  - You may also stop by our office during regular business hours and pick up a GoodRx coupon card.  - If you need your prescription sent electronically to a different pharmacy, notify our office through [place] MyChart or by phone at [phone] option 4.     Si Usted Necesita Algo Despus de Su Visita  Tambin puede  enviarnos un mensaje a travs de MyChart. Por lo general respondemos a los mensajes de MyChart en el transcurso de 1 a 2 das hbiles.  Para renovar recetas, por favor pida a su farmacia que se ponga en contacto con nuestra oficina. Nuestro nmero de fax es el [phone].  Si tiene un asunto urgente cuando la clnica est cerrada y que no puede esperar hasta el siguiente da hbil, puede llamar/localizar a su doctor(a) al nmero que aparece a continuacin.   Por favor, tenga en cuenta que aunque hacemos todo lo posible para estar disponibles para asuntos urgentes fuera del horario de oficina, no estamos disponibles las 24 horas del da, los 7 das de la semana.   Si tiene un problema urgente y no puede comunicarse con nosotros, puede optar por buscar atencin mdica  en el consultorio de su doctor(a), en una clnica privada, en un centro de atencin urgente o en una sala de emergencias.  Si tiene una emergencia mdica, por favor llame inmediatamente al 911 o vaya a la sala de emergencias.  Nmeros de bper  - Dr. Kowalski: [phone]  - Dra. Moye: [phone]  - Dra. Stewart: [phone]  En caso de inclemencias del tiempo, por favor llame a nuestra lnea principal al [phone] para una actualizacin sobre el estado de cualquier retraso o cierre.  Consejos para la medicacin en dermatologa: Por favor, guarde las cajas en las que vienen los medicamentos de uso tpico para ayudarle a seguir las instrucciones sobre dnde y cmo usarlos. Las farmacias generalmente imprimen las instrucciones del medicamento slo en las cajas y no directamente en los tubos del medicamento.   Si su medicamento es muy caro, por favor, pngase en contacto con nuestra oficina llamando al [phone] y presione la opcin 4 o envenos un mensaje a travs de MyChart.   No podemos decirle cul ser su copago por los medicamentos por adelantado ya que esto es diferente dependiendo de la cobertura de su seguro.  Sin embargo, es posible que podamos encontrar un medicamento sustituto a menor costo o llenar un formulario para que el seguro cubra el medicamento que se considera necesario.   Si se requiere una autorizacin previa para que su compaa de seguros cubra su medicamento, por favor permtanos de 1 a 2 das hbiles para completar este proceso.  Los precios de los medicamentos varan con frecuencia dependiendo del lugar de dnde se surte la receta y alguna farmacias pueden ofrecer precios ms baratos.  El sitio web www.goodrx.com tiene cupones para medicamentos de diferentes farmacias. Los precios aqu no tienen en cuenta lo que podra costar con la ayuda del seguro (puede ser ms barato con su seguro), pero el sitio web puede darle el precio si no utiliz ningn seguro.  - Puede imprimir el cupn correspondiente y llevarlo con su receta a la farmacia.  - Tambin puede pasar por nuestra oficina durante el horario de atencin regular y recoger una tarjeta de cupones de GoodRx.  -   Si necesita que su receta se enve electrnicamente a una farmacia diferente, informe a nuestra oficina a travs de MyChart de [place] o por telfono llamando al [phone] y presione la opcin 4.  

## 2022-09-18 NOTE — Progress Notes (Signed)
Follow-Up Visit   Subjective  [name] is a 66 y.o. male who presents for the following: Other (History of Melanoma and Metastatic Melanoma - The patient presents for Total-Body Skin Exam (TBSE) for skin cancer screening and mole check.  The patient has spots, moles and lesions to be evaluated, some may be new or changing and the patient has concerns that these could be cancer. Had some aching feelings on his left side a couple of weeks ago but feels fine today./).  The following portions of the chart were reviewed this encounter and updated as appropriate:   Tobacco  Allergies  Meds  Problems  Med Hx  Surg Hx  Fam Hx     Review of Systems:  No other skin or systemic complaints except as noted in HPI or Assessment and Plan.  Objective  Well appearing patient in no apparent distress; mood and affect are within normal limits.  A full examination was performed including scalp, head, eyes, ears, nose, lips, neck, chest, axillae, abdomen, back, buttocks, bilateral upper extremities, bilateral lower extremities, hands, feet, fingers, toes, fingernails, and toenails. All findings within normal limits unless otherwise noted below.  Nose, scalp x 7 (8) Erythematous thin papules/macules with gritty scale.    Assessment & Plan   History of Basal Cell Carcinoma of the Skin - No evidence of recurrence today - Recommend regular full body skin exams - Recommend daily broad spectrum sunscreen SPF 30+ to sun-exposed areas, reapply every 2 hours as needed.  - Call if any new or changing lesions are noted between office visits  History of Dysplastic Nevi - No evidence of recurrence today - Recommend regular full body skin exams - Recommend daily broad spectrum sunscreen SPF 30+ to sun-exposed areas, reapply every 2 hours as needed.  - Call if any new or changing lesions are noted between office visits  Lentigines - Scattered tan macules - Due to sun exposure - Benign-appearing,  observe - Recommend daily broad spectrum sunscreen SPF 30+ to sun-exposed areas, reapply every 2 hours as needed. - Call for any changes  Seborrheic Keratoses - Stuck-on, waxy, tan-brown papules and/or plaques  - Benign-appearing - Discussed benign etiology and prognosis. - Observe - Call for any changes  Melanocytic Nevi - Tan-brown and/or pink-flesh-colored symmetric macules and papules - Benign appearing on exam today - Observation - Call clinic for new or changing moles - Recommend daily use of broad spectrum spf 30+ sunscreen to sun-exposed areas.   Hemangiomas - Red papules - Discussed benign nature - Observe - Call for any changes  Actinic Damage - Chronic condition, secondary to cumulative UV/sun exposure - diffuse scaly erythematous macules with underlying dyspigmentation - Recommend daily broad spectrum sunscreen SPF 30+ to sun-exposed areas, reapply every 2 hours as needed.  - Staying in the shade or wearing long sleeves, sun glasses (UVA+UVB protection) and wide brim hats (4-inch brim around the entire circumference of the hat) are also recommended for sun protection.  - Call for new or changing lesions.  Skin cancer screening performed today.  AK (actinic keratosis) (8) Nose, scalp x 7 Destruction of lesion - Nose, scalp x 7 Complexity: simple   Destruction method: cryotherapy   Informed consent: discussed and consent obtained   Timeout:  patient name, date of birth, surgical site, and procedure verified Lesion destroyed using liquid nitrogen: Yes   Region frozen until ice ball extended beyond lesion: Yes   Outcome: patient tolerated procedure well with no complications  Post-procedure details: wound care instructions given    Metastatic Melanoma Left lat infraclavicular History of Melanoma - Left lateral infraclavicular in February 2015; S/P Wide Local excision - clear with close follow up for past 7 years.  Patient met all recommended treatment protocol  and follow up. Metastatic Melanoma of left axilla diagnosed 2022 - he is being treated with [name] (he has 2 treatments left). Skin is Clear today.  No lymphadenopathy.  Observe for recurrence. Call clinic for new or changing lesions.  Recommend regular skin exams, daily broad-spectrum spf 30+ sunscreen use, and photoprotection.   Follow-up with oncology.  Return in about 4 months (around 01/18/2023) for TBSE.  I, Ashok Cordia, CMA, am acting as scribe for Sarina Ser, MD . Documentation: I have reviewed the above documentation for accuracy and completeness, and I agree with the above.  Sarina Ser, MD

## 2022-09-23 ENCOUNTER — Inpatient Hospital Stay: Payer: Medicare HMO

## 2022-09-23 ENCOUNTER — Inpatient Hospital Stay (HOSPITAL_BASED_OUTPATIENT_CLINIC_OR_DEPARTMENT_OTHER): Payer: Medicare HMO | Admitting: Internal Medicine

## 2022-09-23 ENCOUNTER — Encounter: Payer: Self-pay | Admitting: Internal Medicine

## 2022-09-23 ENCOUNTER — Inpatient Hospital Stay: Payer: Medicare HMO | Attending: Oncology

## 2022-09-23 VITALS — BP 128/94 | HR 67 | Temp 96.9°F | Resp 16 | Ht 73.0 in | Wt 191.0 lb

## 2022-09-23 DIAGNOSIS — C779 Secondary and unspecified malignant neoplasm of lymph node, unspecified: Secondary | ICD-10-CM

## 2022-09-23 DIAGNOSIS — E039 Hypothyroidism, unspecified: Secondary | ICD-10-CM

## 2022-09-23 DIAGNOSIS — Z5112 Encounter for antineoplastic immunotherapy: Secondary | ICD-10-CM

## 2022-09-23 DIAGNOSIS — E2740 Unspecified adrenocortical insufficiency: Secondary | ICD-10-CM | POA: Diagnosis not present

## 2022-09-23 DIAGNOSIS — E032 Hypothyroidism due to medicaments and other exogenous substances: Secondary | ICD-10-CM | POA: Insufficient documentation

## 2022-09-23 DIAGNOSIS — Z79890 Hormone replacement therapy (postmenopausal): Secondary | ICD-10-CM | POA: Insufficient documentation

## 2022-09-23 DIAGNOSIS — C773 Secondary and unspecified malignant neoplasm of axilla and upper limb lymph nodes: Secondary | ICD-10-CM | POA: Insufficient documentation

## 2022-09-23 DIAGNOSIS — C4359 Malignant melanoma of other part of trunk: Secondary | ICD-10-CM | POA: Insufficient documentation

## 2022-09-23 LAB — COMPREHENSIVE METABOLIC PANEL
ALT: 18 U/L (ref 0–44)
AST: 20 U/L (ref 15–41)
Albumin: 3.8 g/dL (ref 3.5–5.0)
Alkaline Phosphatase: 52 U/L (ref 38–126)
Anion gap: 9 (ref 5–15)
BUN: 15 mg/dL (ref 8–23)
CO2: 24 mmol/L (ref 22–32)
Calcium: 8.7 mg/dL — ABNORMAL LOW (ref 8.9–10.3)
Chloride: 106 mmol/L (ref 98–111)
Creatinine, Ser: 1.09 mg/dL (ref 0.61–1.24)
GFR, Estimated: >60 mL/min (ref >60)
Glucose, Bld: 97 mg/dL (ref 70–99)
Potassium: 3.8 mmol/L (ref 3.5–5.1)
Sodium: 139 mmol/L (ref 135–145)
Total Bilirubin: 0.6 mg/dL (ref 0.3–1.2)
Total Protein: 6 g/dL — ABNORMAL LOW (ref 6.5–8.1)

## 2022-09-23 LAB — CBC WITH DIFFERENTIAL/PLATELET
Abs Immature Granulocytes: 0.03 10*3/uL (ref 0.00–0.07)
Basophils Absolute: 0.1 10*3/uL (ref 0.0–0.1)
Basophils Relative: 1 %
Eosinophils Absolute: 0.2 10*3/uL (ref 0.0–0.5)
Eosinophils Relative: 3 %
HCT: 42.7 % (ref 39.0–52.0)
Hemoglobin: 14.3 g/dL (ref 13.0–17.0)
Immature Granulocytes: 1 %
Lymphocytes Relative: 25 %
Lymphs Abs: 1.6 10*3/uL (ref 0.7–4.0)
MCH: 30 pg (ref 26.0–34.0)
MCHC: 33.5 g/dL (ref 30.0–36.0)
MCV: 89.7 fL (ref 80.0–100.0)
Monocytes Absolute: 0.4 10*3/uL (ref 0.1–1.0)
Monocytes Relative: 6 %
Neutro Abs: 4.1 10*3/uL (ref 1.7–7.7)
Neutrophils Relative %: 64 %
Platelets: 260 10*3/uL (ref 150–400)
RBC: 4.76 MIL/uL (ref 4.22–5.81)
RDW: 13.8 % (ref 11.5–15.5)
WBC: 6.4 10*3/uL (ref 4.0–10.5)
nRBC: 0 % (ref 0.0–0.2)

## 2022-09-23 MED ORDER — SODIUM CHLORIDE 0.9 % IV SOLN
Freq: Once | INTRAVENOUS | Status: AC
  Filled 2022-09-23: qty 250

## 2022-09-23 MED ORDER — SODIUM CHLORIDE 0.9 % IV SOLN
200.0000 mg | Freq: Once | INTRAVENOUS | Status: AC
  Administered 2022-09-23: 200 mg via INTRAVENOUS
  Filled 2022-09-23: qty 8

## 2022-09-23 NOTE — Progress Notes (Signed)
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   There is sufficient material for ancillary molecular testing if needed  (block A3, A1, A2).   Comment:  Per provided outside pathology report the patient had an "at least pT1b"  malignant melanoma of the left lateral infraclavicular skin in 2015.  Touch preparations demonstrate predominantly discohesive large  pleomorphic cells, some with binucleation.  HE sections demonstrate  lymph node parenchyma involved by metastatic neoplasm.  The neoplastic  cells are pleomorphic with binucleation, focal intranuclear inclusions,  and associated tumor necrosis. The neoplastic cells demonstrate the  following pattern of immunoreactivity:  S100: Positive  SOX10: Positive  HMB-45: Negative  Melan-A: Negative  Super pancytokeratin: Negative  CD45: Negative  This pattern of immunoreactivity supports the above diagnosis.   # NOV 7th, 2022-neoadjuvant Keytruda cycle #1  # JAN, 26th, [name],  majority of these foci measure 1-2 mm in greatest dimension, with the largest measuring 4 mm.  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  Accompanied by his wife.   [name] 66 y.o.  male pleasant patient above history of recurrent melanoma left axillary lymphadenopathy-s/p surgery is currently on adjuvant Keytruda-with iatrogenic hypothyroidism /adrenal insufficiency is here for follow-up.   Patient seen today prior to cycle 17 out of 18 of [place] He was recently diagnosed with COVID infection.  Completed Paxlovid.  Feeling better today.  His treatment was delayed by 2 weeks due to infection. Patient reported mild intermittent discomfort in the left axilla started 3 weeks ago.  Had a melanoma surgery in that area.  It is not bothering him.  Review of Systems  Constitutional:  Negative for chills, diaphoresis, fever, malaise/fatigue and weight loss.  HENT:  Negative for nosebleeds and sore throat.   Eyes:  Negative for double vision.  Respiratory:  Negative for hemoptysis, sputum production, shortness of breath and wheezing.   Cardiovascular:  Negative for chest pain, palpitations, orthopnea and leg swelling.  Gastrointestinal:  Negative for abdominal pain, blood in stool, constipation, diarrhea, heartburn, melena, nausea and vomiting.  Genitourinary:  Negative for dysuria, frequency and urgency.  Musculoskeletal:  Positive for myalgias. Negative for back pain and joint pain.  Skin:  Negative.  Negative for itching and rash.  Neurological:  Positive for tingling. Negative for dizziness, focal weakness and weakness.  Endo/Heme/Allergies:  Does not bruise/bleed easily.  Psychiatric/Behavioral:  Negative for depression. The patient is not nervous/anxious and does not have insomnia.       PAST MEDICAL HISTORY :  Past Medical History:  Diagnosis Date   Basal cell carcinoma 03/10/2013   Right medial forehead. Excised, margins free.   Dysplastic nevus 11/05/2006   Mid back paraspinal, [name], close to edge. Excised 12/24/2006, margins free.   Dysplastic nevus 12/15/2008   Left lat. pectoral area. Moderate atypia, extends to one edge.    Dysplastic nevus 12/07/2009   Left mid side. Moderate atypia, close to margin.   Dysplastic nevus 12/07/2009   Right posterior waistline. Moderate atypia, close to margin.   Dysplastic nevus 02/10/2014   Left paraspinal mid back. Moderate atypia, lateral and deep margin involved.    Dysplastic nevus 06/16/2014   Left epigastric. Mild atypia, deep margin involved.    GERD (gastroesophageal reflux disease)    Hx of basal cell carcinoma    multiple sites   Hx of malignant melanoma 11/11/2013   L lateral infraclavicular, superficial spreading, Breslow's 0.4m, Clark's level III   Hypothyroidism    [place] ([facility] 11/[place]/2013   Left lateral infraclavicular. MM, superficial spreading. Anatomic level III. Tumor thickness 0.649m Excised 11/24/2013, margins free.    [place]   L axilla, excised by Dr. ByFleet Contrasfollowd by Dr. BrBurlene Arntith KeBeryle Flock  PAST SURGICAL HISTORY :   Past Surgical History:  Procedure Laterality Date   AXILLARY LYMPH NODE DISSECTION Left 11/02/2021   Procedure: AXILLARY LYMPH NODE DISSECTION;  [name];  [name];  Service: General;  Laterality: Left;   COLONOSCOPY WITH PROPOFOL N/A 01/20/2017   Procedure: COLONOSCOPY  WITH PROPOFOL;  [name];  [facility];  Service: Endoscopy;  [name];   ESOPHAGOGASTRODUODENOSCOPY (EGD) WITH ESOPHAGEAL DILATION     MELANOMA EXCISION Right 11/24/2013   lateral infraclavicular   WISDOM TOOTH EXTRACTION     WRIST SURGERY Left 2022    FAMILY HISTORY :   Family History  Problem Relation Age of Onset   Macular degeneration Mother    Hyperlipidemia Mother    Heart disease Father    Heart attack Father    Lung cancer Father        metastasized    SOCIAL HISTORY:   Social History   Tobacco Use   Smoking status: Never   Smokeless tobacco: Never  Vaping Use   Vaping Use: Never used  Substance Use Topics   Alcohol use: Yes    Comment: about 1-3 glass of wine with dinner   Drug use: No    ALLERGIES:  has No Known Allergies.  MEDICATIONS:  Current Outpatient Medications  Medication Sig Dispense Refill   aspirin 81 MG tablet Take 81 mg by mouth daily.     benzonatate (TESSALON) 200 MG capsule Take 1 capsule (200 mg total) by mouth 3 (three) times daily as needed for cough. 20 capsule 0   hydrocortisone (CORTEF) 10 MG tablet TAKE 2 PILLS IN AM 1 PILL AT NIGHT. DO NOT STOP UNTIL DIRECTED. TAKE WITH FOOD. 90 tablet 1   levothyroxine (SYNTHROID) 125 MCG tablet  Take 1 tablet (125 mcg total) by mouth daily before breakfast. 90 tablet 3   Multiple Vitamins tablet Take 1 tablet by mouth daily.     Multiple Vitamins-Minerals (PRESERVISION AREDS 2 PO) Take 1 capsule by mouth in the morning and at bedtime.     pantoprazole (PROTONIX) 20 MG tablet Take 20 mg by mouth daily.  0   Pembrolizumab (KEYTRUDA IV) Inject 1 Dose into the vein every 21 ( twenty-one) days.     No current facility-administered medications for this visit.    PHYSICAL EXAMINATION: ECOG PERFORMANCE STATUS: 0 - Asymptomatic  There were no vitals taken for this visit.  There were no vitals filed for this visit.  Physical Exam Vitals and nursing note  reviewed.  HENT:     Head: Normocephalic and atraumatic.     Mouth/Throat:     Pharynx: Oropharynx is clear.  Eyes:     Extraocular Movements: Extraocular movements intact.     Pupils: Pupils are equal, round, and reactive to light.  Cardiovascular:     Rate and Rhythm: Normal rate and regular rhythm.  Pulmonary:     Comments: Decreased breath sounds bilaterally.  Abdominal:     Palpations: Abdomen is soft.  Musculoskeletal:        General: Normal range of motion.     Cervical back: Normal range of motion.  Skin:    General: Skin is warm.  Neurological:     General: No focal deficit present.     Mental Status: He is alert and oriented to person, place, and time.  Psychiatric:        Behavior: Behavior normal.        Judgment: Judgment normal.        LABORATORY DATA:  I have reviewed the data as listed    Component Value Date/Time   NA 139 09/23/2022 1238   NA 144 05/16/2022 0953   K 3.8 09/23/2022 1238   CL 106 09/23/2022 1238   CO2 24 09/23/2022 1238   GLUCOSE 97 09/23/2022 1238   BUN 15 09/23/2022 1238   BUN 12 05/16/2022 0953   CREATININE 1.09 09/23/2022 1238   CALCIUM 8.7 (L) 09/23/2022 1238   PROT 6.0 (L) 09/23/2022 1238   PROT 5.7 (L) 05/16/2022 0953   ALBUMIN 3.8 09/23/2022 1238   ALBUMIN 3.9 05/16/2022 0953   AST 20 09/23/2022 1238   ALT 18 09/23/2022 1238   ALKPHOS 52 09/23/2022 1238   BILITOT 0.6 09/23/2022 1238   BILITOT 0.5 05/16/2022 0953   GFRNONAA >60 09/23/2022 1238   GFRAA 75 05/10/2020 1017    No results found for: "SPEP", "UPEP"  Lab Results  Component Value Date   WBC 6.4 09/23/2022   NEUTROABS 4.1 09/23/2022   HGB 14.3 09/23/2022   HCT 42.7 09/23/2022   MCV 89.7 09/23/2022   PLT 260 09/23/2022      Chemistry      Component Value Date/Time   NA 139 09/23/2022 1238   NA 144 05/16/2022 0953   K 3.8 09/23/2022 1238   CL 106 09/23/2022 1238   CO2 24 09/23/2022 1238   BUN 15 09/23/2022 1238   BUN 12 05/16/2022 0953    CREATININE 1.09 09/23/2022 1238   GLU 96 10/19/2014 0000      Component Value Date/Time   CALCIUM 8.7 (L) 09/23/2022 1238   ALKPHOS 52 09/23/2022 1238   AST 20 09/23/2022 1238   ALT 18 09/23/2022 1238   BILITOT 0.6 09/23/2022 1238  BILITOT 0.5 05/16/2022 0953       RADIOGRAPHIC STUDIES: I have personally reviewed the radiological images as listed and agreed with the findings in the report. No results found.   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  S/p neoadjuvant Keytruda x4- -Status post axillary lymph node dissection-with residual micrometastatic disease.  Patient currently on adjuvant Keytruda.  Tolerating with moderate-severe difficulties see below  # proceed with  [place] #17 today of planned total 18. Labs today reviewed. [place] with treatment.  # covid infection -completed Paxlovid.  Recovered.    #Endocrinopathies: Hypothyroidism/adrenal insufficiency: [s/p evaluation- SEP 2023- Endocrinology-] iatrogenic hypothyroidism [AUG 2023; TSH- 11]-on Synthroid 150 mcg once a day; SEP 2023- TSH 0.15; Normal T3/T4-. Monitor for now.  Adrenal insufficiency -extreme fatigue--currently on hydrocortsione 20 mg in the morning and 10 mg at night.     # DISPOSITION:   # Keytruda today # follow up in 3 weeks- MD; labs- cbc/cmp/thyroid panel;Keytruda-   Dr.B.   All questions were answered. The patient knows to call the clinic with any problems, questions or concerns.      Jane Canary, MD 09/23/2022 1:13 PM

## 2022-09-23 NOTE — Patient Instructions (Signed)
[facility] [place]  Discharge Instructions: Thank you for choosing [facility] to provide your oncology and hematology care.  If you have a lab appointment with the Cancer Center, please go directly to the Cancer Center and check in at the registration area.  Wear comfortable clothing and clothing appropriate for easy access to any Portacath or PICC line.   We strive to give you quality time with your provider. You may need to reschedule your appointment if you arrive late (15 or more minutes).  Arriving late affects you and other patients whose appointments are after yours.  Also, if you miss three or more appointments without notifying the office, you may be dismissed from the clinic at the provider's discretion.      For prescription refill requests, have your pharmacy contact our office and allow 72 hours for refills to be completed.    Today you received the following chemotherapy and/or immunotherapy agents KEYTRUDA      To help prevent nausea and vomiting after your treatment, we encourage you to take your nausea medication as directed.  BELOW ARE SYMPTOMS THAT SHOULD BE REPORTED IMMEDIATELY: *FEVER GREATER THAN 100.4 F (38 C) OR HIGHER *CHILLS OR SWEATING *NAUSEA AND VOMITING THAT IS NOT CONTROLLED WITH YOUR NAUSEA MEDICATION *UNUSUAL SHORTNESS OF BREATH *UNUSUAL BRUISING OR BLEEDING *URINARY PROBLEMS (pain or burning when urinating, or frequent urination) *BOWEL PROBLEMS (unusual diarrhea, constipation, pain near the anus) TENDERNESS IN MOUTH AND THROAT WITH OR WITHOUT PRESENCE OF ULCERS (sore throat, sores in mouth, or a toothache) UNUSUAL RASH, SWELLING OR PAIN  UNUSUAL VAGINAL DISCHARGE OR ITCHING   Items with * indicate a potential emergency and should be followed up as soon as possible or go to the Emergency Department if any problems should occur.  Please show the CHEMOTHERAPY ALERT CARD or IMMUNOTHERAPY ALERT CARD at check-in to  the Emergency Department and triage nurse.  Should you have questions after your visit or need to cancel or reschedule your appointment, please contact [facility]  [phone] and follow the prompts.  Office hours are 8:00 a.m. to 4:30 p.m. Monday - Friday. Please note that voicemails left after 4:00 p.m. may not be returned until the following business day.  We are closed weekends and major holidays. You have access to a nurse at all times for urgent questions. Please call the main number to the clinic [phone] and follow the prompts.  For any non-urgent questions, you may also contact your provider using MyChart. We now offer e-Visits for anyone 18 and older to request care online for non-urgent symptoms. For details visit [email or web address].   Also download the MyChart app! Go to the app store, search "MyChart", open the app, select [facility], and log in with your MyChart username and password.  Masks are optional in the cancer centers. If you would like for your care team to wear a mask while they are taking care of you, please let them know. For doctor visits, patients may have with them one support person who is at least 68 years old. At this time, visitors are not allowed in the infusion area. Pembrolizumab Injection What is this medication? PEMBROLIZUMAB (PEM broe LIZ ue mab) treats some types of cancer. It works by helping your immune system slow or stop the spread of cancer cells. It is a monoclonal antibody. This medicine may be used for other purposes; ask your health care provider or pharmacist if you have questions. COMMON   BRAND NAME(S): Keytruda What should I tell my care team before I take this medication? They need to know if you have any of these conditions: Allogeneic stem cell transplant (uses someone else's stem cells) Autoimmune diseases, such as Crohn disease, ulcerative colitis, lupus History of chest radiation Nervous system  problems, such as Guillain-Barre syndrome, myasthenia gravis Organ transplant An unusual or allergic reaction to pembrolizumab, other medications, foods, dyes, or preservatives Pregnant or trying to get pregnant Breast-feeding How should I use this medication? This medication is injected into a vein. It is given by your care team in a hospital or clinic setting. A special MedGuide will be given to you before each treatment. Be sure to read this information carefully each time. Talk to your care team about the use of this medication in children. While it may be prescribed for children as young as 6 months for selected conditions, precautions do apply. Overdosage: If you think you have taken too much of this medicine contact a poison control center or emergency room at once. NOTE: This medicine is only for you. Do not share this medicine with others. What if I miss a dose? Keep appointments for follow-up doses. It is important not to miss your dose. Call your care team if you are unable to keep an appointment. What may interact with this medication? Interactions have not been studied. This list may not describe all possible interactions. Give your health care provider a list of all the medicines, herbs, non-prescription drugs, or dietary supplements you use. Also tell them if you smoke, drink alcohol, or use illegal drugs. Some items may interact with your medicine. What should I watch for while using this medication? Your condition will be monitored carefully while you are receiving this medication. You may need blood work while taking this medication. This medication may cause serious skin reactions. They can happen weeks to months after starting the medication. Contact your care team right away if you notice fevers or flu-like symptoms with a rash. The rash may be red or purple and then turn into blisters or peeling of the skin. You may also notice a red rash with swelling of the face, lips, or  lymph nodes in your neck or under your arms. Tell your care team right away if you have any change in your eyesight. Talk to your care team if you may be pregnant. Serious birth defects can occur if you take this medication during pregnancy and for 4 months after the last dose. You will need a negative pregnancy test before starting this medication. Contraception is recommended while taking this medication and for 4 months after the last dose. Your care team can help you find the option that works for you. Do not breastfeed while taking this medication and for 4 months after the last dose. What side effects may I notice from receiving this medication? Side effects that you should report to your care team as soon as possible: Allergic reactions--skin rash, itching, hives, swelling of the face, lips, tongue, or throat Dry cough, shortness of breath or trouble breathing Eye pain, redness, irritation, or discharge with blurry or decreased vision Heart muscle inflammation--unusual weakness or fatigue, shortness of breath, chest pain, fast or irregular heartbeat, dizziness, swelling of the ankles, feet, or hands Hormone gland problems--headache, sensitivity to light, unusual weakness or fatigue, dizziness, fast or irregular heartbeat, increased sensitivity to cold or heat, excessive sweating, constipation, hair loss, increased thirst or amount of urine, tremors or shaking, irritability Infusion reactions--chest   pain, shortness of breath or trouble breathing, feeling faint or lightheaded Kidney injury (glomerulonephritis)--decrease in the amount of urine, red or dark brown urine, foamy or bubbly urine, swelling of the ankles, hands, or feet Liver injury--right upper belly pain, loss of appetite, nausea, light-colored stool, dark yellow or brown urine, yellowing skin or eyes, unusual weakness or fatigue Pain, tingling, or numbness in the hands or feet, muscle weakness, change in vision, confusion or trouble  speaking, loss of balance or coordination, trouble walking, seizures Rash, fever, and swollen lymph nodes Redness, blistering, peeling, or loosening of the skin, including inside the mouth Sudden or severe stomach pain, bloody diarrhea, fever, nausea, vomiting Side effects that usually do not require medical attention (report to your care team if they continue or are bothersome): Bone, joint, or muscle pain Diarrhea Fatigue Loss of appetite Nausea Skin rash This list may not describe all possible side effects. Call your doctor for medical advice about side effects. You may report side effects to FDA at 1-800-FDA-1088. Where should I keep my medication? This medication is given in a hospital or clinic. It will not be stored at home. NOTE: This sheet is a summary. It may not cover all possible information. If you have questions about this medicine, talk to your doctor, pharmacist, or health care provider.  2023 Elsevier/Gold Standard (2013-06-14 00:00:00)    

## 2022-10-03 ENCOUNTER — Encounter: Payer: Self-pay | Admitting: Dermatology

## 2022-10-09 DIAGNOSIS — M5033 Other cervical disc degeneration, cervicothoracic region: Secondary | ICD-10-CM | POA: Diagnosis not present

## 2022-10-09 DIAGNOSIS — M9901 Segmental and somatic dysfunction of cervical region: Secondary | ICD-10-CM | POA: Diagnosis not present

## 2022-10-09 DIAGNOSIS — M62830 Muscle spasm of back: Secondary | ICD-10-CM | POA: Diagnosis not present

## 2022-10-09 DIAGNOSIS — M9903 Segmental and somatic dysfunction of lumbar region: Secondary | ICD-10-CM | POA: Diagnosis not present

## 2022-10-14 ENCOUNTER — Inpatient Hospital Stay: Payer: Medicare HMO | Attending: Oncology

## 2022-10-14 ENCOUNTER — Inpatient Hospital Stay (HOSPITAL_BASED_OUTPATIENT_CLINIC_OR_DEPARTMENT_OTHER): Payer: Medicare HMO | Admitting: Internal Medicine

## 2022-10-14 ENCOUNTER — Inpatient Hospital Stay: Payer: Medicare HMO

## 2022-10-14 DIAGNOSIS — C779 Secondary and unspecified malignant neoplasm of lymph node, unspecified: Secondary | ICD-10-CM

## 2022-10-14 DIAGNOSIS — C4359 Malignant melanoma of other part of trunk: Secondary | ICD-10-CM | POA: Insufficient documentation

## 2022-10-14 DIAGNOSIS — C773 Secondary and unspecified malignant neoplasm of axilla and upper limb lymph nodes: Secondary | ICD-10-CM | POA: Insufficient documentation

## 2022-10-14 DIAGNOSIS — Z79899 Other long term (current) drug therapy: Secondary | ICD-10-CM | POA: Insufficient documentation

## 2022-10-14 DIAGNOSIS — Z5112 Encounter for antineoplastic immunotherapy: Secondary | ICD-10-CM | POA: Diagnosis not present

## 2022-10-14 LAB — CBC WITH DIFFERENTIAL/PLATELET
Abs Immature Granulocytes: 0.06 10*3/uL (ref 0.00–0.07)
Basophils Absolute: 0 10*3/uL (ref 0.0–0.1)
Basophils Relative: 0 %
Eosinophils Absolute: 0.2 10*3/uL (ref 0.0–0.5)
Eosinophils Relative: 2 %
HCT: 42.9 % (ref 39.0–52.0)
Hemoglobin: 14.6 g/dL (ref 13.0–17.0)
Immature Granulocytes: 1 %
Lymphocytes Relative: 13 %
Lymphs Abs: 1.3 10*3/uL (ref 0.7–4.0)
MCH: 30.4 pg (ref 26.0–34.0)
MCHC: 34 g/dL (ref 30.0–36.0)
MCV: 89.4 fL (ref 80.0–100.0)
Monocytes Absolute: 0.6 10*3/uL (ref 0.1–1.0)
Monocytes Relative: 6 %
Neutro Abs: 7.8 10*3/uL — ABNORMAL HIGH (ref 1.7–7.7)
Neutrophils Relative %: 78 %
Platelets: 239 10*3/uL (ref 150–400)
RBC: 4.8 MIL/uL (ref 4.22–5.81)
RDW: 14.4 % (ref 11.5–15.5)
WBC: 10 10*3/uL (ref 4.0–10.5)
nRBC: 0 % (ref 0.0–0.2)

## 2022-10-14 LAB — COMPREHENSIVE METABOLIC PANEL
ALT: 18 U/L (ref 0–44)
AST: 18 U/L (ref 15–41)
Albumin: 3.7 g/dL (ref 3.5–5.0)
Alkaline Phosphatase: 57 U/L (ref 38–126)
Anion gap: 9 (ref 5–15)
BUN: 17 mg/dL (ref 8–23)
CO2: 22 mmol/L (ref 22–32)
Calcium: 8.7 mg/dL — ABNORMAL LOW (ref 8.9–10.3)
Chloride: 106 mmol/L (ref 98–111)
Creatinine, Ser: 1.11 mg/dL (ref 0.61–1.24)
GFR, Estimated: >60 mL/min (ref >60)
Glucose, Bld: 104 mg/dL — ABNORMAL HIGH (ref 70–99)
Potassium: 3.6 mmol/L (ref 3.5–5.1)
Sodium: 137 mmol/L (ref 135–145)
Total Bilirubin: 0.8 mg/dL (ref 0.3–1.2)
Total Protein: 5.8 g/dL — ABNORMAL LOW (ref 6.5–8.1)

## 2022-10-14 LAB — TSH: TSH: 0.556 u[IU]/mL (ref 0.350–4.500)

## 2022-10-14 MED ORDER — SODIUM CHLORIDE 0.9 % IV SOLN
Freq: Once | INTRAVENOUS | Status: AC
  Filled 2022-10-14: qty 250

## 2022-10-14 MED ORDER — SODIUM CHLORIDE 0.9 % IV SOLN
200.0000 mg | Freq: Once | INTRAVENOUS | Status: AC
  Administered 2022-10-14: 200 mg via INTRAVENOUS
  Filled 2022-10-14: qty 200

## 2022-10-14 NOTE — Patient Instructions (Signed)
[facility]  Discharge Instructions: Thank you for choosing [facility] to provide your oncology and hematology care.  If you have a lab appointment with the Cancer Center, please go directly to the Cancer Center and check in at the registration area.  Wear comfortable clothing and clothing appropriate for easy access to any Portacath or PICC line.   We strive to give you quality time with your provider. You may need to reschedule your appointment if you arrive late (15 or more minutes).  Arriving late affects you and other patients whose appointments are after yours.  Also, if you miss three or more appointments without notifying the office, you may be dismissed from the clinic at the provider's discretion.      For prescription refill requests, have your pharmacy contact our office and allow 72 hours for refills to be completed.    Today you received the following chemotherapy and/or immunotherapy agents Keytruda       To help prevent nausea and vomiting after your treatment, we encourage you to take your nausea medication as directed.  BELOW ARE SYMPTOMS THAT SHOULD BE REPORTED IMMEDIATELY: *FEVER GREATER THAN 100.4 F (38 C) OR HIGHER *CHILLS OR SWEATING *NAUSEA AND VOMITING THAT IS NOT CONTROLLED WITH YOUR NAUSEA MEDICATION *UNUSUAL SHORTNESS OF BREATH *UNUSUAL BRUISING OR BLEEDING *URINARY PROBLEMS (pain or burning when urinating, or frequent urination) *BOWEL PROBLEMS (unusual diarrhea, constipation, pain near the anus) TENDERNESS IN MOUTH AND THROAT WITH OR WITHOUT PRESENCE OF ULCERS (sore throat, sores in mouth, or a toothache) UNUSUAL RASH, SWELLING OR PAIN  UNUSUAL VAGINAL DISCHARGE OR ITCHING   Items with * indicate a potential emergency and should be followed up as soon as possible or go to the Emergency Department if any problems should occur.  Please show the CHEMOTHERAPY ALERT CARD or IMMUNOTHERAPY ALERT CARD at check-in to  the Emergency Department and triage nurse.  Should you have questions after your visit or need to cancel or reschedule your appointment, please contact [facility]  [phone] and follow the prompts.  Office hours are 8:00 a.m. to 4:30 p.m. Monday - Friday. Please note that voicemails left after 4:00 p.m. may not be returned until the following business day.  We are closed weekends and major holidays. You have access to a nurse at all times for urgent questions. Please call the main number to the clinic [phone] and follow the prompts.  For any non-urgent questions, you may also contact your provider using MyChart. We now offer e-Visits for anyone 18 and older to request care online for non-urgent symptoms. For details visit [email or web address].   Also download the MyChart app! Go to the app store, search "MyChart", open the app, select [facility], and log in with your MyChart username and password.    

## 2022-10-14 NOTE — Progress Notes (Signed)
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   There is sufficient material for ancillary molecular testing if needed  (block A3, A1, A2).   Comment:  Per provided outside pathology report the patient had an "at least pT1b"  malignant melanoma of the left lateral infraclavicular skin in 2015.  Touch preparations demonstrate predominantly discohesive large  pleomorphic cells, some with binucleation.  HE sections demonstrate  lymph node parenchyma involved by metastatic neoplasm.  The neoplastic  cells are pleomorphic with binucleation, focal intranuclear inclusions,  and associated tumor necrosis. The neoplastic cells demonstrate the  following pattern of immunoreactivity:  S100: Positive  SOX10: Positive  HMB-45: Negative  Melan-A: Negative  Super pancytokeratin: Negative  CD45: Negative  This pattern of immunoreactivity supports the above diagnosis.   # NOV 7th, 2022-neoadjuvant Keytruda cycle #1  # JAN, 26th, [name],  majority of these foci measure 1-2 mm in greatest dimension, with the largest measuring 4 mm.  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  Accompanied by his wife.   MALACKI MCPHEARSON 67 y.o.  male pleasant patient above history of recurrent melanoma left axillary lymphadenopathy-s/p surgery is currently on adjuvant Keytruda-with iatrogenic hypothyroidism /adrenal insufficiency is here for follow-up.   Patient states he has a little head congestion going on at the moment.  Patient otherwise denies any shortness of breath or cough.  No fever no chills.  No skin rash.  Review of Systems  Constitutional:  Negative for chills, diaphoresis, fever, malaise/fatigue and weight loss.  HENT:  Negative for nosebleeds and sore throat.   Eyes:  Negative for double vision.  Respiratory:  Negative for hemoptysis, sputum production, shortness of breath and wheezing.   Cardiovascular:  Negative for chest pain, palpitations, orthopnea and leg swelling.  Gastrointestinal:  Negative for abdominal pain, blood in stool, constipation, diarrhea, heartburn, melena, nausea and vomiting.  Genitourinary:  Negative for dysuria, frequency and urgency.  Musculoskeletal:  Positive for myalgias. Negative for back pain and joint pain.  Skin: Negative.  Negative for itching and rash.  Neurological:  Positive for  tingling. Negative for dizziness, focal weakness and weakness.  Endo/Heme/Allergies:  Does not bruise/bleed easily.   Psychiatric/Behavioral:  Negative for depression. The patient is not nervous/anxious and does not have insomnia.       PAST MEDICAL HISTORY :  Past Medical History:  Diagnosis Date   Basal cell carcinoma 03/10/2013   Right medial forehead. Excised, margins free.   Dysplastic nevus 11/05/2006   Mid back paraspinal, [name], close to edge. Excised 12/24/2006, margins free.   Dysplastic nevus 12/15/2008   Left lat. pectoral area. Moderate atypia, extends to one edge.    Dysplastic nevus 12/07/2009   Left mid side. Moderate atypia, close to margin.   Dysplastic nevus 12/07/2009   Right posterior waistline. Moderate atypia, close to margin.   Dysplastic nevus 02/10/2014   Left paraspinal mid back. Moderate atypia, lateral and deep margin involved.    Dysplastic nevus 06/16/2014   Left epigastric. Mild atypia, deep margin involved.    GERD (gastroesophageal reflux disease)    Hx of basal cell carcinoma    multiple sites   Hx of malignant melanoma 11/11/2013   L lateral infraclavicular, superficial spreading, Breslow's 0.20m, Clark's level III   Hypothyroidism    Melanoma ([facility] 11/11/2013   Left lateral infraclavicular. MM, superficial spreading. Anatomic level III. Tumor thickness 0.639m Excised 11/24/2013, margins free.    Metastatic melanoma ([facility]   L axilla, excised by Dr. ByFleet Contrasfollowd by Dr. BrBurlene Arntith KeBeryle Flock  PAST SURGICAL HISTORY :   Past Surgical History:  Procedure Laterality Date   AXILLARY LYMPH NODE DISSECTION Left 11/02/2021   Procedure: AXILLARY LYMPH NODE DISSECTION;  [name];  [name];  Service: General;  Laterality: Left;   COLONOSCOPY WITH PROPOFOL N/A 01/20/2017   Procedure: COLONOSCOPY WITH PROPOFOL;  [name];  [facility];  Service: Endoscopy;  [name];   ESOPHAGOGASTRODUODENOSCOPY (EGD) WITH ESOPHAGEAL DILATION     MELANOMA  EXCISION Right 11/24/2013   lateral infraclavicular   WISDOM TOOTH EXTRACTION     WRIST SURGERY Left 2022    FAMILY HISTORY :   Family History  Problem Relation Age of Onset   Macular degeneration Mother    Hyperlipidemia Mother    Heart disease Father    Heart attack Father    Lung cancer Father        metastasized    SOCIAL HISTORY:   Social History   Tobacco Use   Smoking status: Never   Smokeless tobacco: Never  Vaping Use   Vaping Use: Never used  Substance Use Topics   Alcohol use: Yes    Comment: about 1-3 glass of wine with dinner   Drug use: No    ALLERGIES:  has No Known Allergies.  MEDICATIONS:  Current Outpatient Medications  Medication Sig Dispense Refill   aspirin 81 MG tablet Take 81 mg by mouth daily.     hydrocortisone (CORTEF) 10 MG tablet TAKE 2 PILLS IN AM 1 PILL AT NIGHT. DO NOT STOP UNTIL DIRECTED. TAKE WITH FOOD. 90 tablet 1   levothyroxine (SYNTHROID) 125 MCG tablet Take 1 tablet (125 mcg total) by mouth daily before breakfast. 90 tablet 3   Multiple Vitamins tablet Take 1 tablet by mouth daily.     Multiple Vitamins-Minerals (PRESERVISION AREDS 2 PO) Take 1 capsule by mouth in the morning and at bedtime.     pantoprazole (PROTONIX) 20 MG tablet Take 20 mg by  mouth daily.  0   Pembrolizumab (KEYTRUDA IV) Inject 1 Dose into the vein every 21 ( twenty-one) days.     No current facility-administered medications for this visit.    PHYSICAL EXAMINATION: ECOG PERFORMANCE STATUS: 0 - Asymptomatic  BP 135/89   Pulse 70   Temp 98.6 F (37 C)   Resp 19   Wt 191 lb 1.6 oz (86.7 kg)   SpO2 98%   BMI 25.21 kg/m   Filed Weights   10/14/22 1317  Weight: 191 lb 1.6 oz (86.7 kg)    Physical Exam Vitals and nursing note reviewed.  HENT:     Head: Normocephalic and atraumatic.     Mouth/Throat:     Pharynx: Oropharynx is clear.  Eyes:     Extraocular Movements: Extraocular movements intact.     Pupils: Pupils are equal, round, and reactive  to light.  Cardiovascular:     Rate and Rhythm: Normal rate and regular rhythm.  Pulmonary:     Comments: Decreased breath sounds bilaterally.  Abdominal:     Palpations: Abdomen is soft.  Musculoskeletal:        General: Normal range of motion.     Cervical back: Normal range of motion.  Skin:    General: Skin is warm.  Neurological:     General: No focal deficit present.     Mental Status: He is alert and oriented to person, place, and time.  Psychiatric:        Behavior: Behavior normal.        Judgment: Judgment normal.        LABORATORY DATA:  I have reviewed the data as listed    Component Value Date/Time   NA 137 10/14/2022 1244   NA 144 05/16/2022 0953   K 3.6 10/14/2022 1244   CL 106 10/14/2022 1244   CO2 22 10/14/2022 1244   GLUCOSE 104 (H) 10/14/2022 1244   BUN 17 10/14/2022 1244   BUN 12 05/16/2022 0953   CREATININE 1.11 10/14/2022 1244   CALCIUM 8.7 (L) 10/14/2022 1244   PROT 5.8 (L) 10/14/2022 1244   PROT 5.7 (L) 05/16/2022 0953   ALBUMIN 3.7 10/14/2022 1244   ALBUMIN 3.9 05/16/2022 0953   AST 18 10/14/2022 1244   ALT 18 10/14/2022 1244   ALKPHOS 57 10/14/2022 1244   BILITOT 0.8 10/14/2022 1244   BILITOT 0.5 05/16/2022 0953   GFRNONAA >60 10/14/2022 1244   GFRAA 75 05/10/2020 1017    No results found for: "SPEP", "UPEP"  Lab Results  Component Value Date   WBC 10.0 10/14/2022   NEUTROABS 7.8 (H) 10/14/2022   HGB 14.6 10/14/2022   HCT 42.9 10/14/2022   MCV 89.4 10/14/2022   PLT 239 10/14/2022      Chemistry      Component Value Date/Time   NA 137 10/14/2022 1244   NA 144 05/16/2022 0953   K 3.6 10/14/2022 1244   CL 106 10/14/2022 1244   CO2 22 10/14/2022 1244   BUN 17 10/14/2022 1244   BUN 12 05/16/2022 0953   CREATININE 1.11 10/14/2022 1244   GLU 96 10/19/2014 0000      Component Value Date/Time   CALCIUM 8.7 (L) 10/14/2022 1244   ALKPHOS 57 10/14/2022 1244   AST 18 10/14/2022 1244   ALT 18 10/14/2022 1244   BILITOT 0.8  10/14/2022 1244   BILITOT 0.5 05/16/2022 0953       RADIOGRAPHIC STUDIES: I have personally reviewed the radiological images as listed  and agreed with the findings in the report. No results found.   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  S/p neoadjuvant Keytruda x4- -Status post axillary lymph node dissection-with residual micrometastatic disease.  Patient currently on adjuvant Keytruda.  Tolerating with mild to moderate difficulties see below  # proceed with  [place] #18  today of planned total 18. Labs today reviewed. [place] with treatment.  Will order baseline CT scan chest and pelvis in approximately 1 month.STABLE.  #Viral URI- - [sep 2023]-COVID testing negative; symptoms resolved.  Recommend claritin D. STABLE.  #Endocrinopathies: Hypothyroidism/adrenal insufficiency: [s/p evaluation- SEP 2023- Endocrinology-] iatrogenic hypothyroidism [AUG 2023; TSH- 11]-on Synthroid 125  mcg once a day; SEP 2023- TSH 0.15; Normal T3/T4-. Monitor for now.  Adrenal insufficiency -extreme fatigue--currently on hydrocortsione 20 mg in the morning and 10 mg at night. Pt will call re: Thyroid labs as per endo.  STABLE.  # Vaccinations: ok with flu/Covid shots.   # DISPOSITION:   # Keytruda today # follow up in 1 months-  MD; labs- cbc/cmp;thyroid profile; CT CAP-    Dr.B.         Orders Placed This Encounter  Procedures   CT CHEST ABDOMEN PELVIS W CONTRAST    Standing Status:   Future    Standing Expiration Date:   10/15/2023    Order Specific Question:   Preferred imaging location?    Answer:   [facility]    Order Specific Question:   Radiology Contrast Protocol - do NOT remove file path    Answer:   [email or web address]   CBC with Differential/Platelet    Standing Status:   Future    Standing Expiration Date:   10/14/2023   Comprehensive metabolic panel     Standing Status:   Future    Standing Expiration Date:   10/14/2023   Thyroid Panel With TSH    Standing Status:   Future    Standing Expiration Date:   10/15/2023    All questions were answered. The patient knows to call the clinic with any problems, questions or concerns.      Cammie Sickle, MD 10/21/2022 9:23 AM

## 2022-10-14 NOTE — Assessment & Plan Note (Addendum)
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  S/p neoadjuvant Keytruda x4- -Status post axillary lymph node dissection-with residual micrometastatic disease.  Patient currently on adjuvant Keytruda.  Tolerating with mild to moderate difficulties see below  # proceed with  [place] #18  today of planned total 18. Labs today reviewed. [place] with treatment.  Will order baseline CT scan chest and pelvis in approximately 1 month.STABLE.  #Viral URI- - [sep 2023]-COVID testing negative; symptoms resolved.  Recommend claritin D. STABLE.  #Endocrinopathies: Hypothyroidism/adrenal insufficiency: [s/p evaluation- SEP 2023- Endocrinology-] iatrogenic hypothyroidism [AUG 2023; TSH- 11]-on Synthroid 125  mcg once a day; SEP 2023- TSH 0.15; Normal T3/T4-. Monitor for now.  Adrenal insufficiency -extreme fatigue--currently on hydrocortsione 20 mg in the morning and 10 mg at night. Pt will call re: Thyroid labs as per endo.  STABLE.  # Vaccinations: ok with flu/Covid shots.   # DISPOSITION:   # Keytruda today # follow up in 1 months-  MD; labs- cbc/cmp;thyroid profile; CT CAP-    Dr.B.

## 2022-10-14 NOTE — Progress Notes (Signed)
Patient states he has a little head congestion going on at the moment.

## 2022-10-15 LAB — T4: T4, Total: 8.4 ug/dL (ref 4.5–12.0)

## 2022-10-18 DIAGNOSIS — R69 Illness, unspecified: Secondary | ICD-10-CM | POA: Diagnosis not present

## 2022-10-21 ENCOUNTER — Encounter: Payer: Self-pay | Admitting: Internal Medicine

## 2022-10-23 DIAGNOSIS — H31003 Unspecified chorioretinal scars, bilateral: Secondary | ICD-10-CM | POA: Diagnosis not present

## 2022-10-23 DIAGNOSIS — H43813 Vitreous degeneration, bilateral: Secondary | ICD-10-CM | POA: Diagnosis not present

## 2022-10-23 DIAGNOSIS — H2513 Age-related nuclear cataract, bilateral: Secondary | ICD-10-CM | POA: Diagnosis not present

## 2022-10-23 DIAGNOSIS — H40013 Open angle with borderline findings, low risk, bilateral: Secondary | ICD-10-CM | POA: Diagnosis not present

## 2022-10-23 DIAGNOSIS — H5213 Myopia, bilateral: Secondary | ICD-10-CM | POA: Diagnosis not present

## 2022-10-23 DIAGNOSIS — H524 Presbyopia: Secondary | ICD-10-CM | POA: Diagnosis not present

## 2022-10-27 ENCOUNTER — Other Ambulatory Visit: Payer: Self-pay | Admitting: Nurse Practitioner

## 2022-11-11 ENCOUNTER — Ambulatory Visit
Admission: RE | Admit: 2022-11-11 | Discharge: 2022-11-11 | Disposition: A | Discharge status: Home / Self Care | Payer: Medicare HMO | Source: Ambulatory Visit | Attending: Internal Medicine | Admitting: Internal Medicine

## 2022-11-11 DIAGNOSIS — K769 Liver disease, unspecified: Secondary | ICD-10-CM | POA: Diagnosis not present

## 2022-11-11 DIAGNOSIS — C779 Secondary and unspecified malignant neoplasm of lymph node, unspecified: Secondary | ICD-10-CM | POA: Insufficient documentation

## 2022-11-11 DIAGNOSIS — K5730 Diverticulosis of large intestine without perforation or abscess without bleeding: Secondary | ICD-10-CM | POA: Diagnosis not present

## 2022-11-11 DIAGNOSIS — J984 Other disorders of lung: Secondary | ICD-10-CM | POA: Diagnosis not present

## 2022-11-11 MED ORDER — IOHEXOL 300 MG/ML  SOLN
100.0000 mL | Freq: Once | INTRAMUSCULAR | Status: AC | PRN
  Administered 2022-11-11: 100 mL via INTRAVENOUS

## 2022-11-12 DIAGNOSIS — K219 Gastro-esophageal reflux disease without esophagitis: Secondary | ICD-10-CM | POA: Diagnosis not present

## 2022-11-12 DIAGNOSIS — Z8719 Personal history of other diseases of the digestive system: Secondary | ICD-10-CM | POA: Diagnosis not present

## 2022-11-13 DIAGNOSIS — M5033 Other cervical disc degeneration, cervicothoracic region: Secondary | ICD-10-CM | POA: Diagnosis not present

## 2022-11-13 DIAGNOSIS — M62830 Muscle spasm of back: Secondary | ICD-10-CM | POA: Diagnosis not present

## 2022-11-13 DIAGNOSIS — M9903 Segmental and somatic dysfunction of lumbar region: Secondary | ICD-10-CM | POA: Diagnosis not present

## 2022-11-13 DIAGNOSIS — M9901 Segmental and somatic dysfunction of cervical region: Secondary | ICD-10-CM | POA: Diagnosis not present

## 2022-11-14 ENCOUNTER — Inpatient Hospital Stay: Payer: Medicare HMO | Attending: Oncology

## 2022-11-14 ENCOUNTER — Encounter: Payer: Self-pay | Admitting: Internal Medicine

## 2022-11-14 ENCOUNTER — Inpatient Hospital Stay (HOSPITAL_BASED_OUTPATIENT_CLINIC_OR_DEPARTMENT_OTHER): Payer: Medicare HMO | Admitting: Internal Medicine

## 2022-11-14 DIAGNOSIS — C4359 Malignant melanoma of other part of trunk: Secondary | ICD-10-CM | POA: Insufficient documentation

## 2022-11-14 DIAGNOSIS — C773 Secondary and unspecified malignant neoplasm of axilla and upper limb lymph nodes: Secondary | ICD-10-CM | POA: Diagnosis not present

## 2022-11-14 DIAGNOSIS — C779 Secondary and unspecified malignant neoplasm of lymph node, unspecified: Secondary | ICD-10-CM | POA: Diagnosis not present

## 2022-11-14 DIAGNOSIS — R5383 Other fatigue: Secondary | ICD-10-CM | POA: Diagnosis not present

## 2022-11-14 DIAGNOSIS — E2740 Unspecified adrenocortical insufficiency: Secondary | ICD-10-CM | POA: Diagnosis not present

## 2022-11-14 DIAGNOSIS — Z801 Family history of malignant neoplasm of trachea, bronchus and lung: Secondary | ICD-10-CM | POA: Diagnosis not present

## 2022-11-14 DIAGNOSIS — E039 Hypothyroidism, unspecified: Secondary | ICD-10-CM | POA: Diagnosis not present

## 2022-11-14 DIAGNOSIS — Z79899 Other long term (current) drug therapy: Secondary | ICD-10-CM | POA: Insufficient documentation

## 2022-11-14 LAB — CBC WITH DIFFERENTIAL/PLATELET
Abs Immature Granulocytes: 0.01 10*3/uL (ref 0.00–0.07)
Basophils Absolute: 0 10*3/uL (ref 0.0–0.1)
Basophils Relative: 1 %
Eosinophils Absolute: 0.2 10*3/uL (ref 0.0–0.5)
Eosinophils Relative: 3 %
HCT: 42.6 % (ref 39.0–52.0)
Hemoglobin: 14.2 g/dL (ref 13.0–17.0)
Immature Granulocytes: 0 %
Lymphocytes Relative: 36 %
Lymphs Abs: 1.8 10*3/uL (ref 0.7–4.0)
MCH: 30.7 pg (ref 26.0–34.0)
MCHC: 33.3 g/dL (ref 30.0–36.0)
MCV: 92.2 fL (ref 80.0–100.0)
Monocytes Absolute: 0.4 10*3/uL (ref 0.1–1.0)
Monocytes Relative: 9 %
Neutro Abs: 2.5 10*3/uL (ref 1.7–7.7)
Neutrophils Relative %: 51 %
Platelets: 205 10*3/uL (ref 150–400)
RBC: 4.62 MIL/uL (ref 4.22–5.81)
RDW: 13.6 % (ref 11.5–15.5)
WBC: 4.9 10*3/uL (ref 4.0–10.5)
nRBC: 0 % (ref 0.0–0.2)

## 2022-11-14 LAB — COMPREHENSIVE METABOLIC PANEL
ALT: 16 U/L (ref 0–44)
AST: 21 U/L (ref 15–41)
Albumin: 3.8 g/dL (ref 3.5–5.0)
Alkaline Phosphatase: 46 U/L (ref 38–126)
Anion gap: 8 (ref 5–15)
BUN: 16 mg/dL (ref 8–23)
CO2: 23 mmol/L (ref 22–32)
Calcium: 9 mg/dL (ref 8.9–10.3)
Chloride: 108 mmol/L (ref 98–111)
Creatinine, Ser: 1.01 mg/dL (ref 0.61–1.24)
GFR, Estimated: >60 mL/min (ref >60)
Glucose, Bld: 103 mg/dL — ABNORMAL HIGH (ref 70–99)
Potassium: 3.7 mmol/L (ref 3.5–5.1)
Sodium: 139 mmol/L (ref 135–145)
Total Bilirubin: 0.7 mg/dL (ref 0.3–1.2)
Total Protein: 6.1 g/dL — ABNORMAL LOW (ref 6.5–8.1)

## 2022-11-14 NOTE — Progress Notes (Signed)
Patient denies new problems/concerns today.   

## 2022-11-14 NOTE — Assessment & Plan Note (Addendum)
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  S/p neoadjuvant Keytruda x4- -Status post axillary lymph node dissection-with residual micrometastatic disease.  Patient currently s/p  adjuvant Keytruda x18 cycles.   Tolerated with mild to moderate difficulties see belowI. FEB 2nd, 2024-  Surgical change in the left axilla of interval lymph node resection; No evidence of metastatic disease within the chest, abdomen, or pelvis. Will order imaging for 3 months CT scan chest and pelvis.   # slightly elevated BP- 140/80s- [? Cortef; no weight gain] recommend monitoring for now.   #Endocrinopathies: Hypothyroidism/adrenal insufficiency: [s/p evaluation- SEP 2023- Endocrinology-] iatrogenic hypothyroidism [AUG 2023; TSH- 11]-on Synthroid 125  mcg once a day; SEP 2023- TSH 0.15; Normal T3/T4-. Monitor for now.  Adrenal insufficiency -extreme fatigue--currently on hydrocortsione 20 mg in the morning and 10 mg at night. Pt will call re: Thyroid labs as per endo.  Stable. Follow up in march, 2024  #Incidental findings on Imaging  CT, FEB 2024: Colonic diverticulosis without findings of acute diverticulitis; Moderate volume of formed stool in the colon.; Aortic Atherosclerosis;  reviewed/discussed/counseled the patient.   # DISPOSITION:   # follow up in 3 months-MD; labs- cbc/cmp;thyroid profile; CT CAP-  Dr.B.  # I reviewed the blood work- with the patient in detail; also reviewed the imaging independently [as summarized above]; and with the patient in detail.

## 2022-11-14 NOTE — Progress Notes (Signed)
[place] OFFICE PROGRESS NOTE  Patient Care Team: Eulas Post, MD as PCP - General (Family Medicine) Cammie Sickle, MD as Consulting Physician (Oncology)   Cancer Staging  Malignant melanoma metastatic to lymph node [facility]) Staging form: Melanoma of the Skin, AJCC 8th Edition - Clinical: Stage III (cTX, [name], [name], MD on 08/13/2021   Oncology History Overview Note  #  2015- left collar bone [Dr.Kowalski]- s/p resection- 0.65 mm  #  A. 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   There is sufficient material for ancillary molecular testing if needed  (block A3, A1, A2).   Comment:  Per provided outside pathology report the patient had an "at least pT1b"  malignant melanoma of the left lateral infraclavicular skin in 2015.  Touch preparations demonstrate predominantly discohesive large  pleomorphic cells, some with binucleation.  HE sections demonstrate  lymph node parenchyma involved by metastatic neoplasm.  The neoplastic  cells are pleomorphic with binucleation, focal intranuclear inclusions,  and associated tumor necrosis. The neoplastic cells demonstrate the  following pattern of immunoreactivity:  S100: Positive  SOX10: Positive  HMB-45: Negative  Melan-A: Negative  Super pancytokeratin: Negative  CD45: Negative  This pattern of immunoreactivity supports the above diagnosis.   # NOV 7th, 2022-neoadjuvant Keytruda cycle #1  # JAN, 26th, [name],  majority of these foci measure 1-2 mm in greatest dimension, with the largest measuring 4 mm.  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 hypothyroidism  # SEP 2023-adrenal insufficiency- extensive work-up including MRI brain-negative for any  hypophysitis; however cortisol 0.8; with normal ATCH [AM]- [s/p Endocrine; [place]]-     Malignant melanoma metastatic to lymph node ([place])  07/17/2021 Initial Diagnosis   Malignant melanoma metastatic to lymph node ([place])   08/13/2021 Cancer Staging   Staging form: Melanoma of the Skin, AJCC 8th Edition - Clinical: Stage III (cTX, [name], [name], MD on 08/13/2021   08/13/2021 - 03/25/2022 Chemotherapy   Patient is on Treatment Plan : MELANOMA Pembrolizumab q21d     08/13/2021 -  Chemotherapy   Patient is on Treatment Plan : MELANOMA Pembrolizumab (200) q21d      HPI: Ambulating independently.  Accompanied by his wife.   [name] 67 y.o.  male pleasant patient above history of recurrent melanoma left axillary lymphadenopathy-s/p surgery is currently status post adjuvant Keytruda-with iatrogenic hypothyroidism /adrenal insufficiency is here for follow-up/to review the results of his baseline posttreatment CAT scan.  Patient denies any shortness of breath or cough.  No fever no chills.  No skin rash.  Review of Systems  Constitutional:  Negative for chills, diaphoresis, fever, malaise/fatigue and weight loss.  HENT:  Negative for nosebleeds and sore throat.   Eyes:  Negative for double vision.  Respiratory:  Negative for hemoptysis, sputum production, shortness of breath and wheezing.   Cardiovascular:  Negative for chest pain, palpitations, orthopnea and leg swelling.  Gastrointestinal:  Negative for abdominal pain, blood in stool, constipation, diarrhea, heartburn, melena, nausea and vomiting.  Genitourinary:  Negative for dysuria, frequency and urgency.  Musculoskeletal:  Positive for myalgias. Negative for back pain and joint pain.  Skin: Negative.  Negative for itching and rash.  Neurological:  Positive for tingling. Negative for dizziness, focal weakness and weakness.  Endo/Heme/Allergies:  Does not bruise/bleed easily.  Psychiatric/Behavioral:   Negative for depression. The patient is not nervous/anxious and does not have insomnia.       PAST MEDICAL HISTORY :  Past Medical History:  Diagnosis Date   Basal cell carcinoma 03/10/2013   Right medial forehead. Excised, margins free.   Dysplastic nevus 11/05/2006   Mid back paraspinal, [name], close to edge. Excised 12/24/2006, margins free.   Dysplastic nevus 12/15/2008   Left lat. pectoral area. Moderate atypia, extends to one edge.    Dysplastic nevus 12/07/2009   Left mid side. Moderate atypia, close to margin.   Dysplastic nevus 12/07/2009   Right posterior waistline. Moderate atypia, close to margin.   Dysplastic nevus 02/10/2014   Left paraspinal mid back. Moderate atypia, lateral and deep margin involved.    Dysplastic nevus 06/16/2014   Left epigastric. Mild atypia, deep margin involved.    GERD (gastroesophageal reflux disease)    Hx of basal cell carcinoma    multiple sites   Hx of malignant melanoma 11/11/2013   L lateral infraclavicular, superficial spreading, Breslow's 0.67m, Clark's level III   Hypothyroidism    Melanoma ([facility] 11/11/2013   Left lateral infraclavicular. MM, superficial spreading. Anatomic level III. Tumor thickness 0.652m Excised 11/24/2013, margins free.    Metastatic melanoma ([facility]/2022   L axilla, excised by Dr. ByFleet Contrasfollowd by Dr. BrBurlene Arntith KeBeryle Flock  PAST SURGICAL HISTORY :   Past Surgical History:  Procedure Laterality Date   AXILLARY LYMPH NODE DISSECTION Left 11/02/2021   Procedure: AXILLARY LYMPH NODE DISSECTION;  [name];  [name];  Service: General;  Laterality: Left;   COLONOSCOPY WITH PROPOFOL N/A 01/20/2017   Procedure: COLONOSCOPY WITH PROPOFOL;  [name];  [facility];  Service: Endoscopy;  [name];   ESOPHAGOGASTRODUODENOSCOPY (EGD) WITH ESOPHAGEAL DILATION     MELANOMA EXCISION Right 11/24/2013    lateral infraclavicular   WISDOM TOOTH EXTRACTION     WRIST SURGERY Left 2022    FAMILY HISTORY :   Family History  Problem Relation Age of Onset   Macular degeneration Mother    Hyperlipidemia Mother    Heart disease Father    Heart attack Father    Lung cancer Father        metastasized    SOCIAL HISTORY:   Social History   Tobacco Use   Smoking status: Never   Smokeless tobacco: Never  Vaping Use   Vaping Use: Never used  Substance Use Topics   Alcohol use: Yes    Comment: about 1-3 glass of wine with dinner   Drug use: No    ALLERGIES:  has No Known Allergies.  MEDICATIONS:  Current Outpatient Medications  Medication Sig Dispense Refill   aspirin 81 MG tablet Take 81 mg by mouth daily.     hydrocortisone (CORTEF) 10 MG tablet TAKE 2 PILLS IN THE MORNING AND 1 PILL AT NIGHT. DO NOT STOP UNTIL DIRECTED. TAKE WITH FOOD. 90 tablet 1   levothyroxine (SYNTHROID) 125 MCG tablet Take 1 tablet (125 mcg total) by mouth daily before breakfast. 90 tablet 3   Multiple Vitamins tablet Take 1 tablet by mouth daily.     Multiple Vitamins-Minerals (PRESERVISION AREDS 2 PO) Take 1 capsule by mouth in the morning and at bedtime.     pantoprazole (PROTONIX) 20 MG tablet Take 20 mg by mouth daily.  0   Pembrolizumab (  KEYTRUDA IV) Inject 1 Dose into the vein every 21 ( twenty-one) days.     No current facility-administered medications for this visit.    PHYSICAL EXAMINATION: ECOG PERFORMANCE STATUS: 0 - Asymptomatic  BP (!) 148/88 (BP Location: Right Arm, Patient Position: Sitting)   Pulse 61   Temp (!) 96.6 F (35.9 C) (Tympanic)   Resp 16   Wt 190 lb (86.2 kg)   BMI 25.07 kg/m   Filed Weights   11/14/22 0900  Weight: 190 lb (86.2 kg)    Physical Exam Vitals and nursing note reviewed.  HENT:     Head: Normocephalic and atraumatic.     Mouth/Throat:     Pharynx: Oropharynx is clear.  Eyes:     Extraocular Movements: Extraocular movements intact.     Pupils: Pupils  are equal, round, and reactive to light.  Cardiovascular:     Rate and Rhythm: Normal rate and regular rhythm.  Pulmonary:     Comments: Decreased breath sounds bilaterally.  Abdominal:     Palpations: Abdomen is soft.  Musculoskeletal:        General: Normal range of motion.     Cervical back: Normal range of motion.  Skin:    General: Skin is warm.  Neurological:     General: No focal deficit present.     Mental Status: He is alert and oriented to person, place, and time.  Psychiatric:        Behavior: Behavior normal.        Judgment: Judgment normal.        LABORATORY DATA:  I have reviewed the data as listed    Component Value Date/Time   NA 139 11/14/2022 0942   NA 144 05/16/2022 0953   K 3.7 11/14/2022 0942   CL 108 11/14/2022 0942   CO2 23 11/14/2022 0942   GLUCOSE 103 (H) 11/14/2022 0942   BUN 16 11/14/2022 0942   BUN 12 05/16/2022 0953   CREATININE 1.01 11/14/2022 0942   CALCIUM 9.0 11/14/2022 0942   PROT 6.1 (L) 11/14/2022 0942   PROT 5.7 (L) 05/16/2022 0953   ALBUMIN 3.8 11/14/2022 0942   ALBUMIN 3.9 05/16/2022 0953   AST 21 11/14/2022 0942   ALT 16 11/14/2022 0942   ALKPHOS 46 11/14/2022 0942   BILITOT 0.7 11/14/2022 0942   BILITOT 0.5 05/16/2022 0953   GFRNONAA >60 11/14/2022 0942   GFRAA 75 05/10/2020 1017    No results found for: "SPEP", "UPEP"  Lab Results  Component Value Date   WBC 4.9 11/14/2022   NEUTROABS 2.5 11/14/2022   HGB 14.2 11/14/2022   HCT 42.6 11/14/2022   MCV 92.2 11/14/2022   PLT 205 11/14/2022      Chemistry      Component Value Date/Time   NA 139 11/14/2022 0942   NA 144 05/16/2022 0953   K 3.7 11/14/2022 0942   CL 108 11/14/2022 0942   CO2 23 11/14/2022 0942   BUN 16 11/14/2022 0942   BUN 12 05/16/2022 0953   CREATININE 1.01 11/14/2022 0942   GLU 96 10/19/2014 0000      Component Value Date/Time   CALCIUM 9.0 11/14/2022 0942   ALKPHOS 46 11/14/2022 0942   AST 21 11/14/2022 0942   ALT 16 11/14/2022 0942    BILITOT 0.7 11/14/2022 0942   BILITOT 0.5 05/16/2022 0953       RADIOGRAPHIC STUDIES: I have personally reviewed the radiological images as listed and agreed with the findings in the report.  No results found.   ASSESSMENT & PLAN:  Malignant melanoma metastatic to lymph node ([place]) # Recurrent/metastatic melanoma to the left axilla-STAGE-III.  S/p neoadjuvant Keytruda x4- -Status post axillary lymph node dissection-with residual micrometastatic disease.  Patient currently s/p  adjuvant Keytruda x18 cycles.   Tolerated with mild to moderate difficulties see belowI. FEB 2nd, 2024-  Surgical change in the left axilla of interval lymph node resection; No evidence of metastatic disease within the chest, abdomen, or pelvis. Will order imaging for 3 months CT scan chest and pelvis.   # slightly elevated BP- 140/80s- [? Cortef; no weight gain] recommend monitoring for now.   #Endocrinopathies: Hypothyroidism/adrenal insufficiency: [s/p evaluation- SEP 2023- Endocrinology-] iatrogenic hypothyroidism [AUG 2023; TSH- 11]-on Synthroid 125  mcg once a day; SEP 2023- TSH 0.15; Normal T3/T4-. Monitor for now.  Adrenal insufficiency -extreme fatigue--currently on hydrocortsione 20 mg in the morning and 10 mg at night. Pt will call re: Thyroid labs as per endo.  Stable. Follow up in march, 2024  #Incidental findings on Imaging  CT, FEB 2024: Colonic diverticulosis without findings of acute diverticulitis; Moderate volume of formed stool in the colon.; Aortic Atherosclerosis;  reviewed/discussed/counseled the patient.   # DISPOSITION:   # follow up in 3 months-MD; labs- cbc/cmp;thyroid profile; CT CAP-  Dr.B.  # I reviewed the blood work- with the patient in detail; also reviewed the imaging independently [as summarized above]; and with the patient in detail.       Orders Placed This Encounter  Procedures   CT CHEST ABDOMEN PELVIS W CONTRAST    Standing Status:   Future    Standing Expiration Date:    11/15/2023    Order Specific Question:   Preferred imaging location?    Answer:   [facility]    Order Specific Question:   Radiology Contrast Protocol - do NOT remove file path    Answer:   [email or web address]   CBC with Differential/Platelet    Standing Status:   Future    Standing Expiration Date:   11/15/2023   Comprehensive metabolic panel    Standing Status:   Future    Standing Expiration Date:   11/15/2023   Thyroid Panel With TSH    Standing Status:   Future    Standing Expiration Date:   11/15/2023    All questions were answered. The patient knows to call the clinic with any problems, questions or concerns.      Cammie Sickle, MD 11/14/2022 10:55 AM

## 2022-11-15 LAB — THYROID PANEL WITH TSH
Free Thyroxine Index: 2.2 (ref 1.2–4.9)
T3 Uptake Ratio: 28 % (ref 24–39)
T4, Total: 8 ug/dL (ref 4.5–12.0)
TSH: 0.479 u[IU]/mL (ref 0.450–4.500)

## 2022-11-21 DIAGNOSIS — R69 Illness, unspecified: Secondary | ICD-10-CM | POA: Diagnosis not present

## 2022-12-11 DIAGNOSIS — M9903 Segmental and somatic dysfunction of lumbar region: Secondary | ICD-10-CM | POA: Diagnosis not present

## 2022-12-11 DIAGNOSIS — M62830 Muscle spasm of back: Secondary | ICD-10-CM | POA: Diagnosis not present

## 2022-12-11 DIAGNOSIS — M9901 Segmental and somatic dysfunction of cervical region: Secondary | ICD-10-CM | POA: Diagnosis not present

## 2022-12-11 DIAGNOSIS — M5033 Other cervical disc degeneration, cervicothoracic region: Secondary | ICD-10-CM | POA: Diagnosis not present

## 2022-12-19 ENCOUNTER — Encounter: Payer: Self-pay | Admitting: Internal Medicine

## 2022-12-19 ENCOUNTER — Inpatient Hospital Stay: Payer: Medicare HMO | Attending: Oncology | Admitting: Nurse Practitioner

## 2022-12-19 VITALS — BP 139/86 | HR 72 | Temp 98.5°F | Wt 192.0 lb

## 2022-12-19 DIAGNOSIS — M25522 Pain in left elbow: Secondary | ICD-10-CM | POA: Insufficient documentation

## 2022-12-19 DIAGNOSIS — C4359 Malignant melanoma of other part of trunk: Secondary | ICD-10-CM | POA: Insufficient documentation

## 2022-12-19 DIAGNOSIS — C773 Secondary and unspecified malignant neoplasm of axilla and upper limb lymph nodes: Secondary | ICD-10-CM | POA: Diagnosis not present

## 2022-12-19 DIAGNOSIS — Z801 Family history of malignant neoplasm of trachea, bronchus and lung: Secondary | ICD-10-CM | POA: Diagnosis not present

## 2022-12-19 DIAGNOSIS — M791 Myalgia, unspecified site: Secondary | ICD-10-CM | POA: Diagnosis not present

## 2022-12-19 NOTE — Progress Notes (Signed)
[facility]pt[facility] [place] [facility] [place]. [facility] [address], [place], [place] [address] [phone] (phone) [phone] (fax)  Patient Care Team: Eulas Post, MD as PCP - General (Family Medicine) Cammie Sickle, MD as Consulting Physician (Oncology)   Name of the patient: [name]  [id number]  Apr 11, 1956   Date of visit: 12/19/22  Diagnosis- Malignant Melanoma  Chief complaint/ Reason for visit- myalgias & left elbow arthralgia  Heme/Onc history:  Oncology History Overview Note  #  2015- left collar bone [Dr.Kowalski]- s/p resection- 0.65 mm  #  A. 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   There is sufficient material for ancillary molecular testing if needed  (block A3, A1, A2).   Comment:  Per provided outside pathology report the patient had an "at least pT1b"  malignant melanoma of the left lateral infraclavicular skin in 2015.  Touch preparations demonstrate predominantly discohesive large  pleomorphic cells, some with binucleation.  HE sections demonstrate  lymph node parenchyma involved by metastatic neoplasm.  The neoplastic  cells are pleomorphic with binucleation, focal intranuclear inclusions,  and associated tumor necrosis. The neoplastic cells demonstrate the  following pattern of immunoreactivity:  S100: Positive  SOX10: Positive  HMB-45: Negative  Melan-A: Negative  Super pancytokeratin: Negative  CD45: Negative  This pattern of immunoreactivity supports the above diagnosis.   # NOV 7th, 2022-neoadjuvant Keytruda cycle #1  # JAN, 26th, [name],  majority of these foci measure 1-2 mm in greatest dimension, with the largest measuring 4 mm.  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 Symptoms started 1-2 weeks ago and have persisted. Describes tightness of left inner elbow radiating up and down arm. Says he feels diffusely tight and aching. His wife has noticed some mild swelling of left arm. No redness. Has been exerting himself more consistently recently. He has planned to go to [facility] on ski trip next week andq [employer]. No discrete mass or nodularity that he has noticed. He feels well otherwise. No hot flashes, night sweats, or unintentional weight loss.   Review of systems- Review of Systems  Constitutional:  Negative for chills, fever, malaise/fatigue and weight loss.  HENT:  Negative for hearing loss, nosebleeds, sore throat and tinnitus.   Eyes:  Negative for blurred vision and double vision.  Respiratory:  Negative for cough, hemoptysis, shortness of breath  and wheezing.   Cardiovascular:  Negative for chest pain, palpitations and leg swelling.  Gastrointestinal:  Negative for abdominal pain, blood in stool, constipation,  diarrhea, melena, nausea and vomiting.  Genitourinary:  Negative for dysuria and urgency.  Musculoskeletal:  Negative for back pain, falls, joint pain, myalgias and neck pain.  Skin:  Negative for itching and rash.  Neurological:  Negative for dizziness, tingling, sensory change, loss of consciousness, weakness and headaches.  Endo/Heme/Allergies:  Negative for environmental allergies. Does not bruise/bleed easily.  Psychiatric/Behavioral:  Negative for depression. The patient is not nervous/anxious and does not have insomnia.      Current treatment- surveillance  No Known Allergies  Past Medical History:  Diagnosis Date   Basal cell carcinoma 03/10/2013   Right medial forehead. Excised, margins free.   Dysplastic nevus 11/05/2006   Mid back paraspinal, [name], close to edge. Excised 12/24/2006, margins free.   Dysplastic nevus 12/15/2008   Left lat. pectoral area. Moderate atypia, extends to one edge.    Dysplastic nevus 12/07/2009   Left mid side. Moderate atypia, close to margin.   Dysplastic nevus 12/07/2009   Right posterior waistline. Moderate atypia, close to margin.   Dysplastic nevus 02/10/2014   Left paraspinal mid back. Moderate atypia, lateral and deep margin involved.    Dysplastic nevus 06/16/2014   Left epigastric. Mild atypia, deep margin involved.    GERD (gastroesophageal reflux disease)    Hx of basal cell carcinoma    multiple sites   Hx of malignant melanoma 11/11/2013   L lateral infraclavicular, superficial spreading, Breslow's 0.12mm, Clark's level III   Hypothyroidism    Melanoma ([place]) 11/11/2013   Left lateral infraclavicular. MM, superficial spreading. Anatomic level III. Tumor thickness 0.42mm. Excised 11/24/2013, margins free.    Metastatic melanoma ([place]) 07/2021   L axilla, excised by Dr. Fleet Contras, followd by Dr. Burlene Arnt with [name]    Past Surgical History:  Procedure Laterality Date   AXILLARY LYMPH  NODE DISSECTION Left 11/02/2021   Procedure: AXILLARY LYMPH NODE DISSECTION;  Surgeon: Lilburn Bellow, MD;  [name];  Service: General;  Laterality: Left;   COLONOSCOPY WITH PROPOFOL N/A 01/20/2017   Procedure: COLONOSCOPY WITH PROPOFOL;  Surgeon: Lollie Sails, MD;  Location: [facility] ENDOSCOPY;  Service: Endoscopy;  [name];   ESOPHAGOGASTRODUODENOSCOPY (EGD) WITH ESOPHAGEAL DILATION     MELANOMA EXCISION Right 11/24/2013   lateral infraclavicular   WISDOM TOOTH EXTRACTION     WRIST SURGERY Left 2022    Social History   Socioeconomic History   Marital status: Married    Spouse name: Not on file   Number of children: Not on file   Years of education: Not on file   Highest education level: Not on file  Occupational History   Not on file  Tobacco Use   Smoking status: Never   Smokeless tobacco: Never  Vaping Use   Vaping Use: Never used  Substance and Sexual Activity   Alcohol use: Yes    Comment: about 1-3 glass of wine with dinner   Drug use: No   Sexual activity: Not on file  Other Topics Concern   Not on file  Social History Narrative   Lives with wife in [place]; no children; used to be in banking retd. Never smoked; social alcohol.    Social Determinants of Health   Financial Resource Strain: Not on file  Food Insecurity: Not on file  Transportation Needs: Not on file  Physical Activity: Not on file  Stress: Not on file  Social Connections: Not on file  Intimate Partner Violence: Not on file    Family History  Problem Relation Age of Onset   Macular degeneration Mother    Hyperlipidemia Mother    Heart disease Father    Heart attack Father    Lung cancer Father        metastasized     Current Outpatient Medications:    aspirin 81 MG tablet, Take 81 mg by mouth daily., Disp: , Rfl:    hydrocortisone (CORTEF) 10 MG tablet, TAKE 2 PILLS IN THE MORNING AND 1 PILL AT NIGHT. DO NOT STOP UNTIL DIRECTED. TAKE WITH FOOD., Disp: 90  tablet, Rfl: 1   levothyroxine (SYNTHROID) 125 MCG tablet, Take 1 tablet (125 mcg total) by mouth daily before breakfast., Disp: 90 tablet, Rfl: 3   Multiple Vitamins tablet, Take 1 tablet by mouth daily., Disp: , Rfl:    Multiple Vitamins-Minerals (PRESERVISION AREDS 2 PO), Take 1 capsule by mouth in the morning and at bedtime., Disp: , Rfl:    pantoprazole (PROTONIX) 20 MG tablet, Take 20 mg by mouth daily., Disp: , Rfl: 0   Pembrolizumab (KEYTRUDA IV), Inject 1 Dose into the vein every 21 ( twenty-one) days., Disp: , Rfl:   Physical exam:  Vitals:   12/19/22 1452  BP: 139/86  Pulse: 72  Temp: 98.5 F (36.9 C)  TempSrc: Oral  SpO2: 99%  Weight: 192 lb (87.1 kg)   Physical Exam Constitutional:      Appearance: He is not ill-appearing.  Cardiovascular:     Rate and Rhythm: Normal rate and regular rhythm.  Pulmonary:     Effort: Pulmonary effort is normal.  Abdominal:     General: There is no distension.     Tenderness: There is no abdominal tenderness.  Musculoskeletal:     Cervical back: No rigidity or tenderness.     Comments: Very slight generalized erythema of the back of the upper left arm.   Lymphadenopathy:     Head:     Right side of head: No submental, submandibular, preauricular or posterior auricular adenopathy.     Left side of head: No submental, submandibular, preauricular or posterior auricular adenopathy.     Cervical: No cervical adenopathy.     Upper Body:     Right upper body: No supraclavicular, axillary or pectoral adenopathy.     Left upper body: No supraclavicular, axillary or pectoral adenopathy.     Lower Body: No right inguinal adenopathy. No left inguinal adenopathy.  Skin:    General: Skin is warm and dry.     Findings: No erythema, lesion or rash.  Neurological:     Mental Status: He is alert and oriented to person, place, and time.  Psychiatric:        Mood and Affect: Mood normal.        Behavior: Behavior normal.         Latest Ref  Rng & Units 11/14/2022    9:42 AM  CMP  Glucose 70 - 99 mg/dL 103   BUN 8 - 23 mg/dL 16   Creatinine 0.61 - 1.24 mg/dL 1.01   Sodium 135 - 145 mmol/L 139   Potassium 3.5 - 5.1 mmol/L 3.7   Chloride 98 - 111 mmol/L 108   CO2 22 - 32 mmol/L 23   Calcium 8.9 - 10.3 mg/dL 9.0   Total Protein 6.5 - 8.1 g/dL 6.1  Total Bilirubin 0.3 - 1.2 mg/dL 0.7   Alkaline Phos 38 - 126 U/L 46   AST 15 - 41 U/L 21   ALT 0 - 44 U/L 16       Latest Ref Rng & Units 11/14/2022    9:42 AM  CBC  WBC 4.0 - 10.5 K/uL 4.9   Hemoglobin 13.0 - 17.0 g/dL 14.2   Hematocrit 39.0 - 52.0 % 42.6   Platelets 150 - 400 K/uL 205     No images are attached to the encounter.  No results found.  Assessment and plan- Patient is a 67 y.o. male with history of metastatic melanoma who presents to symptom management clinic for left upper arm edema and aching. Nonfocal exam. No lymphadenopathy noted. Discussed with Dr Rogue Bussing who also evaluated patient. No findings to warrant imaging at this time. He did have negative imaging in February which is reassuring. Recommend monitoring symptoms for now. Can travel as planned. If he has ongoing symptoms, we can get him scheduled with Gwenette Greet for evaluation of possible lymphedema.   RTC as needed if symptoms don't improve or worsen- la   Visit Diagnosis 1. Myalgia   2. Arthralgia of left elbow     Patient expressed understanding and was in agreement with this plan. He also understands that He can call clinic at any time with any questions, concerns, or complaints.   Thank you for allowing me to participate in the care of this very pleasant patient.   Beckey Rutter, DNP, AGNP-C, [facility]  CC:

## 2022-12-20 ENCOUNTER — Encounter: Payer: Self-pay | Admitting: Internal Medicine

## 2022-12-20 DIAGNOSIS — R69 Illness, unspecified: Secondary | ICD-10-CM | POA: Diagnosis not present

## 2022-12-20 DIAGNOSIS — C439 Malignant melanoma of skin, unspecified: Secondary | ICD-10-CM | POA: Diagnosis not present

## 2022-12-20 DIAGNOSIS — E039 Hypothyroidism, unspecified: Secondary | ICD-10-CM | POA: Diagnosis not present

## 2022-12-20 DIAGNOSIS — E2740 Unspecified adrenocortical insufficiency: Secondary | ICD-10-CM | POA: Diagnosis not present

## 2022-12-26 ENCOUNTER — Other Ambulatory Visit: Payer: Self-pay | Admitting: Internal Medicine

## 2023-01-01 DIAGNOSIS — E039 Hypothyroidism, unspecified: Secondary | ICD-10-CM | POA: Diagnosis not present

## 2023-01-01 DIAGNOSIS — E2740 Unspecified adrenocortical insufficiency: Secondary | ICD-10-CM | POA: Diagnosis not present

## 2023-01-01 DIAGNOSIS — C439 Malignant melanoma of skin, unspecified: Secondary | ICD-10-CM | POA: Diagnosis not present

## 2023-01-01 DIAGNOSIS — Z7952 Long term (current) use of systemic steroids: Secondary | ICD-10-CM | POA: Diagnosis not present

## 2023-01-08 DIAGNOSIS — M62830 Muscle spasm of back: Secondary | ICD-10-CM | POA: Diagnosis not present

## 2023-01-08 DIAGNOSIS — M5033 Other cervical disc degeneration, cervicothoracic region: Secondary | ICD-10-CM | POA: Diagnosis not present

## 2023-01-08 DIAGNOSIS — M9901 Segmental and somatic dysfunction of cervical region: Secondary | ICD-10-CM | POA: Diagnosis not present

## 2023-01-08 DIAGNOSIS — M9903 Segmental and somatic dysfunction of lumbar region: Secondary | ICD-10-CM | POA: Diagnosis not present

## 2023-01-13 ENCOUNTER — Ambulatory Visit: Payer: Medicare HMO | Admitting: Dermatology

## 2023-01-13 ENCOUNTER — Encounter: Payer: Self-pay | Admitting: Dermatology

## 2023-01-13 VITALS — BP 155/80 | HR 64

## 2023-01-13 DIAGNOSIS — L820 Inflamed seborrheic keratosis: Secondary | ICD-10-CM

## 2023-01-13 DIAGNOSIS — Z85828 Personal history of other malignant neoplasm of skin: Secondary | ICD-10-CM

## 2023-01-13 DIAGNOSIS — L570 Actinic keratosis: Secondary | ICD-10-CM

## 2023-01-13 DIAGNOSIS — L814 Other melanin hyperpigmentation: Secondary | ICD-10-CM | POA: Diagnosis not present

## 2023-01-13 DIAGNOSIS — D229 Melanocytic nevi, unspecified: Secondary | ICD-10-CM

## 2023-01-13 DIAGNOSIS — C4359 Malignant melanoma of other part of trunk: Secondary | ICD-10-CM | POA: Diagnosis not present

## 2023-01-13 DIAGNOSIS — Z86018 Personal history of other benign neoplasm: Secondary | ICD-10-CM

## 2023-01-13 DIAGNOSIS — Z85820 Personal history of malignant melanoma of skin: Secondary | ICD-10-CM | POA: Diagnosis not present

## 2023-01-13 DIAGNOSIS — Z1283 Encounter for screening for malignant neoplasm of skin: Secondary | ICD-10-CM

## 2023-01-13 DIAGNOSIS — L578 Other skin changes due to chronic exposure to nonionizing radiation: Secondary | ICD-10-CM

## 2023-01-13 DIAGNOSIS — C439 Malignant melanoma of skin, unspecified: Secondary | ICD-10-CM

## 2023-01-13 NOTE — Patient Instructions (Signed)
Cryotherapy Aftercare  Wash gently with soap and water everyday.   Apply Vaseline and Band-Aid daily until healed.   Recommend daily broad spectrum sunscreen SPF 30+ to sun-exposed areas, reapply every 2 hours as needed. Call for new or changing lesions.  Staying in the shade or wearing long sleeves, sun glasses (UVA+UVB protection) and wide brim hats (4-inch brim around the entire circumference of the hat) are also recommended for sun protection.    Melanoma ABCDEs  Melanoma is the most dangerous type of skin cancer, and is the leading cause of death from skin disease.  You are more likely to develop melanoma if you: Have light-colored skin, light-colored eyes, or red or blond hair Spend a lot of time in the sun Tan regularly, either outdoors or in a tanning bed Have had blistering sunburns, especially during childhood Have a close family member who has had a melanoma Have atypical moles or large birthmarks  Early detection of melanoma is key since treatment is typically straightforward and cure rates are extremely high if we catch it early.   The first sign of melanoma is often a change in a mole or a new dark spot.  The ABCDE system is a way of remembering the signs of melanoma.  A for asymmetry:  The two halves do not match. B for border:  The edges of the growth are irregular. C for color:  A mixture of colors are present instead of an even brown color. D for diameter:  Melanomas are usually (but not always) greater than 6mm - the size of a pencil eraser. E for evolution:  The spot keeps changing in size, shape, and color.  Please check your skin once per month between visits. You can use a small mirror in front and a large mirror behind you to keep an eye on the back side or your body.   If you see any new or changing lesions before your next follow-up, please call to schedule a visit.  Please continue daily skin protection including broad spectrum sunscreen SPF 30+ to  sun-exposed areas, reapplying every 2 hours as needed when you're outdoors.   Staying in the shade or wearing long sleeves, sun glasses (UVA+UVB protection) and wide brim hats (4-inch brim around the entire circumference of the hat) are also recommended for sun protection.     Due to recent changes in healthcare laws, you may see results of your pathology and/or laboratory studies on MyChart before the doctors have had a chance to review them. We understand that in some cases there may be results that are confusing or concerning to you. Please understand that not all results are received at the same time and often the doctors may need to interpret multiple results in order to provide you with the best plan of care or course of treatment. Therefore, we ask that you please give us 2 business days to thoroughly review all your results before contacting the office for clarification. Should we see a critical lab result, you will be contacted sooner.   If You Need Anything After Your Visit  If you have any questions or concerns for your doctor, please call our main line at [phone] and press option 4 to reach your doctor's medical assistant. If no one answers, please leave a voicemail as directed and we will return your call as soon as possible. Messages left after 4 pm will be answered the following business day.   You may also send us a message via   MyChart. We typically respond to MyChart messages within 1-2 business days.  For prescription refills, please ask your pharmacy to contact our office. Our fax number is [phone].  If you have an urgent issue when the clinic is closed that cannot wait until the next business day, you can page your doctor at the number below.    Please note that while we do our best to be available for urgent issues outside of office hours, we are not available 24/7.   If you have an urgent issue and are unable to reach us, you may choose to seek medical care at your  doctor's office, retail clinic, urgent care center, or emergency room.  If you have a medical emergency, please immediately call 911 or go to the emergency department.  Pager Numbers  - Dr. Kowalski: [phone]  - Dr. Moye: [phone]  - Dr. Stewart: [phone]  In the event of inclement weather, please call our main line at [phone] for an update on the status of any delays or closures.  Dermatology Medication Tips: Please keep the boxes that topical medications come in in order to help keep track of the instructions about where and how to use these. Pharmacies typically print the medication instructions only on the boxes and not directly on the medication tubes.   If your medication is too expensive, please contact our office at [phone] option 4 or send us a message through MyChart.   We are unable to tell what your co-pay for medications will be in advance as this is different depending on your insurance coverage. However, we may be able to find a substitute medication at lower cost or fill out paperwork to get insurance to cover a needed medication.   If a prior authorization is required to get your medication covered by your insurance company, please allow us 1-2 business days to complete this process.  Drug prices often vary depending on where the prescription is filled and some pharmacies may offer cheaper prices.  The website www.goodrx.com contains coupons for medications through different pharmacies. The prices here do not account for what the cost may be with help from insurance (it may be cheaper with your insurance), but the website can give you the price if you did not use any insurance.  - You can print the associated coupon and take it with your prescription to the pharmacy.  - You may also stop by our office during regular business hours and pick up a GoodRx coupon card.  - If you need your prescription sent electronically to a different pharmacy, notify  our office through [facility] MyChart or by phone at [phone] option 4.     Si Usted Necesita Algo Despus de Su Visita  Tambin puede enviarnos un mensaje a travs de MyChart. Por lo general respondemos a los mensajes de MyChart en el transcurso de 1 a 2 das hbiles.  Para renovar recetas, por favor pida a su farmacia que se ponga en contacto con nuestra oficina. Nuestro nmero de fax es el [phone].  Si tiene un asunto urgente cuando la clnica est cerrada y que no puede esperar hasta el siguiente da hbil, puede llamar/localizar a su doctor(a) al nmero que aparece a continuacin.   Por favor, tenga en cuenta que aunque hacemos todo lo posible para estar disponibles para asuntos urgentes fuera del horario de oficina, no estamos disponibles las 24 horas del da, los 7 das de la semana.   Si tiene un problema urgente y no   puede comunicarse con nosotros, puede optar por buscar atencin mdica  en el consultorio de su doctor(a), en una clnica privada, en un centro de atencin urgente o en una sala de emergencias.  Si tiene una emergencia mdica, por favor llame inmediatamente al 911 o vaya a la sala de emergencias.  Nmeros de bper  - Dr. Kowalski: [phone]  - Dra. Moye: [phone]  - Dra. Stewart: [phone]  En caso de inclemencias del tiempo, por favor llame a nuestra lnea principal al [phone] para una actualizacin sobre el estado de cualquier retraso o cierre.  Consejos para la medicacin en dermatologa: Por favor, guarde las cajas en las que vienen los medicamentos de uso tpico para ayudarle a seguir las instrucciones sobre dnde y cmo usarlos. Las farmacias generalmente imprimen las instrucciones del medicamento slo en las cajas y no directamente en los tubos del medicamento.   Si su medicamento es muy caro, por favor, pngase en contacto con nuestra oficina llamando al [phone] y presione la opcin 4 o envenos un mensaje a travs de  MyChart.   No podemos decirle cul ser su copago por los medicamentos por adelantado ya que esto es diferente dependiendo de la cobertura de su seguro. Sin embargo, es posible que podamos encontrar un medicamento sustituto a menor costo o llenar un formulario para que el seguro cubra el medicamento que se considera necesario.   Si se requiere una autorizacin previa para que su compaa de seguros cubra su medicamento, por favor permtanos de 1 a 2 das hbiles para completar este proceso.  Los precios de los medicamentos varan con frecuencia dependiendo del lugar de dnde se surte la receta y alguna farmacias pueden ofrecer precios ms baratos.  El sitio web www.goodrx.com tiene cupones para medicamentos de diferentes farmacias. Los precios aqu no tienen en cuenta lo que podra costar con la ayuda del seguro (puede ser ms barato con su seguro), pero el sitio web puede darle el precio si no utiliz ningn seguro.  - Puede imprimir el cupn correspondiente y llevarlo con su receta a la farmacia.  - Tambin puede pasar por nuestra oficina durante el horario de atencin regular y recoger una tarjeta de cupones de GoodRx.  - Si necesita que su receta se enve electrnicamente a una farmacia diferente, informe a nuestra oficina a travs de MyChart de [place] o por telfono llamando al [phone] y presione la opcin 4.  

## 2023-01-13 NOTE — Progress Notes (Signed)
Follow-Up Visit   Subjective  [name] is a 67 y.o. male who presents for the following: Skin Cancer Screening and Full Body Skin Exam  The patient presents for Total-Body Skin Exam (TBSE) for skin cancer screening and mole check. The patient has spots, moles and lesions to be evaluated, some may be new or changing and the patient has concerns that these could be cancer.    The following portions of the chart were reviewed this encounter and updated as appropriate: medications, allergies, medical history  Review of Systems:  No other skin or systemic complaints except as noted in HPI or Assessment and Plan.  Objective  Well appearing patient in no apparent distress; mood and affect are within normal limits.  A full examination was performed including scalp, head, eyes, ears, nose, lips, neck, chest, axillae, abdomen, back, buttocks, bilateral upper extremities, bilateral lower extremities, hands, feet, fingers, toes, fingernails, and toenails. All findings within normal limits unless otherwise noted below.   Relevant physical exam findings are noted in the Assessment and Plan.    Assessment & Plan   Metastatic Melanoma Left lat infraclavicular History of Melanoma - Left lateral infraclavicular in February 2015; S/P Wide Local excision - clear with close follow up for past 7 years.  Patient met all recommended treatment protocol and follow up. Metastatic Melanoma of left axilla diagnosed 2022 - he was being treated with Rande Lawman last treatment was January. Skin is Clear today.  No lymphadenopathy.  Observe for recurrence. Call clinic for new or changing lesions.  Recommend regular skin exams, Haynes broad-spectrum spf 30+ sunscreen use, and photoprotection.   Follow-up with oncology.  Metastatic melanoma (HCC) Left Axilla Axillary excision per Dr Birdie Sons. All scans after finishing Keytruda have been clear. MRI of brain is clear.  CT of Chest; abdomen and pelvis is clear       History of Basal Cell Carcinoma of the Skin. Right medial forehead. 2014, excised.  - No evidence of recurrence today - Recommend regular full body skin exams - Recommend Haynes broad spectrum sunscreen SPF 30+ to sun-exposed areas, reapply every 2 hours as needed.  - Call if any new or changing lesions are noted between office visits   History of Dysplastic Nevi. Multiple sites, see history. - No evidence of recurrence today - Recommend regular full body skin exams - Recommend Haynes broad spectrum sunscreen SPF 30+ to sun-exposed areas, reapply every 2 hours as needed.  - Call if any new or changing lesions are noted between office visits  LENTIGINES, SEBORRHEIC KERATOSES, HEMANGIOMAS - Benign normal skin lesions - Benign-appearing - Call for any changes  MELANOCYTIC NEVI - Tan-brown and/or pink-flesh-colored symmetric macules and papules - Benign appearing on exam today - Observation - Call clinic for new or changing moles - Recommend Haynes use of broad spectrum spf 30+ sunscreen to sun-exposed areas.   ACTINIC DAMAGE - Chronic condition, secondary to cumulative UV/sun exposure - diffuse scaly erythematous macules with underlying dyspigmentation - Recommend Haynes broad spectrum sunscreen SPF 30+ to sun-exposed areas, reapply every 2 hours as needed.  - Staying in the shade or wearing long sleeves, sun glasses (UVA+UVB protection) and wide brim hats (4-inch brim around the entire circumference of the hat) are also recommended for sun protection.  - Call for new or changing lesions.  SKIN CANCER SCREENING PERFORMED TODAY.  INFLAMED SEBORRHEIC KERATOSIS Exam: Erythematous keratotic or waxy stuck-on papule or plaque.  Symptomatic, irritating, patient would like treated.  Benign-appearing.  Call clinic  for new or changing lesions.   Prior to procedure, discussed risks of blister formation, small wound, skin dyspigmentation, or rare scar following treatment. Recommend  Vaseline ointment to treated areas while healing.  Destruction Procedure Note Destruction method: cryotherapy   Informed consent: discussed and consent obtained   Lesion destroyed using liquid nitrogen: Yes   Outcome: patient tolerated procedure well with no complications   Post-procedure details: wound care instructions given   Locations: right thigh x1 # of Lesions Treated: 1  ACTINIC KERATOSIS Exam: Erythematous thin papules/macules with gritty scale  Actinic keratoses are precancerous spots that appear secondary to cumulative UV radiation exposure/sun exposure over time. They are chronic with expected duration over 1 year. A portion of actinic keratoses will progress to squamous cell carcinoma of the skin. It is not possible to reliably predict which spots will progress to skin cancer and so treatment is recommended to prevent development of skin cancer.  Recommend Haynes broad spectrum sunscreen SPF 30+ to sun-exposed areas, reapply every 2 hours as needed.  Recommend staying in the shade or wearing long sleeves, sun glasses (UVA+UVB protection) and wide brim hats (4-inch brim around the entire circumference of the hat). Call for new or changing lesions.  Treatment Plan:  Prior to procedure, discussed risks of blister formation, small wound, skin dyspigmentation, or rare scar following cryotherapy. Recommend Vaseline ointment to treated areas while healing.  Destruction Procedure Note Destruction method: cryotherapy   Informed consent: discussed and consent obtained   Lesion destroyed using liquid nitrogen: Yes   Outcome: patient tolerated procedure well with no complications   Post-procedure details: wound care instructions given   Locations: scalp x9, left temple x2 # of Lesions Treated: 11   HEMANGIOMA Exam: red papule at left thigh. Discussed benign nature. Recommend observation. Call for changes.   Return in about 4 months (around 05/15/2023) for TBSE, MM Follow Up.  I,  Lawson Radar, CMA, am acting as scribe for Armida Sans, MD.   Documentation: I have reviewed the above documentation for accuracy and completeness, and I agree with the above.  Armida Sans, MD

## 2023-01-14 ENCOUNTER — Other Ambulatory Visit: Payer: Self-pay

## 2023-01-16 ENCOUNTER — Ambulatory Visit: Payer: Medicare HMO | Admitting: Dermatology

## 2023-01-17 DIAGNOSIS — R69 Illness, unspecified: Secondary | ICD-10-CM | POA: Diagnosis not present

## 2023-01-22 ENCOUNTER — Encounter: Payer: Self-pay | Admitting: Dermatology

## 2023-02-05 DIAGNOSIS — M5033 Other cervical disc degeneration, cervicothoracic region: Secondary | ICD-10-CM | POA: Diagnosis not present

## 2023-02-05 DIAGNOSIS — M9901 Segmental and somatic dysfunction of cervical region: Secondary | ICD-10-CM | POA: Diagnosis not present

## 2023-02-05 DIAGNOSIS — M9903 Segmental and somatic dysfunction of lumbar region: Secondary | ICD-10-CM | POA: Diagnosis not present

## 2023-02-05 DIAGNOSIS — M62830 Muscle spasm of back: Secondary | ICD-10-CM | POA: Diagnosis not present

## 2023-02-11 ENCOUNTER — Ambulatory Visit
Admission: RE | Admit: 2023-02-11 | Discharge: 2023-02-11 | Disposition: A | Discharge status: Home / Self Care | Payer: Medicare HMO | Source: Ambulatory Visit | Attending: Internal Medicine | Admitting: Internal Medicine

## 2023-02-11 DIAGNOSIS — C779 Secondary and unspecified malignant neoplasm of lymph node, unspecified: Secondary | ICD-10-CM | POA: Insufficient documentation

## 2023-02-11 DIAGNOSIS — I700 Atherosclerosis of aorta: Secondary | ICD-10-CM | POA: Diagnosis not present

## 2023-02-11 DIAGNOSIS — K5730 Diverticulosis of large intestine without perforation or abscess without bleeding: Secondary | ICD-10-CM | POA: Diagnosis not present

## 2023-02-11 MED ORDER — IOHEXOL 300 MG/ML  SOLN
85.0000 mL | Freq: Once | INTRAMUSCULAR | Status: AC | PRN
  Administered 2023-02-11: 85 mL via INTRAVENOUS

## 2023-02-17 ENCOUNTER — Inpatient Hospital Stay: Payer: Medicare HMO | Admitting: Internal Medicine

## 2023-02-17 ENCOUNTER — Telehealth: Payer: Self-pay | Admitting: *Deleted

## 2023-02-17 ENCOUNTER — Inpatient Hospital Stay: Payer: Medicare HMO | Attending: Oncology

## 2023-02-17 ENCOUNTER — Encounter: Payer: Self-pay | Admitting: Internal Medicine

## 2023-02-17 VITALS — BP 157/93 | HR 58 | Temp 95.9°F | Ht 73.0 in | Wt 193.6 lb

## 2023-02-17 DIAGNOSIS — C779 Secondary and unspecified malignant neoplasm of lymph node, unspecified: Secondary | ICD-10-CM | POA: Diagnosis not present

## 2023-02-17 DIAGNOSIS — K5730 Diverticulosis of large intestine without perforation or abscess without bleeding: Secondary | ICD-10-CM | POA: Insufficient documentation

## 2023-02-17 DIAGNOSIS — E039 Hypothyroidism, unspecified: Secondary | ICD-10-CM | POA: Insufficient documentation

## 2023-02-17 DIAGNOSIS — C4359 Malignant melanoma of other part of trunk: Secondary | ICD-10-CM | POA: Diagnosis not present

## 2023-02-17 DIAGNOSIS — Z79899 Other long term (current) drug therapy: Secondary | ICD-10-CM | POA: Diagnosis not present

## 2023-02-17 DIAGNOSIS — I10 Essential (primary) hypertension: Secondary | ICD-10-CM | POA: Insufficient documentation

## 2023-02-17 DIAGNOSIS — C773 Secondary and unspecified malignant neoplasm of axilla and upper limb lymph nodes: Secondary | ICD-10-CM | POA: Diagnosis not present

## 2023-02-17 DIAGNOSIS — R079 Chest pain, unspecified: Secondary | ICD-10-CM | POA: Diagnosis not present

## 2023-02-17 DIAGNOSIS — H43399 Other vitreous opacities, unspecified eye: Secondary | ICD-10-CM | POA: Diagnosis not present

## 2023-02-17 DIAGNOSIS — I700 Atherosclerosis of aorta: Secondary | ICD-10-CM | POA: Diagnosis not present

## 2023-02-17 DIAGNOSIS — Z801 Family history of malignant neoplasm of trachea, bronchus and lung: Secondary | ICD-10-CM | POA: Insufficient documentation

## 2023-02-17 DIAGNOSIS — R0789 Other chest pain: Secondary | ICD-10-CM | POA: Diagnosis not present

## 2023-02-17 DIAGNOSIS — E2740 Unspecified adrenocortical insufficiency: Secondary | ICD-10-CM | POA: Diagnosis not present

## 2023-02-17 LAB — CBC WITH DIFFERENTIAL/PLATELET
Abs Immature Granulocytes: 0.02 10*3/uL (ref 0.00–0.07)
Basophils Absolute: 0.1 10*3/uL (ref 0.0–0.1)
Basophils Relative: 1 %
Eosinophils Absolute: 0.2 10*3/uL (ref 0.0–0.5)
Eosinophils Relative: 4 %
HCT: 45.1 % (ref 39.0–52.0)
Hemoglobin: 14.8 g/dL (ref 13.0–17.0)
Immature Granulocytes: 0 %
Lymphocytes Relative: 49 %
Lymphs Abs: 2.5 10*3/uL (ref 0.7–4.0)
MCH: 31.1 pg (ref 26.0–34.0)
MCHC: 32.8 g/dL (ref 30.0–36.0)
MCV: 94.7 fL (ref 80.0–100.0)
Monocytes Absolute: 0.5 10*3/uL (ref 0.1–1.0)
Monocytes Relative: 9 %
Neutro Abs: 1.9 10*3/uL (ref 1.7–7.7)
Neutrophils Relative %: 37 %
Platelets: 204 10*3/uL (ref 150–400)
RBC: 4.76 MIL/uL (ref 4.22–5.81)
RDW: 13.2 % (ref 11.5–15.5)
WBC: 5.1 10*3/uL (ref 4.0–10.5)
nRBC: 0 % (ref 0.0–0.2)

## 2023-02-17 LAB — COMPREHENSIVE METABOLIC PANEL
ALT: 17 U/L (ref 0–44)
AST: 23 U/L (ref 15–41)
Albumin: 3.8 g/dL (ref 3.5–5.0)
Alkaline Phosphatase: 44 U/L (ref 38–126)
Anion gap: 10 (ref 5–15)
BUN: 19 mg/dL (ref 8–23)
CO2: 26 mmol/L (ref 22–32)
Calcium: 9.1 mg/dL (ref 8.9–10.3)
Chloride: 105 mmol/L (ref 98–111)
Creatinine, Ser: 1.26 mg/dL — ABNORMAL HIGH (ref 0.61–1.24)
GFR, Estimated: >60 mL/min (ref >60)
Glucose, Bld: 92 mg/dL (ref 70–99)
Potassium: 4.2 mmol/L (ref 3.5–5.1)
Sodium: 141 mmol/L (ref 135–145)
Total Bilirubin: 0.5 mg/dL (ref 0.3–1.2)
Total Protein: 6.1 g/dL — ABNORMAL LOW (ref 6.5–8.1)

## 2023-02-17 NOTE — Progress Notes (Signed)
C/o chest tightness off and on. Not related to exertion.  C/o floaters in vision, feels like there's a film that comes and goes x2 weeks ago.  C/o tingling and numbness in fingers, comes and goes. New since last visit.  Needs advice on how to take meds daily/time of day, levothyroxine and pantoprazole.   C/o BP running higher than normal.

## 2023-02-17 NOTE — Telephone Encounter (Signed)
Referral to [facility] cardiology/ Dr. Juliann Pares placed in epic. Referral documentation faxed to Cardiology office.

## 2023-02-17 NOTE — Progress Notes (Signed)
[facility] OFFICE PROGRESS NOTE  Patient Care Team: Bosie Clos, MD as PCP - General (Family Medicine) Earna Coder, MD as Consulting Physician (Oncology)   Cancer Staging  Malignant melanoma metastatic to lymph node [facility]) Staging form: Melanoma of the Skin, AJCC 8th Edition - Clinical: Stage III (cTX, [name], cM0) - Signed by Earna Coder, MD on 08/13/2021   Oncology History Overview Note  #  2015- left collar bone [Dr.Kowalski]- s/p resection- 0.65 mm  #  A. 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   There is sufficient material for ancillary molecular testing if needed  (block A3, A1, A2).   Comment:  Per provided outside pathology report the patient had an "at least pT1b"  malignant melanoma of the left lateral infraclavicular skin in 2015.  Touch preparations demonstrate predominantly discohesive large  pleomorphic cells, some with binucleation.  HE sections demonstrate  lymph node parenchyma involved by metastatic neoplasm.  The neoplastic  cells are pleomorphic with binucleation, focal intranuclear inclusions,  and associated tumor necrosis. The neoplastic cells demonstrate the  following pattern of immunoreactivity:  S100: Positive  SOX10: Positive  HMB-45: Negative  Melan-A: Negative  Super pancytokeratin: Negative  CD45: Negative  This pattern of immunoreactivity supports the above diagnosis.   # NOV 7th, 2022-neoadjuvant Keytruda cycle #1  # [id number], 26th, [name],  majority of these foci measure 1-2 mm in greatest dimension, with the largest measuring 4 mm.  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  Accompanied by his wife.   [name] 67 y.o.  male pleasant patient above history of recurrent melanoma left axillary lymphadenopathy-s/p surgery is currently status post adjuvant Keytruda-with iatrogenic hypothyroidism /adrenal insufficiency is here for follow-up/to review the results of his baseline posttreatment CAT scan.  C/o chest tightness off and on. Not related to exertion.   C/o floaters in vision, feels like there's a film that comes and goes x2 weeks ago.   C/o tingling and numbness in fingers, comes and goes. New since last visit.   Needs advice on how to take meds daily/time of day, levothyroxine and pantoprazole.    Patient denies any shortness of breath or cough.  No fever no chills.  No skin rash.  Review of Systems  Constitutional:  Negative for chills, diaphoresis, fever, malaise/fatigue and weight loss.  HENT:  Negative for nosebleeds and sore throat.   Eyes:  Negative for double vision.  Respiratory:  Negative for hemoptysis, sputum production, shortness of breath and wheezing.   Cardiovascular:  Negative for chest pain, palpitations, orthopnea and leg swelling.  Gastrointestinal:  Negative for abdominal pain, blood in stool, constipation, diarrhea, heartburn, melena, nausea and vomiting.  Genitourinary:  Negative for dysuria, frequency and  urgency.  Musculoskeletal:  Positive for myalgias. Negative for back pain and joint pain.  Skin: Negative.  Negative for itching and rash.  Neurological:  Positive for tingling. Negative for dizziness, focal weakness and weakness.  Endo/Heme/Allergies:  Does not bruise/bleed easily.  Psychiatric/Behavioral:  Negative for depression. The patient is not nervous/anxious and does not have insomnia.       PAST MEDICAL HISTORY :  Past Medical History:  Diagnosis Date   Basal cell carcinoma 03/10/2013   Right medial forehead. Excised, margins free.   Dysplastic nevus 11/05/2006   Mid back paraspinal, [name], close to edge. Excised 12/24/2006, margins free.   Dysplastic nevus 12/15/2008   Left lat. pectoral area. Moderate atypia, extends to one edge.    Dysplastic nevus 12/07/2009   Left mid side. Moderate atypia, close to margin.   Dysplastic nevus 12/07/2009   Right posterior waistline. Moderate atypia, close to margin.   Dysplastic nevus 02/10/2014   Left paraspinal mid back. Moderate atypia, lateral and deep margin involved.    Dysplastic nevus 06/16/2014   Left epigastric. Mild atypia, deep margin involved.    GERD (gastroesophageal reflux disease)    Hx of basal cell carcinoma    multiple sites   Hx of malignant melanoma 11/11/2013   L lateral infraclavicular, superficial spreading, Breslow's 0.85mm, Clark's level III   Hypothyroidism    Melanoma (HCC) 11/11/2013   Left lateral infraclavicular. MM, superficial spreading. Anatomic level III. Tumor thickness 0.67mm. Excised 11/24/2013, margins free.    Metastatic melanoma (HCC) 07/2021   L axilla, excised by Dr. Birdie Sons, followd by Dr. Racheal Patches with Rande Lawman    PAST SURGICAL HISTORY :   Past Surgical History:  Procedure Laterality Date   AXILLARY LYMPH NODE DISSECTION Left 11/02/2021   Procedure: AXILLARY LYMPH NODE DISSECTION;  Surgeon: Earline Mayotte, MD;  [name];  Service:  General;  Laterality: Left;   COLONOSCOPY WITH PROPOFOL N/A 01/20/2017   Procedure: COLONOSCOPY WITH PROPOFOL;  Surgeon: Christena Deem, MD;  Location: [facility] ENDOSCOPY;  Service: Endoscopy;  [name];   ESOPHAGOGASTRODUODENOSCOPY (EGD) WITH ESOPHAGEAL DILATION     MELANOMA EXCISION Right 11/24/2013   lateral infraclavicular   WISDOM TOOTH EXTRACTION     WRIST SURGERY Left 2022    FAMILY HISTORY :   Family History  Problem Relation Age of Onset   Macular degeneration Mother    Hyperlipidemia Mother    Heart disease Father    Heart attack Father    Lung cancer Father        metastasized    SOCIAL HISTORY:   Social History   Tobacco Use   Smoking status: Never   Smokeless tobacco: Never  Vaping Use   Vaping Use: Never used  Substance Use Topics   Alcohol use: Yes    Comment: about 1-3 glass of wine with dinner   Drug use: No    ALLERGIES:  has No Known Allergies.  MEDICATIONS:  Current Outpatient Medications  Medication Sig Dispense Refill   aspirin 81 MG tablet Take 81 mg by mouth daily.     hydrocortisone (CORTEF) 10 MG tablet TAKE 2 PILLS IN THE MORNING AND 1 PILL AT NIGHT. DO NOT STOP UNTIL DIRECTED. TAKE WITH FOOD. 90 tablet 1   Multiple Vitamins tablet Take 1 tablet by mouth daily.  Multiple Vitamins-Minerals (PRESERVISION AREDS 2 PO) Take 1 capsule by mouth in the morning and at bedtime.     pantoprazole (PROTONIX) 20 MG tablet Take 20 mg by mouth daily.  0   Pembrolizumab (KEYTRUDA IV) Inject 1 Dose into the vein every 21 ( twenty-one) days.     levothyroxine (SYNTHROID) 112 MCG tablet Take 112 mcg by mouth daily before breakfast.     No current facility-administered medications for this visit.    PHYSICAL EXAMINATION: ECOG PERFORMANCE STATUS: 0 - Asymptomatic  BP (!) 157/93 (BP Location: Right Arm, Patient Position: Sitting, Cuff Size: Normal)   Pulse (!) 58   Temp (!) 95.9 F (35.5 C) (Tympanic)   Ht 6\' 1"  (1.854 m)   Wt 193 lb 9.6 oz  (87.8 kg)   SpO2 100%   BMI 25.54 kg/m   Filed Weights   02/17/23 1017  Weight: 193 lb 9.6 oz (87.8 kg)    Physical Exam Vitals and nursing note reviewed.  HENT:     Head: Normocephalic and atraumatic.     Mouth/Throat:     Pharynx: Oropharynx is clear.  Eyes:     Extraocular Movements: Extraocular movements intact.     Pupils: Pupils are equal, round, and reactive to light.  Cardiovascular:     Rate and Rhythm: Normal rate and regular rhythm.  Pulmonary:     Comments: Decreased breath sounds bilaterally.  Abdominal:     Palpations: Abdomen is soft.  Musculoskeletal:        General: Normal range of motion.     Cervical back: Normal range of motion.  Skin:    General: Skin is warm.  Neurological:     General: No focal deficit present.     Mental Status: He is alert and oriented to person, place, and time.  Psychiatric:        Behavior: Behavior normal.        Judgment: Judgment normal.        LABORATORY DATA:  I have reviewed the data as listed    Component Value Date/Time   NA 141 02/17/2023 1023   NA 144 05/16/2022 0953   K 4.2 02/17/2023 1023   CL 105 02/17/2023 1023   CO2 26 02/17/2023 1023   GLUCOSE 92 02/17/2023 1023   BUN 19 02/17/2023 1023   BUN 12 05/16/2022 0953   CREATININE 1.26 (H) 02/17/2023 1023   CALCIUM 9.1 02/17/2023 1023   PROT 6.1 (L) 02/17/2023 1023   PROT 5.7 (L) 05/16/2022 0953   ALBUMIN 3.8 02/17/2023 1023   ALBUMIN 3.9 05/16/2022 0953   AST 23 02/17/2023 1023   ALT 17 02/17/2023 1023   ALKPHOS 44 02/17/2023 1023   BILITOT 0.5 02/17/2023 1023   BILITOT 0.5 05/16/2022 0953   GFRNONAA >60 02/17/2023 1023   GFRAA 75 05/10/2020 1017    No results found for: "SPEP", "UPEP"  Lab Results  Component Value Date   WBC 5.1 02/17/2023   NEUTROABS 1.9 02/17/2023   HGB 14.8 02/17/2023   HCT 45.1 02/17/2023   MCV 94.7 02/17/2023   PLT 204 02/17/2023      Chemistry      Component Value Date/Time   NA 141 02/17/2023 1023   NA  144 05/16/2022 0953   K 4.2 02/17/2023 1023   CL 105 02/17/2023 1023   CO2 26 02/17/2023 1023   BUN 19 02/17/2023 1023   BUN 12 05/16/2022 0953   CREATININE 1.26 (H) 02/17/2023 1023   GLU 96 10/19/2014  0000      Component Value Date/Time   CALCIUM 9.1 02/17/2023 1023   ALKPHOS 44 02/17/2023 1023   AST 23 02/17/2023 1023   ALT 17 02/17/2023 1023   BILITOT 0.5 02/17/2023 1023   BILITOT 0.5 05/16/2022 0953       RADIOGRAPHIC STUDIES: I have personally reviewed the radiological images as listed and agreed with the findings in the report. No results found.   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  S/p neoadjuvant Keytruda x4- -Status post axillary lymph node dissection-with residual micrometastatic disease.  Patient currently s/p  adjuvant Keytruda x18 cycles.   Tolerated with mild to moderate difficulties see belowI. MAY 7th, 2024- Stable examination without convincing evidence of metastatic disease within the chest, abdomen, or pelvis.  # slightly elevated BP- 150/80s- [? Cortef; no weight gain] recommend monitoring for now. Stable.   # chest tightness- Not worse with activity; atypical- [>>KC-cards]- refer to cardiology- [facility] re: chest pain  # floaters in vision- recommend/awaiting evaluation with opthalmology-   # Tingling and numbness in fingers- Bil UE- in AM-question carpal tunnel versus history of Dupuytren's contracture.  Monitor for now.  #Endocrinopathies: Hypothyroidism/adrenal insufficiency: [s/p evaluation- SEP 2023- Endocrinology-] iatrogenic hypothyroidism on Synthroid 112  mcg once a day; SEP 2023- TSH 0.15; Normal T3/T4-. Monitor for now.  Adrenal insufficiency -extreme fatigue--currently on hydrocortsione 20 mg in the morning and 10 mg at night. S/p Endocrinology appt. Monitor for now.   #Incidental findings on Imaging  CT, MAY  2024: Colonic diverticulosis without findings  of acute diverticulitis; Aortic Atherosclerosis;  reviewed/discussed/counseled the patient.   # DISPOSITION:   #  refer to cardiology- [facility] re: chest pain # follow up in 3 months-MD; labs- cbc/cmp;thyroid profile;  Dr.B.  # I reviewed the blood work- with the patient in detail; also reviewed the imaging independently [as summarized above]; and with the patient in detail.       Orders Placed This Encounter  Procedures   CBC with Differential (Cancer Center Only)    Standing Status:   Future    Standing Expiration Date:   02/17/2024   CMP (Cancer Center only)    Standing Status:   Future    Standing Expiration Date:   02/17/2024   Thyroid Panel With TSH    Standing Status:   Future    Standing Expiration Date:   02/17/2024   Ambulatory referral to Cardiology    Referral Priority:   Routine    Referral Type:   Consultation    Referral Reason:   Specialty Services Required    Referred to Provider:   Alwyn Pea, MD    Requested Specialty:   Cardiology    Number of Visits Requested:   1    All questions were answered. The patient knows to call the clinic with any problems, questions or concerns.      Earna Coder, MD 02/17/2023 11:34 AM

## 2023-02-17 NOTE — Assessment & Plan Note (Addendum)
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  S/p neoadjuvant Keytruda x4- -Status post axillary lymph node dissection-with residual micrometastatic disease.  Patient currently s/p  adjuvant Keytruda x18 cycles.   Tolerated with mild to moderate difficulties see belowI. MAY 7th, 2024- Stable examination without convincing evidence of metastatic disease within the chest, abdomen, or pelvis.  # slightly elevated BP- 150/80s- [? Cortef; no weight gain] recommend monitoring for now. Stable.   # chest tightness- Not worse with activity; atypical- [>>KC-cards]- refer to cardiology- [facility] re: chest pain  # floaters in vision- recommend/awaiting evaluation with opthalmology-   # Tingling and numbness in fingers- Bil UE- in AM-question carpal tunnel versus history of Dupuytren's contracture.  Monitor for now.  #Endocrinopathies: Hypothyroidism/adrenal insufficiency: [s/p evaluation- SEP 2023- Endocrinology-] iatrogenic hypothyroidism on Synthroid 112  mcg once a day; SEP 2023- TSH 0.15; Normal T3/T4-. Monitor for now.  Adrenal insufficiency -extreme fatigue--currently on hydrocortsione 20 mg in the morning and 10 mg at night. S/p Endocrinology appt. Monitor for now.   #Incidental findings on Imaging  CT, MAY  2024: Colonic diverticulosis without findings of acute diverticulitis; Aortic Atherosclerosis;  reviewed/discussed/counseled the patient.   # DISPOSITION:   #  refer to cardiology- [facility] re: chest pain # follow up in 3 months-MD; labs- cbc/cmp;thyroid profile;  Dr.B.  # I reviewed the blood work- with the patient in detail; also reviewed the imaging independently [as summarized above]; and with the patient in detail.

## 2023-02-18 LAB — THYROID PANEL WITH TSH
Free Thyroxine Index: 2.3 (ref 1.2–4.9)
T3 Uptake Ratio: 31 % (ref 24–39)
T4, Total: 7.4 ug/dL (ref 4.5–12.0)
TSH: 1.87 u[IU]/mL (ref 0.450–4.500)

## 2023-02-19 DIAGNOSIS — R69 Illness, unspecified: Secondary | ICD-10-CM | POA: Diagnosis not present

## 2023-02-21 ENCOUNTER — Other Ambulatory Visit: Payer: Self-pay | Admitting: Internal Medicine

## 2023-02-27 DIAGNOSIS — H25813 Combined forms of age-related cataract, bilateral: Secondary | ICD-10-CM | POA: Diagnosis not present

## 2023-02-27 DIAGNOSIS — H31093 Other chorioretinal scars, bilateral: Secondary | ICD-10-CM | POA: Diagnosis not present

## 2023-02-27 DIAGNOSIS — Z85820 Personal history of malignant melanoma of skin: Secondary | ICD-10-CM | POA: Diagnosis not present

## 2023-02-27 DIAGNOSIS — H43811 Vitreous degeneration, right eye: Secondary | ICD-10-CM | POA: Diagnosis not present

## 2023-02-27 DIAGNOSIS — H43391 Other vitreous opacities, right eye: Secondary | ICD-10-CM | POA: Diagnosis not present

## 2023-02-27 DIAGNOSIS — H35371 Puckering of macula, right eye: Secondary | ICD-10-CM | POA: Diagnosis not present

## 2023-02-27 DIAGNOSIS — H35413 Lattice degeneration of retina, bilateral: Secondary | ICD-10-CM | POA: Diagnosis not present

## 2023-03-03 ENCOUNTER — Encounter: Payer: Self-pay | Admitting: Internal Medicine

## 2023-03-04 ENCOUNTER — Inpatient Hospital Stay (HOSPITAL_BASED_OUTPATIENT_CLINIC_OR_DEPARTMENT_OTHER): Payer: Medicare HMO | Admitting: Hospice and Palliative Medicine

## 2023-03-04 ENCOUNTER — Telehealth: Payer: Self-pay | Admitting: *Deleted

## 2023-03-04 ENCOUNTER — Ambulatory Visit
Admission: RE | Admit: 2023-03-04 | Discharge: 2023-03-04 | Disposition: A | Discharge status: Home / Self Care | Payer: Medicare HMO | Source: Ambulatory Visit | Attending: Hospice and Palliative Medicine | Admitting: Hospice and Palliative Medicine

## 2023-03-04 ENCOUNTER — Ambulatory Visit
Admission: RE | Admit: 2023-03-04 | Discharge: 2023-03-04 | Disposition: A | Discharge status: Home / Self Care | Payer: Medicare HMO | Attending: Hospice and Palliative Medicine | Admitting: Hospice and Palliative Medicine

## 2023-03-04 ENCOUNTER — Other Ambulatory Visit: Payer: Self-pay | Admitting: *Deleted

## 2023-03-04 ENCOUNTER — Encounter: Payer: Self-pay | Admitting: Hospice and Palliative Medicine

## 2023-03-04 DIAGNOSIS — R0981 Nasal congestion: Secondary | ICD-10-CM

## 2023-03-04 DIAGNOSIS — C779 Secondary and unspecified malignant neoplasm of lymph node, unspecified: Secondary | ICD-10-CM

## 2023-03-04 DIAGNOSIS — J069 Acute upper respiratory infection, unspecified: Secondary | ICD-10-CM

## 2023-03-04 DIAGNOSIS — R059 Cough, unspecified: Secondary | ICD-10-CM | POA: Diagnosis not present

## 2023-03-04 DIAGNOSIS — E2740 Unspecified adrenocortical insufficiency: Secondary | ICD-10-CM | POA: Diagnosis not present

## 2023-03-04 DIAGNOSIS — R051 Acute cough: Secondary | ICD-10-CM | POA: Insufficient documentation

## 2023-03-04 NOTE — Progress Notes (Signed)
Virtual Visit via Video Note  I connected with [name] on 03/04/23 at 11:30 AM EDT by a video enabled telemedicine application and verified that I am speaking with the correct person using two identifiers.  Location: Patient: Home Provider: Clinic   I discussed the limitations of evaluation and management by telemedicine and the availability of in person appointments. The patient expressed understanding and agreed to proceed.  Due to technical difficulties, we switched to a telephone visit.  History of Present Illness: [name] is a 67 year old male with multiple medical problems including recurrent stage III melanoma metastatic to lymph node status post neoadjuvant Keytruda followed by axillary lymph node dissection and adjuvant Keytruda.  Patient now on surveillance.   Observations/Objective: Patient was last seen by Dr. Donneta Haynes on 02/17/2023 at which time patient was doing reasonably well without any acute changes.  Patient seen virtually today with complaint of 4 to 5 days of upper respiratory symptoms including nasal congestion, occasional nonproductive cough.  Patient reports that he had fever last week with Tmax of 100.8 but has been afebrile since.  Patient increased his dose of hydrocortisone to 50 mg from normal dose of 30 mg daily.  Patient reports that he has a sick contact from his wife who has similar symptoms.  Patient says he took a home COVID test this morning which was negative.  Assessment and Plan: Viral URI -symptoms sound viral in etiology.  Patient is nontoxic-appearing and overall he feels like he is improving.  I recommended symptomatic treatment with OTC medications, which we discussed in detail.  Adrenal insufficiency-patient on hydrocortisone and has temporarily increase dosing while sick.  Case and plan discussed with Dr. Donneta Haynes  Follow Up Instructions: As previously scheduled   I discussed the assessment and treatment plan with  the patient. The patient was provided an opportunity to ask questions and all were answered. The patient agreed with the plan and demonstrated an understanding of the instructions.   The patient was advised to call back or seek an in-person evaluation if the symptoms worsen or if the condition fails to improve as anticipated.  I provided 15 minutes of non-face-to-face time during this encounter.   [name], NP

## 2023-03-04 NOTE — Telephone Encounter (Signed)
Patient sent mychart msg with concerns of head/cold congestion/cough since last Thursday 5/23. His wife also has symptoms. Wife tested negative for covid home test. Per [name], recommendations for chest xray stat, mychart visit today to discuss concerns and have pt also take home covid test. Pt agreeable. Mychart visit will be set up at 1130 today. Pt to let us know the results of covid testing.

## 2023-03-11 DIAGNOSIS — H6123 Impacted cerumen, bilateral: Secondary | ICD-10-CM | POA: Diagnosis not present

## 2023-03-11 DIAGNOSIS — J0140 Acute pansinusitis, unspecified: Secondary | ICD-10-CM | POA: Diagnosis not present

## 2023-03-11 DIAGNOSIS — R0982 Postnasal drip: Secondary | ICD-10-CM | POA: Diagnosis not present

## 2023-03-11 DIAGNOSIS — R051 Acute cough: Secondary | ICD-10-CM | POA: Diagnosis not present

## 2023-03-12 DIAGNOSIS — M5033 Other cervical disc degeneration, cervicothoracic region: Secondary | ICD-10-CM | POA: Diagnosis not present

## 2023-03-12 DIAGNOSIS — M9901 Segmental and somatic dysfunction of cervical region: Secondary | ICD-10-CM | POA: Diagnosis not present

## 2023-03-12 DIAGNOSIS — M62830 Muscle spasm of back: Secondary | ICD-10-CM | POA: Diagnosis not present

## 2023-03-12 DIAGNOSIS — M9903 Segmental and somatic dysfunction of lumbar region: Secondary | ICD-10-CM | POA: Diagnosis not present

## 2023-03-17 DIAGNOSIS — H6122 Impacted cerumen, left ear: Secondary | ICD-10-CM | POA: Diagnosis not present

## 2023-03-20 ENCOUNTER — Telehealth: Payer: Self-pay | Admitting: *Deleted

## 2023-03-20 NOTE — Progress Notes (Signed)
  Care Coordination   Note   03/20/2023 Name: [name] MRN: [id number] DOB: 09-06-1956  [name] is a 67 y.o. year old male who sees Bosie Clos, MD for primary care. I reached out to Alm Bustard by phone today to offer care coordination services.  Mr. Beck was given information about Care Coordination services today including:   The Care Coordination services include support from the care team which includes your Nurse Coordinator, Clinical Social Worker, or Pharmacist.  The Care Coordination team is here to help remove barriers to the health concerns and goals most important to you. Care Coordination services are voluntary, and the patient may decline or stop services at any time by request to their care team member.   Care Coordination Consent Status: Patient agreed to services and verbal consent obtained.   Follow up plan:  Telephone appointment with care coordination team member scheduled for:  04/16/23  Encounter Outcome:  Pt. Scheduled  [facility] Coordination Care Guide  Direct Dial: [phone]

## 2023-03-24 DIAGNOSIS — R69 Illness, unspecified: Secondary | ICD-10-CM | POA: Diagnosis not present

## 2023-04-01 DIAGNOSIS — E039 Hypothyroidism, unspecified: Secondary | ICD-10-CM | POA: Diagnosis not present

## 2023-04-01 DIAGNOSIS — M791 Myalgia, unspecified site: Secondary | ICD-10-CM | POA: Diagnosis not present

## 2023-04-16 ENCOUNTER — Ambulatory Visit: Payer: Self-pay | Admitting: *Deleted

## 2023-04-16 ENCOUNTER — Encounter: Payer: Self-pay | Admitting: *Deleted

## 2023-04-16 DIAGNOSIS — M5033 Other cervical disc degeneration, cervicothoracic region: Secondary | ICD-10-CM | POA: Diagnosis not present

## 2023-04-16 DIAGNOSIS — M62830 Muscle spasm of back: Secondary | ICD-10-CM | POA: Diagnosis not present

## 2023-04-16 DIAGNOSIS — M9903 Segmental and somatic dysfunction of lumbar region: Secondary | ICD-10-CM | POA: Diagnosis not present

## 2023-04-16 DIAGNOSIS — M9901 Segmental and somatic dysfunction of cervical region: Secondary | ICD-10-CM | POA: Diagnosis not present

## 2023-04-16 NOTE — Patient Outreach (Signed)
  Care Coordination   Initial Visit Note   04/16/2023 Name: [name] MRN: [id number] DOB: Jan 04, 1956  [name] is a 66 y.o. year old male who sees Bosie Clos, MD for primary care. I spoke with  Alm Bustard by phone today.  What matters to the patients health and wellness today?  Remain healthy and attend medical appointments    Goals Addressed             This Visit's Progress    COMPLETED: Care Coordination Activities - No follow up needed       Interventions Today    Flowsheet Row Most Recent Value  Chronic Disease   Chronic disease during today's visit Other  [Melanoma, HLD, Hypothyroid]  General Interventions   General Interventions Discussed/Reviewed General Interventions Reviewed, Labs, Doctor Visits  Labs --  [Thyroid function]  Doctor Visits Discussed/Reviewed Doctor Visits Reviewed, Annual Wellness Visits, PCP, Specialist  [facility] 8/12, PCP for AWV 8/13, Dermatology 8/21]  PCP/Specialist Visits Compliance with follow-up visit  Jorje Guild with endocrine, last visit in March]  Education Interventions   Education Provided Provided Education  Provided Verbal Education On Medication, When to see the doctor, Nutrition  Nutrition Interventions   Nutrition Discussed/Reviewed Nutrition Reviewed, Adding fruits and vegetables, Decreasing sugar intake, Decreasing fats, Decreasing salt              SDOH assessments and interventions completed:  Yes  SDOH Interventions Today    Flowsheet Row Most Recent Value  SDOH Interventions   Food Insecurity Interventions Intervention Not Indicated  Housing Interventions Intervention Not Indicated  Transportation Interventions Intervention Not Indicated  Utilities Interventions Intervention Not Indicated        Care Coordination Interventions:  Yes, provided   Follow up plan: No further intervention required.   Encounter Outcome:  Pt. Visit Completed   Kemper Durie, RN, MSN, [facility] Care  Management Care Management Coordinator [phone]

## 2023-04-21 DIAGNOSIS — R69 Illness, unspecified: Secondary | ICD-10-CM | POA: Diagnosis not present

## 2023-04-22 ENCOUNTER — Other Ambulatory Visit: Payer: Self-pay | Admitting: Internal Medicine

## 2023-04-29 DIAGNOSIS — H40023 Open angle with borderline findings, high risk, bilateral: Secondary | ICD-10-CM | POA: Diagnosis not present

## 2023-04-29 DIAGNOSIS — H43813 Vitreous degeneration, bilateral: Secondary | ICD-10-CM | POA: Diagnosis not present

## 2023-05-19 ENCOUNTER — Inpatient Hospital Stay (HOSPITAL_BASED_OUTPATIENT_CLINIC_OR_DEPARTMENT_OTHER): Payer: Medicare HMO | Admitting: Internal Medicine

## 2023-05-19 ENCOUNTER — Inpatient Hospital Stay: Payer: Medicare HMO | Attending: Oncology

## 2023-05-19 ENCOUNTER — Encounter: Payer: Self-pay | Admitting: Internal Medicine

## 2023-05-19 DIAGNOSIS — R200 Anesthesia of skin: Secondary | ICD-10-CM | POA: Insufficient documentation

## 2023-05-19 DIAGNOSIS — Z801 Family history of malignant neoplasm of trachea, bronchus and lung: Secondary | ICD-10-CM | POA: Insufficient documentation

## 2023-05-19 DIAGNOSIS — C773 Secondary and unspecified malignant neoplasm of axilla and upper limb lymph nodes: Secondary | ICD-10-CM | POA: Insufficient documentation

## 2023-05-19 DIAGNOSIS — Z85828 Personal history of other malignant neoplasm of skin: Secondary | ICD-10-CM | POA: Insufficient documentation

## 2023-05-19 DIAGNOSIS — H43399 Other vitreous opacities, unspecified eye: Secondary | ICD-10-CM | POA: Insufficient documentation

## 2023-05-19 DIAGNOSIS — Z808 Family history of malignant neoplasm of other organs or systems: Secondary | ICD-10-CM | POA: Insufficient documentation

## 2023-05-19 DIAGNOSIS — C779 Secondary and unspecified malignant neoplasm of lymph node, unspecified: Secondary | ICD-10-CM

## 2023-05-19 DIAGNOSIS — E039 Hypothyroidism, unspecified: Secondary | ICD-10-CM | POA: Insufficient documentation

## 2023-05-19 DIAGNOSIS — R030 Elevated blood-pressure reading, without diagnosis of hypertension: Secondary | ICD-10-CM | POA: Diagnosis not present

## 2023-05-19 DIAGNOSIS — E2740 Unspecified adrenocortical insufficiency: Secondary | ICD-10-CM | POA: Diagnosis not present

## 2023-05-19 DIAGNOSIS — C4359 Malignant melanoma of other part of trunk: Secondary | ICD-10-CM | POA: Diagnosis not present

## 2023-05-19 LAB — CMP (CANCER CENTER ONLY)
ALT: 17 U/L (ref 0–44)
AST: 21 U/L (ref 15–41)
Albumin: 4 g/dL (ref 3.5–5.0)
Alkaline Phosphatase: 47 U/L (ref 38–126)
Anion gap: 10 (ref 5–15)
BUN: 22 mg/dL (ref 8–23)
CO2: 27 mmol/L (ref 22–32)
Calcium: 9 mg/dL (ref 8.9–10.3)
Chloride: 102 mmol/L (ref 98–111)
Creatinine: 1.17 mg/dL (ref 0.61–1.24)
GFR, Estimated: >60 mL/min (ref >60)
Glucose, Bld: 95 mg/dL (ref 70–99)
Potassium: 4.1 mmol/L (ref 3.5–5.1)
Sodium: 139 mmol/L (ref 135–145)
Total Bilirubin: 0.7 mg/dL (ref 0.3–1.2)
Total Protein: 6.2 g/dL — ABNORMAL LOW (ref 6.5–8.1)

## 2023-05-19 LAB — CBC WITH DIFFERENTIAL (CANCER CENTER ONLY)
Abs Immature Granulocytes: 0.03 10*3/uL (ref 0.00–0.07)
Basophils Absolute: 0.1 10*3/uL (ref 0.0–0.1)
Basophils Relative: 1 %
Eosinophils Absolute: 0.2 10*3/uL (ref 0.0–0.5)
Eosinophils Relative: 3 %
HCT: 45.1 % (ref 39.0–52.0)
Hemoglobin: 14.9 g/dL (ref 13.0–17.0)
Immature Granulocytes: 1 %
Lymphocytes Relative: 40 %
Lymphs Abs: 2.4 10*3/uL (ref 0.7–4.0)
MCH: 31.7 pg (ref 26.0–34.0)
MCHC: 33 g/dL (ref 30.0–36.0)
MCV: 96 fL (ref 80.0–100.0)
Monocytes Absolute: 0.6 10*3/uL (ref 0.1–1.0)
Monocytes Relative: 9 %
Neutro Abs: 2.8 10*3/uL (ref 1.7–7.7)
Neutrophils Relative %: 46 %
Platelet Count: 216 10*3/uL (ref 150–400)
RBC: 4.7 MIL/uL (ref 4.22–5.81)
RDW: 13.5 % (ref 11.5–15.5)
WBC Count: 6 10*3/uL (ref 4.0–10.5)
nRBC: 0 % (ref 0.0–0.2)

## 2023-05-19 NOTE — Progress Notes (Signed)
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   There is sufficient material for ancillary molecular testing if needed  (block A3, A1, A2).   Comment:  Per provided outside pathology report the patient had an "at least pT1b"  malignant melanoma of the left lateral infraclavicular skin in 2015.  Touch preparations demonstrate predominantly discohesive large  pleomorphic cells, some with binucleation.  HE sections demonstrate  lymph node parenchyma involved by metastatic neoplasm.  The neoplastic  cells are pleomorphic with binucleation, focal intranuclear inclusions,  and associated tumor necrosis. The neoplastic cells demonstrate the  following pattern of immunoreactivity:  S100: Positive  SOX10: Positive  HMB-45: Negative  Melan-A: Negative  Super pancytokeratin: Negative  CD45: Negative  This pattern of immunoreactivity supports the above diagnosis.   # NOV 7th, 2022-neoadjuvant Keytruda cycle #1  # [id number], 26th, [name],  majority of these foci measure 1-2 mm in greatest dimension, with the largest measuring 4 mm.  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  Accompanied by his wife.   [name] 67 y.o.  male pleasant patient above history of recurrent melanoma left axillary lymphadenopathy-s/p surgery is currently status post adjuvant Keytruda-with iatrogenic hypothyroidism /adrenal insufficiency is here for follow-up.  Pt not sure which provider is prescribing his meds. Has physical tomorrow.   Has some tingling in fingers at times while sleeping.   C/o vision changes/floaters, has seen eye  doctor.    Patient denies any shortness of breath or cough.  No fever no chills.  No skin rash.  Review of Systems  Constitutional:  Negative for chills, diaphoresis, fever, malaise/fatigue and weight loss.  HENT:  Negative for nosebleeds and sore throat.   Eyes:  Negative for double vision.  Respiratory:  Negative for hemoptysis, sputum production, shortness of breath and wheezing.   Cardiovascular:  Negative for chest pain, palpitations, orthopnea and leg swelling.  Gastrointestinal:  Negative for abdominal pain, blood in stool, constipation, diarrhea, heartburn, melena, nausea and vomiting.  Genitourinary:  Negative for dysuria, frequency and urgency.  Musculoskeletal:  Positive for myalgias. Negative for  back pain and joint pain.  Skin: Negative.  Negative for itching and rash.  Neurological:  Positive for tingling.  Negative for dizziness, focal weakness and weakness.  Endo/Heme/Allergies:  Does not bruise/bleed easily.  Psychiatric/Behavioral:  Negative for depression. The patient is not nervous/anxious and does not have insomnia.       PAST MEDICAL HISTORY :  Past Medical History:  Diagnosis Date   Basal cell carcinoma 03/10/2013   Right medial forehead. Excised, margins free.   Dysplastic nevus 11/05/2006   Mid back paraspinal, [name], close to edge. Excised 12/24/2006, margins free.   Dysplastic nevus 12/15/2008   Left lat. pectoral area. Moderate atypia, extends to one edge.    Dysplastic nevus 12/07/2009   Left mid side. Moderate atypia, close to margin.   Dysplastic nevus 12/07/2009   Right posterior waistline. Moderate atypia, close to margin.   Dysplastic nevus 02/10/2014   Left paraspinal mid back. Moderate atypia, lateral and deep margin involved.    Dysplastic nevus 06/16/2014   Left epigastric. Mild atypia, deep margin involved.    GERD (gastroesophageal reflux disease)    Hx of basal cell carcinoma    multiple sites   Hx of malignant melanoma 11/11/2013   L lateral infraclavicular, superficial spreading, Breslow's 0.55mm, Clark's level III   Hypothyroidism    Melanoma (HCC) 11/11/2013   Left lateral infraclavicular. MM, superficial spreading. Anatomic level III. Tumor thickness 0.19mm. Excised 11/24/2013, margins free.    Metastatic melanoma (HCC) 07/2021   L axilla, excised by Dr. Birdie Sons, followd by Dr. Racheal Patches with [facility]    PAST SURGICAL HISTORY :   Past Surgical History:  Procedure Laterality Date   AXILLARY LYMPH NODE DISSECTION Left 11/02/2021   Procedure: AXILLARY LYMPH NODE DISSECTION;  Surgeon: Earline Mayotte, MD;  [name];  Service: General;  Laterality: Left;   COLONOSCOPY WITH PROPOFOL N/A 01/20/2017   Procedure: COLONOSCOPY WITH PROPOFOL;  Surgeon: Christena Deem, MD;  Location: [facility] ENDOSCOPY;   Service: Endoscopy;  [name];   ESOPHAGOGASTRODUODENOSCOPY (EGD) WITH ESOPHAGEAL DILATION     MELANOMA EXCISION Right 11/24/2013   lateral infraclavicular   WISDOM TOOTH EXTRACTION     WRIST SURGERY Left 2022    FAMILY HISTORY :   Family History  Problem Relation Age of Onset   Macular degeneration Mother    Hyperlipidemia Mother    Melanoma Mother 72       rt leg   Heart disease Father    Heart attack Father    Lung cancer Father        metastasized    SOCIAL HISTORY:   Social History   Tobacco Use   Smoking status: Never   Smokeless tobacco: Never  Vaping Use   Vaping status: Never Used  Substance Use Topics   Alcohol use: Yes    Comment: about 1-3 glass of wine with dinner   Drug use: No    ALLERGIES:  has No Known Allergies.  MEDICATIONS:  Current Outpatient Medications  Medication Sig Dispense Refill   aspirin 81 MG tablet Take 81 mg by mouth daily.     hydrocortisone (CORTEF) 10 MG tablet TAKE 2 PILLS IN THE MORNING AND 1 PILL AT NIGHT. DO NOT STOP UNTIL DIRECTED. TAKE WITH FOOD. 90 tablet 1   levothyroxine (SYNTHROID) 112 MCG tablet Take 112 mcg by mouth daily before breakfast.     Multiple Vitamins tablet Take 1 tablet by mouth daily.  Multiple Vitamins-Minerals (PRESERVISION AREDS 2 PO) Take 1 capsule by mouth in the morning and at bedtime.     pantoprazole (PROTONIX) 20 MG tablet Take 20 mg by mouth daily.  0   Pembrolizumab (KEYTRUDA IV) Inject 1 Dose into the vein every 21 ( twenty-one) days.     No current facility-administered medications for this visit.    PHYSICAL EXAMINATION: ECOG PERFORMANCE STATUS: 0 - Asymptomatic  BP (!) 160/94 (BP Location: Right Arm, Patient Position: Sitting, Cuff Size: Normal)   Pulse 60   Temp (!) 97.5 F (36.4 C) (Tympanic)   Ht 6\' 1"  (1.854 m)   Wt 195 lb 12.8 oz (88.8 kg)   SpO2 100%   BMI 25.83 kg/m   Filed Weights   05/19/23 0948  Weight: 195 lb 12.8 oz (88.8 kg)    Physical Exam Vitals  and nursing note reviewed.  HENT:     Head: Normocephalic and atraumatic.     Mouth/Throat:     Pharynx: Oropharynx is clear.  Eyes:     Extraocular Movements: Extraocular movements intact.     Pupils: Pupils are equal, round, and reactive to light.  Cardiovascular:     Rate and Rhythm: Normal rate and regular rhythm.  Pulmonary:     Comments: Decreased breath sounds bilaterally.  Abdominal:     Palpations: Abdomen is soft.  Musculoskeletal:        General: Normal range of motion.     Cervical back: Normal range of motion.  Skin:    General: Skin is warm.  Neurological:     General: No focal deficit present.     Mental Status: He is alert and oriented to person, place, and time.  Psychiatric:        Behavior: Behavior normal.        Judgment: Judgment normal.        LABORATORY DATA:  I have reviewed the data as listed    Component Value Date/Time   NA 139 05/19/2023 0953   NA 144 05/16/2022 0953   K 4.1 05/19/2023 0953   CL 102 05/19/2023 0953   CO2 27 05/19/2023 0953   GLUCOSE 95 05/19/2023 0953   BUN 22 05/19/2023 0953   BUN 12 05/16/2022 0953   CREATININE 1.17 05/19/2023 0953   CALCIUM 9.0 05/19/2023 0953   PROT 6.2 (L) 05/19/2023 0953   PROT 5.7 (L) 05/16/2022 0953   ALBUMIN 4.0 05/19/2023 0953   ALBUMIN 3.9 05/16/2022 0953   AST 21 05/19/2023 0953   ALT 17 05/19/2023 0953   ALKPHOS 47 05/19/2023 0953   BILITOT 0.7 05/19/2023 0953   GFRNONAA >60 05/19/2023 0953   GFRAA 75 05/10/2020 1017    No results found for: "SPEP", "UPEP"  Lab Results  Component Value Date   WBC 6.0 05/19/2023   NEUTROABS 2.8 05/19/2023   HGB 14.9 05/19/2023   HCT 45.1 05/19/2023   MCV 96.0 05/19/2023   PLT 216 05/19/2023      Chemistry      Component Value Date/Time   NA 139 05/19/2023 0953   NA 144 05/16/2022 0953   K 4.1 05/19/2023 0953   CL 102 05/19/2023 0953   CO2 27 05/19/2023 0953   BUN 22 05/19/2023 0953   BUN 12 05/16/2022 0953   CREATININE 1.17  05/19/2023 0953   GLU 96 10/19/2014 0000      Component Value Date/Time   CALCIUM 9.0 05/19/2023 0953   ALKPHOS 47 05/19/2023 0953   AST 21 05/19/2023  0953   ALT 17 05/19/2023 0953   BILITOT 0.7 05/19/2023 0953       RADIOGRAPHIC STUDIES: I have personally reviewed the radiological images as listed and agreed with the findings in the report. No results found.   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  S/p neoadjuvant Keytruda x4- -Status post axillary lymph node dissection-with residual micrometastatic disease.  Patient currently s/p  adjuvant Keytruda x18 cycles.  MAY 7th, 2024- Stable examination without convincing evidence of metastatic disease within the chest, abdomen, or pelvis. NED- stable.   # slightly elevated BP- 150/80s- [? Cortef; no weight gain] recommend monitoring for now. Stable.   # Floaters in vision- awaiting re- evaluation with opthalmology-   # Tingling and numbness in fingers- Bil UE- in AM-question carpal tunnel versus history of Dupuytren's contracture.  Monitor for now.  #Endocrinopathies: Hypothyroidism/adrenal insufficiency: [s/p evaluation- SEP 2023- Endocrinology-] iatrogenic hypothyroidism on Synthroid 112  mcg once a day; SEP 2023- TSH 0.15; Normal T3/T4-.  Adrenal insufficiency -extreme fatigue--currently on hydrocortsione 20 mg in the morning and 10 mg at night. S/p Endocrinology appt- stable.   # DISPOSITION:   # follow up in 3 months-MD; labs- cbc/cmp;thyroid profile; CT CAP prior-  Dr.B.       Orders Placed This Encounter  Procedures   CT CHEST ABDOMEN PELVIS W CONTRAST    Standing Status:   Future    Standing Expiration Date:   05/18/2024    Order Specific Question:   If indicated for the ordered procedure, I authorize the administration of contrast media per Radiology protocol    Answer:   Yes    Order Specific Question:   Does the patient have a  contrast media/X-ray dye allergy?    Answer:   No    Order Specific Question:   Preferred imaging location?    Answer:   [facility]    Order Specific Question:   If indicated for the ordered procedure, I authorize the administration of oral contrast media per Radiology protocol    Answer:   Yes   CBC with Differential (Cancer Center Only)    Standing Status:   Future    Standing Expiration Date:   05/18/2024   CMP (Cancer Center only)    Standing Status:   Future    Standing Expiration Date:   05/18/2024   Thyroid Panel With TSH    Standing Status:   Future    Standing Expiration Date:   05/18/2024    All questions were answered. The patient knows to call the clinic with any problems, questions or concerns.      Earna Coder, MD 05/19/2023 10:57 AM

## 2023-05-19 NOTE — Assessment & Plan Note (Addendum)
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  S/p neoadjuvant Keytruda x4- -Status post axillary lymph node dissection-with residual micrometastatic disease.  Patient currently s/p  adjuvant Keytruda x18 cycles.  MAY 7th, 2024- Stable examination without convincing evidence of metastatic disease within the chest, abdomen, or pelvis. NED- stable.   # slightly elevated BP- 150/80s- [? Cortef; no weight gain] recommend monitoring for now. Stable.   # Floaters in vision- awaiting re- evaluation with opthalmology-   # Tingling and numbness in fingers- Bil UE- in AM-question carpal tunnel versus history of Dupuytren's contracture.  Monitor for now.  #Endocrinopathies: Hypothyroidism/adrenal insufficiency: [s/p evaluation- SEP 2023- Endocrinology-] iatrogenic hypothyroidism on Synthroid 112  mcg once a day; SEP 2023- TSH 0.15; Normal T3/T4-.  Adrenal insufficiency -extreme fatigue--currently on hydrocortsione 20 mg in the morning and 10 mg at night. S/p Endocrinology appt- stable.   # DISPOSITION:   # follow up in 3 months-MD; labs- cbc/cmp;thyroid profile; CT CAP prior-  Dr.B.

## 2023-05-19 NOTE — Progress Notes (Signed)
Pt not sure which provider is prescribing his meds.  Has physical tomorrow.  Has some tingling in fingers at times while sleeping.  C/o vision changes/floaters, has seen eye  doctor.

## 2023-05-20 ENCOUNTER — Ambulatory Visit: Payer: Medicare HMO | Admitting: Internal Medicine

## 2023-05-20 ENCOUNTER — Other Ambulatory Visit: Payer: Medicare HMO

## 2023-05-20 ENCOUNTER — Encounter: Payer: Medicare HMO | Admitting: Family Medicine

## 2023-05-20 ENCOUNTER — Other Ambulatory Visit: Payer: Self-pay

## 2023-05-20 DIAGNOSIS — E032 Hypothyroidism due to medicaments and other exogenous substances: Secondary | ICD-10-CM | POA: Diagnosis not present

## 2023-05-20 DIAGNOSIS — Z136 Encounter for screening for cardiovascular disorders: Secondary | ICD-10-CM | POA: Diagnosis not present

## 2023-05-20 DIAGNOSIS — Z125 Encounter for screening for malignant neoplasm of prostate: Secondary | ICD-10-CM | POA: Diagnosis not present

## 2023-05-20 DIAGNOSIS — E271 Primary adrenocortical insufficiency: Secondary | ICD-10-CM | POA: Diagnosis not present

## 2023-05-20 DIAGNOSIS — E782 Mixed hyperlipidemia: Secondary | ICD-10-CM | POA: Diagnosis not present

## 2023-05-20 DIAGNOSIS — Z0000 Encounter for general adult medical examination without abnormal findings: Secondary | ICD-10-CM | POA: Diagnosis not present

## 2023-05-21 DIAGNOSIS — M62830 Muscle spasm of back: Secondary | ICD-10-CM | POA: Diagnosis not present

## 2023-05-21 DIAGNOSIS — M5033 Other cervical disc degeneration, cervicothoracic region: Secondary | ICD-10-CM | POA: Diagnosis not present

## 2023-05-21 DIAGNOSIS — M9901 Segmental and somatic dysfunction of cervical region: Secondary | ICD-10-CM | POA: Diagnosis not present

## 2023-05-21 DIAGNOSIS — M9903 Segmental and somatic dysfunction of lumbar region: Secondary | ICD-10-CM | POA: Diagnosis not present

## 2023-05-22 DIAGNOSIS — R69 Illness, unspecified: Secondary | ICD-10-CM | POA: Diagnosis not present

## 2023-05-28 ENCOUNTER — Ambulatory Visit: Payer: Medicare HMO | Admitting: Dermatology

## 2023-05-28 VITALS — BP 135/80 | HR 67

## 2023-05-28 DIAGNOSIS — L578 Other skin changes due to chronic exposure to nonionizing radiation: Secondary | ICD-10-CM

## 2023-05-28 DIAGNOSIS — Z85828 Personal history of other malignant neoplasm of skin: Secondary | ICD-10-CM | POA: Diagnosis not present

## 2023-05-28 DIAGNOSIS — L820 Inflamed seborrheic keratosis: Secondary | ICD-10-CM

## 2023-05-28 DIAGNOSIS — D1801 Hemangioma of skin and subcutaneous tissue: Secondary | ICD-10-CM | POA: Diagnosis not present

## 2023-05-28 DIAGNOSIS — Z85820 Personal history of malignant melanoma of skin: Secondary | ICD-10-CM | POA: Diagnosis not present

## 2023-05-28 DIAGNOSIS — W908XXA Exposure to other nonionizing radiation, initial encounter: Secondary | ICD-10-CM | POA: Diagnosis not present

## 2023-05-28 DIAGNOSIS — Z86018 Personal history of other benign neoplasm: Secondary | ICD-10-CM

## 2023-05-28 DIAGNOSIS — Z872 Personal history of diseases of the skin and subcutaneous tissue: Secondary | ICD-10-CM | POA: Diagnosis not present

## 2023-05-28 DIAGNOSIS — L570 Actinic keratosis: Secondary | ICD-10-CM

## 2023-05-28 DIAGNOSIS — Z1283 Encounter for screening for malignant neoplasm of skin: Secondary | ICD-10-CM | POA: Diagnosis not present

## 2023-05-28 DIAGNOSIS — L814 Other melanin hyperpigmentation: Secondary | ICD-10-CM | POA: Diagnosis not present

## 2023-05-28 DIAGNOSIS — L821 Other seborrheic keratosis: Secondary | ICD-10-CM | POA: Diagnosis not present

## 2023-05-28 DIAGNOSIS — D229 Melanocytic nevi, unspecified: Secondary | ICD-10-CM

## 2023-05-28 DIAGNOSIS — C779 Secondary and unspecified malignant neoplasm of lymph node, unspecified: Secondary | ICD-10-CM

## 2023-05-28 NOTE — Patient Instructions (Addendum)

## 2023-05-28 NOTE — Progress Notes (Signed)
Follow-Up Visit   Subjective  [name] is a 67 y.o. male who presents for the following: Skin Cancer Screening and Full Body Skin Exam hx of aks, hx of isk, hx of melanoma, hx of bcc, hx of dysplastic   Reports a rough spot at right shin  The patient presents for Total-Body Skin Exam (TBSE) for skin cancer screening and mole check. The patient has spots, moles and lesions to be evaluated, some may be new or changing and the patient may have concern these could be cancer.  The following portions of the chart were reviewed this encounter and updated as appropriate: medications, allergies, medical history  Review of Systems:  No other skin or systemic complaints except as noted in HPI or Assessment and Plan.  Objective  Well appearing patient in no apparent distress; mood and affect are within normal limits.  A full examination was performed including scalp, head, eyes, ears, nose, lips, neck, chest, axillae, abdomen, back, buttocks, bilateral upper extremities, bilateral lower extremities, hands, feet, fingers, toes, fingernails, and toenails. All findings within normal limits unless otherwise noted below.   Relevant physical exam findings are noted in the Assessment and Plan.  right temple x 3 (3) Erythematous thin papules/macules with gritty scale.   right leg x 2 (2) Erythematous stuck-on, waxy papule or plaque   Assessment & Plan   SKIN CANCER SCREENING PERFORMED TODAY.  ACTINIC DAMAGE - Chronic condition, secondary to cumulative UV/sun exposure - diffuse scaly erythematous macules with underlying dyspigmentation - Recommend daily broad spectrum sunscreen SPF 30+ to sun-exposed areas, reapply every 2 hours as needed.  - Staying in the shade or wearing long sleeves, sun glasses (UVA+UVB protection) and wide brim hats (4-inch brim around the entire circumference of the hat) are also recommended for sun protection.  - Call for new or changing lesions.  LENTIGINES,  SEBORRHEIC KERATOSES, HEMANGIOMAS - Benign normal skin lesions - Benign-appearing - Call for any changes  MELANOCYTIC NEVI - Tan-brown and/or pink-flesh-colored symmetric macules and papules - Benign appearing on exam today - Observation - Call clinic for new or changing moles - Recommend daily use of broad spectrum spf 30+ sunscreen to sun-exposed areas.   HISTORY OF BASAL CELL CARCINOMA OF THE SKIN See history  - No evidence of recurrence today - Recommend regular full body skin exams - Recommend daily broad spectrum sunscreen SPF 30+ to sun-exposed areas, reapply every 2 hours as needed.  - Call if any new or changing lesions are noted between office visits  HISTORY OF DYSPLASTIC NEVUS Multiple locations see history  No evidence of recurrence today Recommend regular full body skin exams Recommend daily broad spectrum sunscreen SPF 30+ to sun-exposed areas, reapply every 2 hours as needed.  Call if any new or changing lesions are noted between office visits  HISTORY OF MELANOMA Metastatic melanoma in remission Left axilla + Lymph node excised by Dr. Birdie Sons, 07/2021 followed by Dr. Racheal Patches on [place]  Scans recently all negative Left lateral infraclavicular 11/2013  - No evidence of recurrence today - No lymphadenopathy - Recommend regular full body skin exams - Recommend daily broad spectrum sunscreen SPF 30+ to sun-exposed areas, reapply every 2 hours as needed.  - Call if any new or changing lesions are noted between office visits  Actinic keratosis (3) right temple x 3  Actinic keratoses are precancerous spots that appear secondary to cumulative UV radiation exposure/sun exposure over time. They are chronic with expected duration over 1 year. A portion  of actinic keratoses will progress to squamous cell carcinoma of the skin. It is not possible to reliably predict which spots will progress to skin cancer and so treatment is recommended to prevent development of skin  cancer.  Recommend daily broad spectrum sunscreen SPF 30+ to sun-exposed areas, reapply every 2 hours as needed.  Recommend staying in the shade or wearing long sleeves, sun glasses (UVA+UVB protection) and wide brim hats (4-inch brim around the entire circumference of the hat). Call for new or changing lesions.  Destruction of lesion - right temple x 3 (3) Complexity: simple   Destruction method: cryotherapy   Informed consent: discussed and consent obtained   Timeout:  patient name, date of birth, surgical site, and procedure verified Lesion destroyed using liquid nitrogen: Yes   Region frozen until ice ball extended beyond lesion: Yes   Outcome: patient tolerated procedure well with no complications   Post-procedure details: wound care instructions given    Inflamed seborrheic keratosis (2) right leg x 2  Symptomatic, irritating, patient would like treated.  Destruction of lesion - right leg x 2 (2) Complexity: simple   Destruction method: cryotherapy   Informed consent: discussed and consent obtained   Timeout:  patient name, date of birth, surgical site, and procedure verified Lesion destroyed using liquid nitrogen: Yes   Region frozen until ice ball extended beyond lesion: Yes   Outcome: patient tolerated procedure well with no complications   Post-procedure details: wound care instructions given     Return in about 6 months (around 11/28/2023) for TBSE.  IAsher Muir, CMA, am acting as scribe for Armida Sans, MD.'  Documentation: I have reviewed the above documentation for accuracy and completeness, and I agree with the above.  Armida Sans, MD

## 2023-05-29 ENCOUNTER — Other Ambulatory Visit: Payer: Self-pay

## 2023-06-03 ENCOUNTER — Encounter: Payer: Self-pay | Admitting: Dermatology

## 2023-06-18 ENCOUNTER — Other Ambulatory Visit: Payer: Self-pay | Admitting: Internal Medicine

## 2023-06-23 DIAGNOSIS — R69 Illness, unspecified: Secondary | ICD-10-CM | POA: Diagnosis not present

## 2023-06-25 DIAGNOSIS — M9903 Segmental and somatic dysfunction of lumbar region: Secondary | ICD-10-CM | POA: Diagnosis not present

## 2023-06-25 DIAGNOSIS — M62830 Muscle spasm of back: Secondary | ICD-10-CM | POA: Diagnosis not present

## 2023-06-25 DIAGNOSIS — M9901 Segmental and somatic dysfunction of cervical region: Secondary | ICD-10-CM | POA: Diagnosis not present

## 2023-06-25 DIAGNOSIS — M5033 Other cervical disc degeneration, cervicothoracic region: Secondary | ICD-10-CM | POA: Diagnosis not present

## 2023-07-17 DIAGNOSIS — G4733 Obstructive sleep apnea (adult) (pediatric): Secondary | ICD-10-CM | POA: Diagnosis not present

## 2023-07-17 DIAGNOSIS — Z833 Family history of diabetes mellitus: Secondary | ICD-10-CM | POA: Diagnosis not present

## 2023-07-17 DIAGNOSIS — E271 Primary adrenocortical insufficiency: Secondary | ICD-10-CM | POA: Diagnosis not present

## 2023-07-17 DIAGNOSIS — K219 Gastro-esophageal reflux disease without esophagitis: Secondary | ICD-10-CM | POA: Diagnosis not present

## 2023-07-17 DIAGNOSIS — Z8249 Family history of ischemic heart disease and other diseases of the circulatory system: Secondary | ICD-10-CM | POA: Diagnosis not present

## 2023-07-17 DIAGNOSIS — F325 Major depressive disorder, single episode, in full remission: Secondary | ICD-10-CM | POA: Diagnosis not present

## 2023-07-17 DIAGNOSIS — I10 Essential (primary) hypertension: Secondary | ICD-10-CM | POA: Diagnosis not present

## 2023-07-17 DIAGNOSIS — Z85820 Personal history of malignant melanoma of skin: Secondary | ICD-10-CM | POA: Diagnosis not present

## 2023-07-17 DIAGNOSIS — E785 Hyperlipidemia, unspecified: Secondary | ICD-10-CM | POA: Diagnosis not present

## 2023-07-17 DIAGNOSIS — Z809 Family history of malignant neoplasm, unspecified: Secondary | ICD-10-CM | POA: Diagnosis not present

## 2023-07-17 DIAGNOSIS — E039 Hypothyroidism, unspecified: Secondary | ICD-10-CM | POA: Diagnosis not present

## 2023-07-17 DIAGNOSIS — G319 Degenerative disease of nervous system, unspecified: Secondary | ICD-10-CM | POA: Diagnosis not present

## 2023-07-18 DIAGNOSIS — I10 Essential (primary) hypertension: Secondary | ICD-10-CM | POA: Diagnosis not present

## 2023-07-22 DIAGNOSIS — R69 Illness, unspecified: Secondary | ICD-10-CM | POA: Diagnosis not present

## 2023-07-23 DIAGNOSIS — M9901 Segmental and somatic dysfunction of cervical region: Secondary | ICD-10-CM | POA: Diagnosis not present

## 2023-07-23 DIAGNOSIS — M9903 Segmental and somatic dysfunction of lumbar region: Secondary | ICD-10-CM | POA: Diagnosis not present

## 2023-07-23 DIAGNOSIS — M62830 Muscle spasm of back: Secondary | ICD-10-CM | POA: Diagnosis not present

## 2023-07-23 DIAGNOSIS — M5033 Other cervical disc degeneration, cervicothoracic region: Secondary | ICD-10-CM | POA: Diagnosis not present

## 2023-08-14 ENCOUNTER — Other Ambulatory Visit: Payer: Self-pay | Admitting: Internal Medicine

## 2023-08-19 ENCOUNTER — Ambulatory Visit
Admission: RE | Admit: 2023-08-19 | Discharge: 2023-08-19 | Disposition: A | Discharge status: Home / Self Care | Payer: Medicare HMO | Source: Ambulatory Visit | Attending: Internal Medicine | Admitting: Internal Medicine

## 2023-08-19 DIAGNOSIS — K5730 Diverticulosis of large intestine without perforation or abscess without bleeding: Secondary | ICD-10-CM | POA: Diagnosis not present

## 2023-08-19 DIAGNOSIS — C779 Secondary and unspecified malignant neoplasm of lymph node, unspecified: Secondary | ICD-10-CM | POA: Insufficient documentation

## 2023-08-19 DIAGNOSIS — D1803 Hemangioma of intra-abdominal structures: Secondary | ICD-10-CM | POA: Diagnosis not present

## 2023-08-19 DIAGNOSIS — R932 Abnormal findings on diagnostic imaging of liver and biliary tract: Secondary | ICD-10-CM | POA: Diagnosis not present

## 2023-08-19 MED ORDER — IOHEXOL 300 MG/ML  SOLN
100.0000 mL | Freq: Once | INTRAMUSCULAR | Status: AC | PRN
  Administered 2023-08-19: 100 mL via INTRAVENOUS

## 2023-08-20 DIAGNOSIS — E032 Hypothyroidism due to medicaments and other exogenous substances: Secondary | ICD-10-CM | POA: Diagnosis not present

## 2023-08-20 DIAGNOSIS — M5033 Other cervical disc degeneration, cervicothoracic region: Secondary | ICD-10-CM | POA: Diagnosis not present

## 2023-08-20 DIAGNOSIS — M62830 Muscle spasm of back: Secondary | ICD-10-CM | POA: Diagnosis not present

## 2023-08-20 DIAGNOSIS — M9901 Segmental and somatic dysfunction of cervical region: Secondary | ICD-10-CM | POA: Diagnosis not present

## 2023-08-20 DIAGNOSIS — E271 Primary adrenocortical insufficiency: Secondary | ICD-10-CM | POA: Diagnosis not present

## 2023-08-20 DIAGNOSIS — I10 Essential (primary) hypertension: Secondary | ICD-10-CM | POA: Diagnosis not present

## 2023-08-20 DIAGNOSIS — M9903 Segmental and somatic dysfunction of lumbar region: Secondary | ICD-10-CM | POA: Diagnosis not present

## 2023-08-22 DIAGNOSIS — R69 Illness, unspecified: Secondary | ICD-10-CM | POA: Diagnosis not present

## 2023-09-01 ENCOUNTER — Encounter: Payer: Self-pay | Admitting: Internal Medicine

## 2023-09-01 ENCOUNTER — Inpatient Hospital Stay (HOSPITAL_BASED_OUTPATIENT_CLINIC_OR_DEPARTMENT_OTHER): Payer: Medicare HMO | Admitting: Internal Medicine

## 2023-09-01 ENCOUNTER — Inpatient Hospital Stay: Payer: Medicare HMO | Attending: Oncology

## 2023-09-01 VITALS — BP 136/93 | HR 59 | Temp 96.7°F | Ht 73.0 in | Wt 192.8 lb

## 2023-09-01 DIAGNOSIS — R030 Elevated blood-pressure reading, without diagnosis of hypertension: Secondary | ICD-10-CM | POA: Insufficient documentation

## 2023-09-01 DIAGNOSIS — Z801 Family history of malignant neoplasm of trachea, bronchus and lung: Secondary | ICD-10-CM | POA: Insufficient documentation

## 2023-09-01 DIAGNOSIS — Z79899 Other long term (current) drug therapy: Secondary | ICD-10-CM | POA: Diagnosis not present

## 2023-09-01 DIAGNOSIS — C773 Secondary and unspecified malignant neoplasm of axilla and upper limb lymph nodes: Secondary | ICD-10-CM | POA: Insufficient documentation

## 2023-09-01 DIAGNOSIS — E2740 Unspecified adrenocortical insufficiency: Secondary | ICD-10-CM | POA: Insufficient documentation

## 2023-09-01 DIAGNOSIS — C4359 Malignant melanoma of other part of trunk: Secondary | ICD-10-CM | POA: Insufficient documentation

## 2023-09-01 DIAGNOSIS — E039 Hypothyroidism, unspecified: Secondary | ICD-10-CM | POA: Insufficient documentation

## 2023-09-01 DIAGNOSIS — C779 Secondary and unspecified malignant neoplasm of lymph node, unspecified: Secondary | ICD-10-CM | POA: Diagnosis not present

## 2023-09-01 DIAGNOSIS — Z808 Family history of malignant neoplasm of other organs or systems: Secondary | ICD-10-CM | POA: Diagnosis not present

## 2023-09-01 DIAGNOSIS — R200 Anesthesia of skin: Secondary | ICD-10-CM | POA: Diagnosis not present

## 2023-09-01 LAB — CMP (CANCER CENTER ONLY)
ALT: 27 U/L (ref 0–44)
AST: 24 U/L (ref 15–41)
Albumin: 3.8 g/dL (ref 3.5–5.0)
Alkaline Phosphatase: 41 U/L (ref 38–126)
Anion gap: 9 (ref 5–15)
BUN: 19 mg/dL (ref 8–23)
CO2: 28 mmol/L (ref 22–32)
Calcium: 9 mg/dL (ref 8.9–10.3)
Chloride: 103 mmol/L (ref 98–111)
Creatinine: 1.2 mg/dL (ref 0.61–1.24)
GFR, Estimated: >60 mL/min (ref >60)
Glucose, Bld: 83 mg/dL (ref 70–99)
Potassium: 4.4 mmol/L (ref 3.5–5.1)
Sodium: 140 mmol/L (ref 135–145)
Total Bilirubin: 0.7 mg/dL (ref <1.2)
Total Protein: 6 g/dL — ABNORMAL LOW (ref 6.5–8.1)

## 2023-09-01 LAB — CBC WITH DIFFERENTIAL (CANCER CENTER ONLY)
Abs Immature Granulocytes: 0.02 10*3/uL (ref 0.00–0.07)
Basophils Absolute: 0.1 10*3/uL (ref 0.0–0.1)
Basophils Relative: 1 %
Eosinophils Absolute: 0.3 10*3/uL (ref 0.0–0.5)
Eosinophils Relative: 6 %
HCT: 42.6 % (ref 39.0–52.0)
Hemoglobin: 14.1 g/dL (ref 13.0–17.0)
Immature Granulocytes: 0 %
Lymphocytes Relative: 43 %
Lymphs Abs: 2.2 10*3/uL (ref 0.7–4.0)
MCH: 31.5 pg (ref 26.0–34.0)
MCHC: 33.1 g/dL (ref 30.0–36.0)
MCV: 95.1 fL (ref 80.0–100.0)
Monocytes Absolute: 0.6 10*3/uL (ref 0.1–1.0)
Monocytes Relative: 11 %
Neutro Abs: 2.1 10*3/uL (ref 1.7–7.7)
Neutrophils Relative %: 39 %
Platelet Count: 241 10*3/uL (ref 150–400)
RBC: 4.48 MIL/uL (ref 4.22–5.81)
RDW: 13 % (ref 11.5–15.5)
WBC Count: 5.2 10*3/uL (ref 4.0–10.5)
nRBC: 0 % (ref 0.0–0.2)

## 2023-09-01 NOTE — Assessment & Plan Note (Addendum)
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  S/p neoadjuvant Keytruda x4- -Status post axillary lymph node dissection-with residual micrometastatic disease.  Patient currently s/p  adjuvant Keytruda x18 cycles. NOV 11th, 2024-  No evidence of metastatic disease in the chest, abdomen or pelvis. Stable. Will plan scan q6M; will order at next visit.   # slightly elevated BP- 130/90s- [? Cortef; no weight gain] on norvasc-Stable.   # Tingling and numbness in fingers- Bil UE- in AM-question carpal tunnel versus history of Dupuytren's contracture- stable.   Monitor for now.  #Endocrinopathies: Hypothyroidism/adrenal insufficiency: [s/p evaluation- SEP 2023- Endocrinology-] iatrogenic hypothyroidism on Synthroid 112  mcg once a day; SEP 2023- TSH 0.15; Normal T3/T4-.  Adrenal insufficiency -extreme fatigue--currently on hydrocortsione 20 mg in the morning and 10 mg at night. S/p Endocrinology appt- stable.   #Incidental findings on Imaging  CT , 2024: liver benign cysts or hemangiomas and diverticulosis  I reviewed/discussed/counseled the patient.   # DISPOSITION:   # follow up in 3 months-MD; labs- cbc/cmp;thyroid profile; Dr.B.

## 2023-09-01 NOTE — Progress Notes (Signed)
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   There is sufficient material for ancillary molecular testing if needed  (block A3, A1, A2).   Comment:  Per provided outside pathology report the patient had an "at least pT1b"  malignant melanoma of the left lateral infraclavicular skin in 2015.  Touch preparations demonstrate predominantly discohesive large  pleomorphic cells, some with binucleation.  HE sections demonstrate  lymph node parenchyma involved by metastatic neoplasm.  The neoplastic  cells are pleomorphic with binucleation, focal intranuclear inclusions,  and associated tumor necrosis. The neoplastic cells demonstrate the  following pattern of immunoreactivity:  S100: Positive  SOX10: Positive  HMB-45: Negative  Melan-A: Negative  Super pancytokeratin: Negative  CD45: Negative  This pattern of immunoreactivity supports the above diagnosis.   # NOV 7th, 2022-neoadjuvant Keytruda cycle #1  # [id number], 26th, [name],  majority of these foci measure 1-2 mm in greatest dimension, with the largest measuring 4 mm.  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  Accompanied by his wife.   [name] 67 y.o.  male pleasant patient above history of recurrent melanoma left axillary lymphadenopathy-s/p surgery is currently status post adjuvant Keytruda-with iatrogenic hypothyroidism /adrenal insufficiency is here for follow-up/and review the results of the CT scan.  Patient states that he was Hydrocortisone he was on daily, was having insomnia, irritable, tingle in finger and toes, cut dose in half. However noted to have worsening fatigue, so patients is now back on full dose.   Given his elevated patient was started by PCP on amlodipine 5 mg every day.   Notes to have chronic mild arm/axilla ache; otherwise no swelling.    Patient denies any shortness of breath or cough.  No fever no chills.  No skin rash.  Review of Systems  Constitutional:  Negative for chills, diaphoresis, fever, malaise/fatigue and weight loss.  HENT:  Negative for nosebleeds and sore throat.   Eyes:  Negative for double vision.  Respiratory:  Negative for hemoptysis, sputum production, shortness of breath and wheezing.   Cardiovascular:  Negative for chest pain,  palpitations, orthopnea and leg swelling.  Gastrointestinal:  Negative for abdominal pain, blood in stool, constipation, diarrhea, heartburn, melena, nausea and vomiting.  Genitourinary:  Negative  for dysuria, frequency and urgency.  Musculoskeletal:  Positive for myalgias. Negative for back pain and joint pain.  Skin: Negative.  Negative for itching and rash.  Neurological:  Positive for tingling. Negative for dizziness, focal weakness and weakness.  Endo/Heme/Allergies:  Does not bruise/bleed easily.  Psychiatric/Behavioral:  Negative for depression. The patient is not nervous/anxious and does not have insomnia.       PAST MEDICAL HISTORY :  Past Medical History:  Diagnosis Date   Basal cell carcinoma 03/10/2013   Right medial forehead. Excised, margins free.   Dysplastic nevus 11/05/2006   Mid back paraspinal, [name], close to edge. Excised 12/24/2006, margins free.   Dysplastic nevus 12/15/2008   Left lat. pectoral area. Moderate atypia, extends to one edge.    Dysplastic nevus 12/07/2009   Left mid side. Moderate atypia, close to margin.   Dysplastic nevus 12/07/2009   Right posterior waistline. Moderate atypia, close to margin.   Dysplastic nevus 02/10/2014   Left paraspinal mid back. Moderate atypia, lateral and deep margin involved.    Dysplastic nevus 06/16/2014   Left epigastric. Mild atypia, deep margin involved.    GERD (gastroesophageal reflux disease)    Hx of basal cell carcinoma    multiple sites   Hx of malignant melanoma 11/11/2013   L lateral infraclavicular, superficial spreading, Breslow's 0.1mm, Clark's level III   Hypothyroidism    Melanoma (HCC) 11/11/2013   Left lateral infraclavicular. MM, superficial spreading. Anatomic level III. Tumor thickness 0.79mm. Excised 11/24/2013, margins free.    Metastatic melanoma (HCC) 07/2021   L axilla, excised by Dr. Birdie Sons, followd by Dr. Racheal Patches with Rande Lawman    PAST SURGICAL HISTORY :   Past Surgical History:  Procedure Laterality Date   AXILLARY LYMPH NODE DISSECTION Left 11/02/2021   Procedure: AXILLARY LYMPH NODE DISSECTION;  Surgeon: Earline Mayotte, MD;   [name];  Service: General;  Laterality: Left;   COLONOSCOPY WITH PROPOFOL N/A 01/20/2017   Procedure: COLONOSCOPY WITH PROPOFOL;  Surgeon: Christena Deem, MD;  Location: [facility];  Service: Endoscopy;  [name];   ESOPHAGOGASTRODUODENOSCOPY (EGD) WITH ESOPHAGEAL DILATION     MELANOMA EXCISION Right 11/24/2013   lateral infraclavicular   WISDOM TOOTH EXTRACTION     WRIST SURGERY Left 2022    FAMILY HISTORY :   Family History  Problem Relation Age of Onset   Macular degeneration Mother    Hyperlipidemia Mother    Melanoma Mother 51       rt leg   Heart disease Father    Heart attack Father    Lung cancer Father        metastasized    SOCIAL HISTORY:   Social History   Tobacco Use   Smoking status: Never   Smokeless tobacco: Never  Vaping Use   Vaping status: Never Used  Substance Use Topics   Alcohol use: Yes    Comment: about 1-3 glass of wine with dinner   Drug use: No    ALLERGIES:  has No Known Allergies.  MEDICATIONS:  Current Outpatient Medications  Medication Sig Dispense Refill   amLODipine (NORVASC) 5 MG tablet Take 5 mg by mouth daily.     aspirin 81 MG tablet Take 81 mg by mouth daily.     hydrocortisone (CORTEF) 10 MG tablet  TAKE 2 PILLS IN THE MORNING AND 1 PILL AT NIGHT. DO NOT STOP UNTIL DIRECTED. TAKE WITH FOOD. 90 tablet 1   levothyroxine (SYNTHROID) 112 MCG tablet Take 112 mcg by mouth daily before breakfast.     Multiple Vitamins tablet Take 1 tablet by mouth daily.     Multiple Vitamins-Minerals (PRESERVISION AREDS 2 PO) Take 1 capsule by mouth in the morning and at bedtime.     pantoprazole (PROTONIX) 20 MG tablet Take 20 mg by mouth daily.  0   No current facility-administered medications for this visit.    PHYSICAL EXAMINATION: ECOG PERFORMANCE STATUS: 0 - Asymptomatic  BP (!) 136/93 (BP Location: Right Arm, Patient Position: Sitting, Cuff Size: Normal)   Pulse (!) 59   Temp (!) 96.7 F (35.9 C) (Tympanic)    Ht 6\' 1"  (1.854 m)   Wt 192 lb 12.8 oz (87.5 kg)   SpO2 100%   BMI 25.44 kg/m   Filed Weights   09/01/23 1027  Weight: 192 lb 12.8 oz (87.5 kg)     Physical Exam Vitals and nursing note reviewed.  HENT:     Head: Normocephalic and atraumatic.     Mouth/Throat:     Pharynx: Oropharynx is clear.  Eyes:     Extraocular Movements: Extraocular movements intact.     Pupils: Pupils are equal, round, and reactive to light.  Cardiovascular:     Rate and Rhythm: Normal rate and regular rhythm.  Pulmonary:     Comments: Decreased breath sounds bilaterally.  Abdominal:     Palpations: Abdomen is soft.  Musculoskeletal:        General: Normal range of motion.     Cervical back: Normal range of motion.  Skin:    General: Skin is warm.  Neurological:     General: No focal deficit present.     Mental Status: He is alert and oriented to person, place, and time.  Psychiatric:        Behavior: Behavior normal.        Judgment: Judgment normal.        LABORATORY DATA:  I have reviewed the data as listed    Component Value Date/Time   NA 140 09/01/2023 1031   NA 144 05/16/2022 0953   K 4.4 09/01/2023 1031   CL 103 09/01/2023 1031   CO2 28 09/01/2023 1031   GLUCOSE 83 09/01/2023 1031   BUN 19 09/01/2023 1031   BUN 12 05/16/2022 0953   CREATININE 1.20 09/01/2023 1031   CALCIUM 9.0 09/01/2023 1031   PROT 6.0 (L) 09/01/2023 1031   PROT 5.7 (L) 05/16/2022 0953   ALBUMIN 3.8 09/01/2023 1031   ALBUMIN 3.9 05/16/2022 0953   AST 24 09/01/2023 1031   ALT 27 09/01/2023 1031   ALKPHOS 41 09/01/2023 1031   BILITOT 0.7 09/01/2023 1031   GFRNONAA >60 09/01/2023 1031   GFRAA 75 05/10/2020 1017    No results found for: "SPEP", "UPEP"  Lab Results  Component Value Date   WBC 5.2 09/01/2023   NEUTROABS 2.1 09/01/2023   HGB 14.1 09/01/2023   HCT 42.6 09/01/2023   MCV 95.1 09/01/2023   PLT 241 09/01/2023      Chemistry      Component Value Date/Time   NA 140 09/01/2023 1031    NA 144 05/16/2022 0953   K 4.4 09/01/2023 1031   CL 103 09/01/2023 1031   CO2 28 09/01/2023 1031   BUN 19 09/01/2023 1031   BUN 12 05/16/2022  6213   CREATININE 1.20 09/01/2023 1031   GLU 96 10/19/2014 0000      Component Value Date/Time   CALCIUM 9.0 09/01/2023 1031   ALKPHOS 41 09/01/2023 1031   AST 24 09/01/2023 1031   ALT 27 09/01/2023 1031   BILITOT 0.7 09/01/2023 1031       RADIOGRAPHIC STUDIES: I have personally reviewed the radiological images as listed and agreed with the findings in the report. No results found.   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  S/p neoadjuvant Keytruda x4- -Status post axillary lymph node dissection-with residual micrometastatic disease.  Patient currently s/p  adjuvant Keytruda x18 cycles. NOV 11th, 2024-  No evidence of metastatic disease in the chest, abdomen or pelvis. Stable. Will plan scan q6M; will order at next visit.   # slightly elevated BP- 130/90s- [? Cortef; no weight gain] on norvasc-Stable.   # Tingling and numbness in fingers- Bil UE- in AM-question carpal tunnel versus history of Dupuytren's contracture- stable.   Monitor for now.  #Endocrinopathies: Hypothyroidism/adrenal insufficiency: [s/p evaluation- SEP 2023- Endocrinology-] iatrogenic hypothyroidism on Synthroid 112  mcg once a day; SEP 2023- TSH 0.15; Normal T3/T4-.  Adrenal insufficiency -extreme fatigue--currently on hydrocortsione 20 mg in the morning and 10 mg at night. S/p Endocrinology appt- stable.   #Incidental findings on Imaging  CT , 2024: liver benign cysts or hemangiomas and diverticulosis  I reviewed/discussed/counseled the patient.   # DISPOSITION:   # follow up in 3 months-MD; labs- cbc/cmp;thyroid profile; Dr.B.       Orders Placed This Encounter  Procedures   CBC with Differential (Cancer Center Only)    Standing Status:   Future    Standing  Expiration Date:   08/31/2024   CMP (Cancer Center only)    Standing Status:   Future    Standing Expiration Date:   08/31/2024   Thyroid Panel With TSH    Standing Status:   Future    Standing Expiration Date:   08/31/2024    All questions were answered. The patient knows to call the clinic with any problems, questions or concerns.      Earna Coder, MD 09/01/2023 11:43 AM

## 2023-09-01 NOTE — Progress Notes (Signed)
CT CAP 08/19/23.  Hydrocortisone he was on daily, was having insomnia, irritable, tingle in finger and toes, cut dose in half, now back on full dose.  PCP rx'd amlodipine 5 mg every day.  C/o lt sided arm/axilla ache, x2 weeks.  C/o trouble sleeping maybe due to urination and rx sch.

## 2023-09-02 LAB — THYROID PANEL WITH TSH
Free Thyroxine Index: 1.9 (ref 1.2–4.9)
T3 Uptake Ratio: 28 % (ref 24–39)
T4, Total: 6.7 ug/dL (ref 4.5–12.0)
TSH: 1.85 u[IU]/mL (ref 0.450–4.500)

## 2023-09-08 DIAGNOSIS — H43391 Other vitreous opacities, right eye: Secondary | ICD-10-CM | POA: Diagnosis not present

## 2023-09-08 DIAGNOSIS — H43811 Vitreous degeneration, right eye: Secondary | ICD-10-CM | POA: Diagnosis not present

## 2023-09-08 DIAGNOSIS — H59813 Chorioretinal scars after surgery for detachment, bilateral: Secondary | ICD-10-CM | POA: Diagnosis not present

## 2023-09-08 DIAGNOSIS — H04123 Dry eye syndrome of bilateral lacrimal glands: Secondary | ICD-10-CM | POA: Diagnosis not present

## 2023-09-08 DIAGNOSIS — H35371 Puckering of macula, right eye: Secondary | ICD-10-CM | POA: Diagnosis not present

## 2023-09-17 DIAGNOSIS — M62830 Muscle spasm of back: Secondary | ICD-10-CM | POA: Diagnosis not present

## 2023-09-17 DIAGNOSIS — M9903 Segmental and somatic dysfunction of lumbar region: Secondary | ICD-10-CM | POA: Diagnosis not present

## 2023-09-17 DIAGNOSIS — M9901 Segmental and somatic dysfunction of cervical region: Secondary | ICD-10-CM | POA: Diagnosis not present

## 2023-09-17 DIAGNOSIS — M5033 Other cervical disc degeneration, cervicothoracic region: Secondary | ICD-10-CM | POA: Diagnosis not present

## 2023-10-15 DIAGNOSIS — M5033 Other cervical disc degeneration, cervicothoracic region: Secondary | ICD-10-CM | POA: Diagnosis not present

## 2023-10-15 DIAGNOSIS — M9901 Segmental and somatic dysfunction of cervical region: Secondary | ICD-10-CM | POA: Diagnosis not present

## 2023-10-15 DIAGNOSIS — M62830 Muscle spasm of back: Secondary | ICD-10-CM | POA: Diagnosis not present

## 2023-10-15 DIAGNOSIS — M9903 Segmental and somatic dysfunction of lumbar region: Secondary | ICD-10-CM | POA: Diagnosis not present

## 2023-10-16 ENCOUNTER — Other Ambulatory Visit: Payer: Self-pay | Admitting: Internal Medicine

## 2023-11-11 DIAGNOSIS — H40023 Open angle with borderline findings, high risk, bilateral: Secondary | ICD-10-CM | POA: Diagnosis not present

## 2023-11-11 DIAGNOSIS — H43813 Vitreous degeneration, bilateral: Secondary | ICD-10-CM | POA: Diagnosis not present

## 2023-11-11 DIAGNOSIS — H2513 Age-related nuclear cataract, bilateral: Secondary | ICD-10-CM | POA: Diagnosis not present

## 2023-11-11 DIAGNOSIS — H524 Presbyopia: Secondary | ICD-10-CM | POA: Diagnosis not present

## 2023-11-11 DIAGNOSIS — H5213 Myopia, bilateral: Secondary | ICD-10-CM | POA: Diagnosis not present

## 2023-11-12 DIAGNOSIS — M9901 Segmental and somatic dysfunction of cervical region: Secondary | ICD-10-CM | POA: Diagnosis not present

## 2023-11-12 DIAGNOSIS — M9903 Segmental and somatic dysfunction of lumbar region: Secondary | ICD-10-CM | POA: Diagnosis not present

## 2023-11-12 DIAGNOSIS — M5033 Other cervical disc degeneration, cervicothoracic region: Secondary | ICD-10-CM | POA: Diagnosis not present

## 2023-11-12 DIAGNOSIS — M62830 Muscle spasm of back: Secondary | ICD-10-CM | POA: Diagnosis not present

## 2023-12-08 ENCOUNTER — Inpatient Hospital Stay: Payer: Medicare HMO | Attending: Internal Medicine

## 2023-12-08 ENCOUNTER — Inpatient Hospital Stay (HOSPITAL_BASED_OUTPATIENT_CLINIC_OR_DEPARTMENT_OTHER): Payer: Medicare HMO | Admitting: Internal Medicine

## 2023-12-08 ENCOUNTER — Encounter: Payer: Self-pay | Admitting: Internal Medicine

## 2023-12-08 VITALS — BP 136/88 | HR 69 | Temp 97.1°F | Resp 16 | Ht 73.0 in | Wt 192.2 lb

## 2023-12-08 DIAGNOSIS — Z808 Family history of malignant neoplasm of other organs or systems: Secondary | ICD-10-CM | POA: Insufficient documentation

## 2023-12-08 DIAGNOSIS — E2740 Unspecified adrenocortical insufficiency: Secondary | ICD-10-CM | POA: Diagnosis not present

## 2023-12-08 DIAGNOSIS — Z801 Family history of malignant neoplasm of trachea, bronchus and lung: Secondary | ICD-10-CM | POA: Insufficient documentation

## 2023-12-08 DIAGNOSIS — C4359 Malignant melanoma of other part of trunk: Secondary | ICD-10-CM | POA: Insufficient documentation

## 2023-12-08 DIAGNOSIS — Z79890 Hormone replacement therapy (postmenopausal): Secondary | ICD-10-CM | POA: Diagnosis not present

## 2023-12-08 DIAGNOSIS — C779 Secondary and unspecified malignant neoplasm of lymph node, unspecified: Secondary | ICD-10-CM

## 2023-12-08 DIAGNOSIS — E039 Hypothyroidism, unspecified: Secondary | ICD-10-CM | POA: Diagnosis not present

## 2023-12-08 DIAGNOSIS — K219 Gastro-esophageal reflux disease without esophagitis: Secondary | ICD-10-CM | POA: Insufficient documentation

## 2023-12-08 DIAGNOSIS — C773 Secondary and unspecified malignant neoplasm of axilla and upper limb lymph nodes: Secondary | ICD-10-CM | POA: Diagnosis not present

## 2023-12-08 DIAGNOSIS — I10 Essential (primary) hypertension: Secondary | ICD-10-CM | POA: Insufficient documentation

## 2023-12-08 LAB — CBC WITH DIFFERENTIAL (CANCER CENTER ONLY)
Abs Immature Granulocytes: 0.02 10*3/uL (ref 0.00–0.07)
Basophils Absolute: 0 10*3/uL (ref 0.0–0.1)
Basophils Relative: 1 %
Eosinophils Absolute: 0.1 10*3/uL (ref 0.0–0.5)
Eosinophils Relative: 2 %
HCT: 45 % (ref 39.0–52.0)
Hemoglobin: 15.1 g/dL (ref 13.0–17.0)
Immature Granulocytes: 0 %
Lymphocytes Relative: 26 %
Lymphs Abs: 1.2 10*3/uL (ref 0.7–4.0)
MCH: 31.8 pg (ref 26.0–34.0)
MCHC: 33.6 g/dL (ref 30.0–36.0)
MCV: 94.7 fL (ref 80.0–100.0)
Monocytes Absolute: 0.3 10*3/uL (ref 0.1–1.0)
Monocytes Relative: 7 %
Neutro Abs: 3.1 10*3/uL (ref 1.7–7.7)
Neutrophils Relative %: 64 %
Platelet Count: 230 10*3/uL (ref 150–400)
RBC: 4.75 MIL/uL (ref 4.22–5.81)
RDW: 13.2 % (ref 11.5–15.5)
WBC Count: 4.8 10*3/uL (ref 4.0–10.5)
nRBC: 0 % (ref 0.0–0.2)

## 2023-12-08 LAB — CMP (CANCER CENTER ONLY)
ALT: 19 U/L (ref 0–44)
AST: 22 U/L (ref 15–41)
Albumin: 3.9 g/dL (ref 3.5–5.0)
Alkaline Phosphatase: 43 U/L (ref 38–126)
Anion gap: 9 (ref 5–15)
BUN: 16 mg/dL (ref 8–23)
CO2: 27 mmol/L (ref 22–32)
Calcium: 9.1 mg/dL (ref 8.9–10.3)
Chloride: 104 mmol/L (ref 98–111)
Creatinine: 1.18 mg/dL (ref 0.61–1.24)
GFR, Estimated: >60 mL/min (ref >60)
Glucose, Bld: 103 mg/dL — ABNORMAL HIGH (ref 70–99)
Potassium: 4.2 mmol/L (ref 3.5–5.1)
Sodium: 140 mmol/L (ref 135–145)
Total Bilirubin: 0.8 mg/dL (ref 0.0–1.2)
Total Protein: 6.3 g/dL — ABNORMAL LOW (ref 6.5–8.1)

## 2023-12-08 NOTE — Progress Notes (Signed)
 Survivorship Care Plan visit completed.  Treatment summary reviewed and given to patient.  ASCO answers booklet reviewed and given to patient.  CARE program and Cancer Transitions discussed with patient along with other resources cancer center offers to patients and caregivers.  Patient verbalized understanding.

## 2023-12-08 NOTE — Assessment & Plan Note (Addendum)
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  S/p neoadjuvant Keytruda x4- -Status post axillary lymph node dissection-with residual micrometastatic disease.  Patient currently s/p  adjuvant Keytruda x18 cycles. NOV 11th, 2024-  No evidence of metastatic disease in the chest, abdomen or pelvis. Stable. Will plan scan q6M; will order today.   # HTN- [? Cortef; no weight gain] on norvasc-Stable.   # Tingling and numbness in fingers- Bil UE- in AM-question carpal tunnel versus history of Dupuytren's contracture- stable.   Monitor for now.  #Endocrinopathies: Hypothyroidism/adrenal insufficiency: [s/p evaluation- SEP 2023- Endocrinology-] iatrogenic hypothyroidism on Synthroid 112  mcg once a day; SEP 2023- TSH 0.15; Normal T3/T4-.  Adrenal insufficiency -extreme fatigue--currently on hydrocortsione 20 mg in the morning and 10 mg at night. S/p Endocrinology appt- stable.   # Intermittent dysphia- Hx of GERD [hx of Esophagus stretching; [facility]] awaiting annual GI evaluation-   # DISPOSITION:   # follow up in 3 months-MD; labs- cbc/cmp;thyroid profile; CT CAP prior- Dr.B.

## 2023-12-08 NOTE — Progress Notes (Signed)
 C/o numbness and tingle in fingers/toes for a week about a month ago.  C/o throat being tender to the touch for about 2 weeks, better now.  Had a reaction to hydrocortisone, agitation and combative, med was cut back for a bit but now back to normal dose. Follows endocrinology.  Having some occasional trouble swallowing food and pills, is it time to have this re-evaluated?

## 2023-12-08 NOTE — Progress Notes (Signed)
 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   There is sufficient material for ancillary molecular testing if needed  (block A3, A1, A2).   Comment:  Per provided outside pathology report the patient had an "at least pT1b"  malignant melanoma of the left lateral infraclavicular skin in 2015.  Touch preparations demonstrate predominantly discohesive large  pleomorphic cells, some with binucleation.  HE sections demonstrate  lymph node parenchyma involved by metastatic neoplasm.  The neoplastic  cells are pleomorphic with binucleation, focal intranuclear inclusions,  and associated tumor necrosis. The neoplastic cells demonstrate the  following pattern of immunoreactivity:  S100: Positive  SOX10: Positive  HMB-45: Negative  Melan-A: Negative  Super pancytokeratin: Negative  CD45: Negative  This pattern of immunoreactivity supports the above diagnosis.   # NOV 7th, 2022-neoadjuvant Keytruda cycle #1  # [id number], 26th, [name],  majority of these foci measure 1-2 mm in greatest dimension, with the largest measuring 4 mm.  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  Accompanied by his wife.   [name] 68 y.o.  male pleasant patient above history of recurrent melanoma left axillary lymphadenopathy-s/p surgery is currently status post adjuvant Keytruda-with iatrogenic hypothyroidism /adrenal insufficiency is here for follow-up.  Patient continues to follow-up c/o intermittent numbness and tingle in fingers/toes.    C/o throat being tender to the touch for about 2 weeks, better now.   Had a reaction to hydrocortisone, agitation and combative, med was cut back for a bit but now back to normal dose. Follows endocrinology.   Having some occasional trouble swallowing food and pills.    Patient denies any shortness of breath or cough.  No fever no chills.  No skin rash.  Review of Systems  Constitutional:  Negative for chills, diaphoresis, fever, malaise/fatigue and weight loss.  HENT:  Negative for nosebleeds and sore throat.   Eyes:  Negative for double vision.  Respiratory:  Negative for hemoptysis, sputum production, shortness of breath and wheezing.   Cardiovascular:  Negative for chest pain, palpitations, orthopnea and leg swelling.  Gastrointestinal:  Negative for  abdominal pain, blood in stool, constipation, diarrhea, heartburn, melena, nausea and vomiting.  Genitourinary:  Negative for dysuria, frequency and urgency.   Musculoskeletal:  Positive for myalgias. Negative for back pain and joint pain.  Skin: Negative.  Negative for itching and rash.  Neurological:  Positive for tingling. Negative for dizziness, focal weakness and weakness.  Endo/Heme/Allergies:  Does not bruise/bleed easily.  Psychiatric/Behavioral:  Negative for depression. The patient is not nervous/anxious and does not have insomnia.       PAST MEDICAL HISTORY :  Past Medical History:  Diagnosis Date   Basal cell carcinoma 03/10/2013   Right medial forehead. Excised, margins free.   Dysplastic nevus 11/05/2006   Mid back paraspinal, [name], close to edge. Excised 12/24/2006, margins free.   Dysplastic nevus 12/15/2008   Left lat. pectoral area. Moderate atypia, extends to one edge.    Dysplastic nevus 12/07/2009   Left mid side. Moderate atypia, close to margin.   Dysplastic nevus 12/07/2009   Right posterior waistline. Moderate atypia, close to margin.   Dysplastic nevus 02/10/2014   Left paraspinal mid back. Moderate atypia, lateral and deep margin involved.    Dysplastic nevus 06/16/2014   Left epigastric. Mild atypia, deep margin involved.    GERD (gastroesophageal reflux disease)    Hx of basal cell carcinoma    multiple sites   Hx of malignant melanoma 11/11/2013   L lateral infraclavicular, superficial spreading, Breslow's 0.12mm, Clark's level III   Hypothyroidism    Melanoma (HCC) 11/11/2013   Left lateral infraclavicular. MM, superficial spreading. Anatomic level III. Tumor thickness 0.31mm. Excised 11/24/2013, margins free.    Metastatic melanoma (HCC) 07/2021   L axilla, excised by Dr. Birdie Sons, followd by Dr. Racheal Patches with Rande Lawman    PAST SURGICAL HISTORY :   Past Surgical History:  Procedure Laterality Date   AXILLARY LYMPH NODE DISSECTION Left 11/02/2021   Procedure: AXILLARY LYMPH NODE DISSECTION;  Surgeon: Earline Mayotte, MD;  [name];  Service: General;   Laterality: Left;   COLONOSCOPY WITH PROPOFOL N/A 01/20/2017   Procedure: COLONOSCOPY WITH PROPOFOL;  Surgeon: Christena Deem, MD;  Location: [facility] ENDOSCOPY;  Service: Endoscopy;  [name];   ESOPHAGOGASTRODUODENOSCOPY (EGD) WITH ESOPHAGEAL DILATION     MELANOMA EXCISION Right 11/24/2013   lateral infraclavicular   WISDOM TOOTH EXTRACTION     WRIST SURGERY Left 2022    FAMILY HISTORY :   Family History  Problem Relation Age of Onset   Macular degeneration Mother    Hyperlipidemia Mother    Melanoma Mother 48       rt leg   Heart disease Father    Heart attack Father    Lung cancer Father        metastasized    SOCIAL HISTORY:   Social History   Tobacco Use   Smoking status: Never   Smokeless tobacco: Never  Vaping Use   Vaping status: Never Used  Substance Use Topics   Alcohol use: Yes    Comment: about 1-3 glass of wine with dinner   Drug use: No    ALLERGIES:  has no known allergies.  MEDICATIONS:  Current Outpatient Medications  Medication Sig Dispense Refill   amLODipine (NORVASC) 5 MG tablet Take 5 mg by mouth daily.     aspirin 81 MG tablet Take 81 mg by mouth daily.     hydrocortisone (CORTEF) 10 MG tablet TAKE 2 PILLS IN THE MORNING AND 1 PILL AT  NIGHT. DO NOT STOP UNTIL DIRECTED. TAKE WITH FOOD. 90 tablet 1   levothyroxine (SYNTHROID) 112 MCG tablet Take 112 mcg by mouth daily before breakfast.     Multiple Vitamins tablet Take 1 tablet by mouth daily.     Multiple Vitamins-Minerals (PRESERVISION AREDS 2 PO) Take 1 capsule by mouth in the morning and at bedtime.     pantoprazole (PROTONIX) 20 MG tablet Take 20 mg by mouth daily.  0   No current facility-administered medications for this visit.    PHYSICAL EXAMINATION: ECOG PERFORMANCE STATUS: 0 - Asymptomatic  BP 136/88 (BP Location: Right Arm, Patient Position: Sitting, Cuff Size: Normal)   Pulse 69   Temp (!) 97.1 F (36.2 C) (Tympanic)   Resp 16   Ht 6\' 1"  (1.854 m)   Wt 192 lb 3.2  oz (87.2 kg)   SpO2 99%   BMI 25.36 kg/m   Filed Weights   12/08/23 1321  Weight: 192 lb 3.2 oz (87.2 kg)      Physical Exam Vitals and nursing note reviewed.  HENT:     Head: Normocephalic and atraumatic.     Mouth/Throat:     Pharynx: Oropharynx is clear.  Eyes:     Extraocular Movements: Extraocular movements intact.     Pupils: Pupils are equal, round, and reactive to light.  Cardiovascular:     Rate and Rhythm: Normal rate and regular rhythm.  Pulmonary:     Comments: Decreased breath sounds bilaterally.  Abdominal:     Palpations: Abdomen is soft.  Musculoskeletal:        General: Normal range of motion.     Cervical back: Normal range of motion.  Skin:    General: Skin is warm.  Neurological:     General: No focal deficit present.     Mental Status: He is alert and oriented to person, place, and time.  Psychiatric:        Behavior: Behavior normal.        Judgment: Judgment normal.        LABORATORY DATA:  I have reviewed the data as listed    Component Value Date/Time   NA 140 12/08/2023 1313   NA 144 05/16/2022 0953   K 4.2 12/08/2023 1313   CL 104 12/08/2023 1313   CO2 27 12/08/2023 1313   GLUCOSE 103 (H) 12/08/2023 1313   BUN 16 12/08/2023 1313   BUN 12 05/16/2022 0953   CREATININE 1.18 12/08/2023 1313   CALCIUM 9.1 12/08/2023 1313   PROT 6.3 (L) 12/08/2023 1313   PROT 5.7 (L) 05/16/2022 0953   ALBUMIN 3.9 12/08/2023 1313   ALBUMIN 3.9 05/16/2022 0953   AST 22 12/08/2023 1313   ALT 19 12/08/2023 1313   ALKPHOS 43 12/08/2023 1313   BILITOT 0.8 12/08/2023 1313   GFRNONAA >60 12/08/2023 1313   GFRAA 75 05/10/2020 1017    No results found for: "SPEP", "UPEP"  Lab Results  Component Value Date   WBC 4.8 12/08/2023   NEUTROABS 3.1 12/08/2023   HGB 15.1 12/08/2023   HCT 45.0 12/08/2023   MCV 94.7 12/08/2023   PLT 230 12/08/2023      Chemistry      Component Value Date/Time   NA 140 12/08/2023 1313   NA 144 05/16/2022 0953   K  4.2 12/08/2023 1313   CL 104 12/08/2023 1313   CO2 27 12/08/2023 1313   BUN 16 12/08/2023 1313   BUN 12 05/16/2022 0953   CREATININE 1.18 12/08/2023  1313   GLU 96 10/19/2014 0000      Component Value Date/Time   CALCIUM 9.1 12/08/2023 1313   ALKPHOS 43 12/08/2023 1313   AST 22 12/08/2023 1313   ALT 19 12/08/2023 1313   BILITOT 0.8 12/08/2023 1313       RADIOGRAPHIC STUDIES: I have personally reviewed the radiological images as listed and agreed with the findings in the report. No results found.   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  S/p neoadjuvant Keytruda x4- -Status post axillary lymph node dissection-with residual micrometastatic disease.  Patient currently s/p  adjuvant Keytruda x18 cycles. NOV 11th, 2024-  No evidence of metastatic disease in the chest, abdomen or pelvis. Stable. Will plan scan q6M; will order today.   # HTN- [? Cortef; no weight gain] on norvasc-Stable.   # Tingling and numbness in fingers- Bil UE- in AM-question carpal tunnel versus history of Dupuytren's contracture- stable.   Monitor for now.  #Endocrinopathies: Hypothyroidism/adrenal insufficiency: [s/p evaluation- SEP 2023- Endocrinology-] iatrogenic hypothyroidism on Synthroid 112  mcg once a day; SEP 2023- TSH 0.15; Normal T3/T4-.  Adrenal insufficiency -extreme fatigue--currently on hydrocortsione 20 mg in the morning and 10 mg at night. S/p Endocrinology appt- stable.   # Intermittent dysphia- Hx of GERD [hx of Esophagus stretching; [facility]] awaiting annual GI evaluation-   # DISPOSITION:   # follow up in 3 months-MD; labs- cbc/cmp;thyroid profile; CT CAP prior- Dr.B.       Orders Placed This Encounter  Procedures   CT CHEST ABDOMEN PELVIS W CONTRAST    Standing Status:   Future    Expected Date:   03/09/2024    Expiration Date:   12/07/2024    If indicated for the ordered procedure, I authorize  the administration of contrast media per Radiology protocol:   Yes    Does the patient have a contrast media/X-ray dye allergy?:   No    Preferred imaging location?:   [facility]    If indicated for the ordered procedure, I authorize the administration of oral contrast media per Radiology protocol:   Yes   CBC with Differential (Cancer Center Only)    Standing Status:   Future    Expected Date:   03/09/2024    Expiration Date:   12/07/2024   CMP (Cancer Center only)    Standing Status:   Future    Expected Date:   03/09/2024    Expiration Date:   12/07/2024   Thyroid Panel With TSH    Standing Status:   Future    Expected Date:   03/09/2024    Expiration Date:   12/07/2024    All questions were answered. The patient knows to call the clinic with any problems, questions or concerns.      Earna Coder, MD 12/08/2023 2:35 PM

## 2023-12-09 ENCOUNTER — Encounter: Payer: Self-pay | Admitting: Dermatology

## 2023-12-09 ENCOUNTER — Other Ambulatory Visit: Payer: Self-pay

## 2023-12-09 ENCOUNTER — Ambulatory Visit (INDEPENDENT_AMBULATORY_CARE_PROVIDER_SITE_OTHER): Payer: Medicare HMO | Admitting: Dermatology

## 2023-12-09 DIAGNOSIS — L814 Other melanin hyperpigmentation: Secondary | ICD-10-CM | POA: Diagnosis not present

## 2023-12-09 DIAGNOSIS — L578 Other skin changes due to chronic exposure to nonionizing radiation: Secondary | ICD-10-CM | POA: Diagnosis not present

## 2023-12-09 DIAGNOSIS — D1801 Hemangioma of skin and subcutaneous tissue: Secondary | ICD-10-CM

## 2023-12-09 DIAGNOSIS — L603 Nail dystrophy: Secondary | ICD-10-CM

## 2023-12-09 DIAGNOSIS — Z85828 Personal history of other malignant neoplasm of skin: Secondary | ICD-10-CM | POA: Diagnosis not present

## 2023-12-09 DIAGNOSIS — W908XXA Exposure to other nonionizing radiation, initial encounter: Secondary | ICD-10-CM | POA: Diagnosis not present

## 2023-12-09 DIAGNOSIS — L821 Other seborrheic keratosis: Secondary | ICD-10-CM | POA: Diagnosis not present

## 2023-12-09 DIAGNOSIS — Z1283 Encounter for screening for malignant neoplasm of skin: Secondary | ICD-10-CM | POA: Diagnosis not present

## 2023-12-09 DIAGNOSIS — L570 Actinic keratosis: Secondary | ICD-10-CM

## 2023-12-09 DIAGNOSIS — L820 Inflamed seborrheic keratosis: Secondary | ICD-10-CM | POA: Diagnosis not present

## 2023-12-09 DIAGNOSIS — Z86018 Personal history of other benign neoplasm: Secondary | ICD-10-CM

## 2023-12-09 DIAGNOSIS — Z85820 Personal history of malignant melanoma of skin: Secondary | ICD-10-CM | POA: Diagnosis not present

## 2023-12-09 DIAGNOSIS — D229 Melanocytic nevi, unspecified: Secondary | ICD-10-CM

## 2023-12-09 DIAGNOSIS — L918 Other hypertrophic disorders of the skin: Secondary | ICD-10-CM | POA: Diagnosis not present

## 2023-12-09 LAB — THYROID PANEL WITH TSH
Free Thyroxine Index: 2.3 (ref 1.2–4.9)
T3 Uptake Ratio: 27 % (ref 24–39)
T4, Total: 8.5 ug/dL (ref 4.5–12.0)
TSH: 0.831 u[IU]/mL (ref 0.450–4.500)

## 2023-12-09 NOTE — Progress Notes (Signed)
 Follow-Up Visit   Subjective  [name] is a 68 y.o. male who presents for the following: Skin Cancer Screening and Full Body Skin Exam Hx of metastatic melanoma, hx of dysplastic, hx of bcc, hx of aks and isks Spot at right shoulder scaly a couple month ago, bumps under arms,   The patient presents for Total-Body Skin Exam (TBSE) for skin cancer screening and mole check. The patient has spots, moles and lesions to be evaluated, some may be new or changing and the patient may have concern these could be cancer.   The following portions of the chart were reviewed this encounter and updated as appropriate: medications, allergies, medical history  Review of Systems:  No other skin or systemic complaints except as noted in HPI or Assessment and Plan.  Objective  Well appearing patient in no apparent distress; mood and affect are within normal limits.  A full examination was performed including scalp, head, eyes, ears, nose, lips, neck, chest, axillae, abdomen, back, buttocks, bilateral upper extremities, bilateral lower extremities, hands, feet, fingers, toes, fingernails, and toenails. All findings within normal limits unless otherwise noted below.   Relevant physical exam findings are noted in the Assessment and Plan.  Scalp x 7 (7) Erythematous thin papules/macules with gritty scale.  right shoulder x 1 Erythematous stuck-on, waxy papule or plaque  Assessment & Plan   SKIN CANCER SCREENING PERFORMED TODAY.  ACTINIC DAMAGE - Chronic condition, secondary to cumulative UV/sun exposure - diffuse scaly erythematous macules with underlying dyspigmentation - Recommend daily broad spectrum sunscreen SPF 30+ to sun-exposed areas, reapply every 2 hours as needed.  - Staying in the shade or wearing long sleeves, sun glasses (UVA+UVB protection) and wide brim hats (4-inch brim around the entire circumference of the hat) are also recommended for sun protection.  - Call for new or  changing lesions.  LENTIGINES, SEBORRHEIC KERATOSES, HEMANGIOMAS - Benign normal skin lesions - Benign-appearing - Call for any changes  HEMANGIOMA Exam: red papule at left thigh. Discussed benign nature. Recommend observation. Call for changes.  MELANOCYTIC NEVI - Tan-brown and/or pink-flesh-colored symmetric macules and papules - Benign appearing on exam today - Observation - Call clinic for new or changing moles - Recommend daily use of broad spectrum spf 30+ sunscreen to sun-exposed areas.   Acrochordons (Skin Tags) B/l axilla - Fleshy, skin-colored pedunculated papules - Benign appearing.  - Observe. - If desired, they can be removed with an in office procedure that is not covered by insurance. - Please call the clinic if you notice any new or changing lesions.  NAIL DYSTROPHY at B/L TOES and TOENAILS -appears trauma related Exam: dystrophic nail Treatment Plan: Caused by history of hiking and skiing    HISTORY OF MELANOMA Metastatic melanoma in remission Patient is no longer taking Keytruda Left axilla + Lymph node excised by Dr. Birdie Sons, 07/2021 followed by Dr. Racheal Patches  Scans recently all negative Left lateral infraclavicular 11/2013   Left lat infraclavicular History of Melanoma - Left lateral infraclavicular in February 2015; S/P Wide Local excision - clear with close follow up for past 7 years.  Patient met all recommended treatment protocol and follow up. Metastatic Melanoma of left axilla diagnosed 2022 - he was being treated with Rande Lawman last treatment was January.  Oncology exam recently with scans showed pt was "clear" of disease. Skin is Clear today.  No lymphadenopathy.  Observe for recurrence. Call clinic for new or changing lesions.  Recommend regular skin exams, daily broad-spectrum spf 30+  sunscreen use, and photoprotection.  Metastatic melanoma (HCC) Left Axilla Axillary excision per Dr Birdie Sons. All scans after finishing Keytruda have been clear.  MRI of brain is clear.  CT of Chest; abdomen and pelvis is clear  - No evidence of recurrence today - No lymphadenopathy - Recommend regular full body skin exams - Recommend daily broad spectrum sunscreen SPF 30+ to sun-exposed areas, reapply every 2 hours as needed.  - Call if any new or changing lesions are noted between office visits  HISTORY OF BASAL CELL CARCINOMA OF THE SKIN Right medial forehead excised margins free 03/10/2013 - No evidence of recurrence today - Recommend regular full body skin exams - Recommend daily broad spectrum sunscreen SPF 30+ to sun-exposed areas, reapply every 2 hours as needed.  - Call if any new or changing lesions are noted between office visits  HISTORY OF DYSPLASTIC NEVUS 02/10/2014 Left paraspinal mid back moderate atypia lateral  and deep margin involved 06/16/2014 - left epigastric - mild atypia deep margin involved 03/10/2013  right medial forehead - excised margins free 12/07/2009  left mid side - moderate atypia close to margin 12/07/2009 right posterior waistline moderate atypia close to margin 12/15/2008 left lateral pectoral area moderate atypia extends to one edge 11/05/2006 - mid back paraspinal , 1 cm lateral to spine  moderatley severe atypia close to margin  No evidence of recurrence today Recommend regular full body skin exams Recommend daily broad spectrum sunscreen SPF 30+ to sun-exposed areas, reapply every 2 hours as needed.  Call if any new or changing lesions are noted between office visits   ACTINIC KERATOSIS (7) Scalp x 7 (7) Actinic keratoses are precancerous spots that appear secondary to cumulative UV radiation exposure/sun exposure over time. They are chronic with expected duration over 1 year. A portion of actinic keratoses will progress to squamous cell carcinoma of the skin. It is not possible to reliably predict which spots will progress to skin cancer and so treatment is recommended to prevent development of skin  cancer.  Recommend daily broad spectrum sunscreen SPF 30+ to sun-exposed areas, reapply every 2 hours as needed.  Recommend staying in the shade or wearing long sleeves, sun glasses (UVA+UVB protection) and wide brim hats (4-inch brim around the entire circumference of the hat). Call for new or changing lesions. Destruction of lesion - Scalp x 7 (7) Complexity: simple   Destruction method: cryotherapy   Informed consent: discussed and consent obtained   Timeout:  patient name, date of birth, surgical site, and procedure verified Lesion destroyed using liquid nitrogen: Yes   Region frozen until ice ball extended beyond lesion: Yes   Outcome: patient tolerated procedure well with no complications   Post-procedure details: wound care instructions given   INFLAMED SEBORRHEIC KERATOSIS right shoulder x 1 Symptomatic, irritating, patient would like treated. Destruction of lesion - right shoulder x 1 Complexity: simple   Destruction method: cryotherapy   Informed consent: discussed and consent obtained   Timeout:  patient name, date of birth, surgical site, and procedure verified Lesion destroyed using liquid nitrogen: Yes   Region frozen until ice ball extended beyond lesion: Yes   Outcome: patient tolerated procedure well with no complications   Post-procedure details: wound care instructions given   Return in about 6 months (around 06/10/2024) for TBSE.  IAsher Muir, CMA, am acting as scribe for Armida Sans, MD.   Documentation: I have reviewed the above documentation for accuracy and completeness, and I agree with the above.  Armida Sans, MD

## 2023-12-09 NOTE — Patient Instructions (Addendum)
 Cryotherapy Aftercare  Wash gently with soap and water everyday.   Apply Vaseline and Band-Aid daily until healed.   Seborrheic Keratosis  What causes seborrheic keratoses? Seborrheic keratoses are harmless, common skin growths that first appear during adult life.  As time goes by, more growths appear.  Some people may develop a large number of them.  Seborrheic keratoses appear on both covered and uncovered body parts.  They are not caused by sunlight.  The tendency to develop seborrheic keratoses can be inherited.  They vary in color from skin-colored to gray, brown, or even black.  They can be either smooth or have a rough, warty surface.   Seborrheic keratoses are superficial and look as if they were stuck on the skin.  Under the microscope this type of keratosis looks like layers upon layers of skin.  That is why at times the top layer may seem to fall off, but the rest of the growth remains and re-grows.    Treatment Seborrheic keratoses do not need to be treated, but can easily be removed in the office.  Seborrheic keratoses often cause symptoms when they rub on clothing or jewelry.  Lesions can be in the way of shaving.  If they become inflamed, they can cause itching, soreness, or burning.  Removal of a seborrheic keratosis can be accomplished by freezing, burning, or surgery. If any spot bleeds, scabs, or grows rapidly, please return to have it checked, as these can be an indication of a skin cancer.   Actinic keratoses are precancerous spots that appear secondary to cumulative UV radiation exposure/sun exposure over time. They are chronic with expected duration over 1 year. A portion of actinic keratoses will progress to squamous cell carcinoma of the skin. It is not possible to reliably predict which spots will progress to skin cancer and so treatment is recommended to prevent development of skin cancer.  Recommend daily broad spectrum sunscreen SPF 30+ to sun-exposed areas, reapply  every 2 hours as needed.  Recommend staying in the shade or wearing long sleeves, sun glasses (UVA+UVB protection) and wide brim hats (4-inch brim around the entire circumference of the hat). Call for new or changing lesions.    Melanoma ABCDEs  Melanoma is the most dangerous type of skin cancer, and is the leading cause of death from skin disease.  You are more likely to develop melanoma if you: Have light-colored skin, light-colored eyes, or red or blond hair Spend a lot of time in the sun Tan regularly, either outdoors or in a tanning bed Have had blistering sunburns, especially during childhood Have a close family member who has had a melanoma Have atypical moles or large birthmarks  Early detection of melanoma is key since treatment is typically straightforward and cure rates are extremely high if we catch it early.   The first sign of melanoma is often a change in a mole or a new dark spot.  The ABCDE system is a way of remembering the signs of melanoma.  A for asymmetry:  The two halves do not match. B for border:  The edges of the growth are irregular. C for color:  A mixture of colors are present instead of an even brown color. D for diameter:  Melanomas are usually (but not always) greater than 6mm - the size of a pencil eraser. E for evolution:  The spot keeps changing in size, shape, and color.  Please check your skin once per month between visits. You can use a  small mirror in front and a large mirror behind you to keep an eye on the back side or your body.   If you see any new or changing lesions before your next follow-up, please call to schedule a visit.  Please continue daily skin protection including broad spectrum sunscreen SPF 30+ to sun-exposed areas, reapplying every 2 hours as needed when you're outdoors.   Due to recent changes in healthcare laws, you may see results of your pathology and/or laboratory studies on MyChart before the doctors have had a chance to  review them. We understand that in some cases there may be results that are confusing or concerning to you. Please understand that not all results are received at the same time and often the doctors may need to interpret multiple results in order to provide you with the best plan of care or course of treatment. Therefore, we ask that you please give [place] 2 business days to thoroughly review all your results before contacting the office for clarification. Should we see a critical lab result, you will be contacted sooner.   If You Need Anything After Your Visit  If you have any questions or concerns for your doctor, please call our main line at [phone] and press option 4 to reach your doctor's medical assistant. If no one answers, please leave a voicemail as directed and we will return your call as soon as possible. Messages left after 4 pm will be answered the following business day.   You may also send [place] a message via MyChart. We typically respond to MyChart messages within 1-2 business days.  For prescription refills, please ask your pharmacy to contact our office. Our fax number is [phone].  If you have an urgent issue when the clinic is closed that cannot wait until the next business day, you can page your doctor at the number below.    Please note that while we do our best to be available for urgent issues outside of office hours, we are not available 24/7.   If you have an urgent issue and are unable to reach [place], you may choose to seek medical care at your doctor's office, retail clinic, urgent care center, or emergency room.  If you have a medical emergency, please immediately call 911 or go to the emergency department.  Pager Numbers  - Dr. Gwen Pounds: [phone]  - Dr. Roseanne Reno: [phone]  - Dr. Katrinka Blazing: [phone]   In the event of inclement weather, please call our main line at [phone] for an update on the status of any delays or closures.  Dermatology  Medication Tips: Please keep the boxes that topical medications come in in order to help keep track of the instructions about where and how to use these. Pharmacies typically print the medication instructions only on the boxes and not directly on the medication tubes.   If your medication is too expensive, please contact our office at [phone] option 4 or send [place] a message through MyChart.   We are unable to tell what your co-pay for medications will be in advance as this is different depending on your insurance coverage. However, we may be able to find a substitute medication at lower cost or fill out paperwork to get insurance to cover a needed medication.   If a prior authorization is required to get your medication covered by your insurance company, please allow [place] 1-2 business days to complete this process.  Drug prices often vary depending on where the prescription is  filled and some pharmacies may offer cheaper prices.  The website www.goodrx.com contains coupons for medications through different pharmacies. The prices here do not account for what the cost may be with help from insurance (it may be cheaper with your insurance), but the website can give you the price if you did not use any insurance.  - You can print the associated coupon and take it with your prescription to the pharmacy.  - You may also stop by our office during regular business hours and pick up a GoodRx coupon card.  - If you need your prescription sent electronically to a different pharmacy, notify our office through [facility] or by phone at [phone] option 4.     Si Usted Necesita Algo Despus de Su Visita  Tambin puede enviarnos un mensaje a travs de Clinical cytogeneticist. Por lo general respondemos a los mensajes de MyChart en el transcurso de 1 a 2 das hbiles.  Para renovar recetas, por favor pida a su farmacia que se ponga en contacto con nuestra oficina. Annie Sable de fax es [place] [phone].  Si  tiene un asunto urgente cuando la clnica est cerrada y que no puede esperar hasta el siguiente da hbil, puede llamar/localizar a su doctor(a) al nmero que aparece a continuacin.   Por favor, tenga en cuenta que aunque hacemos todo lo posible para estar disponibles para asuntos urgentes fuera del horario de [place], no estamos disponibles las 24 horas del da, los 7 [address]  [address] de la [place].   Si tiene un problema urgente y no puede comunicarse con nosotros, puede optar por buscar atencin mdica  en el consultorio de su doctor(a), en una clnica privada, en un centro de atencin urgente o en una sala de emergencias.  Si tiene Engineer, drilling, por favor llame inmediatamente al 911 o vaya a la sala de emergencias.  Nmeros de bper  - Dr. Gwen Pounds: [phone]  - Dra. Roseanne Reno: [phone]  - Dr. Katrinka Blazing: [phone]   En caso de inclemencias del tiempo, por favor llame a Lacy Duverney principal al [phone] para una actualizacin sobre el [place] de cualquier retraso o cierre.  Consejos para la medicacin en dermatologa: Por favor, guarde las cajas en las que vienen los medicamentos de uso tpico para ayudarle a seguir las instrucciones sobre dnde y cmo usarlos. Las farmacias generalmente imprimen las instrucciones del medicamento slo en las cajas y no directamente en los tubos del [place].   Si su medicamento es muy caro, por favor, pngase en contacto con Rolm Gala llamando al [phone] y presione la opcin 4 o envenos un mensaje a travs de Clinical cytogeneticist.   No podemos decirle cul ser su copago por los medicamentos por adelantado ya que esto es diferente dependiendo de la cobertura de su seguro. Sin embargo, es posible que podamos encontrar un medicamento sustituto a Audiological scientist un formulario para que el seguro cubra el medicamento que se considera necesario.   Si se requiere una autorizacin previa para que su compaa de seguros [place] su medicamento, por  favor permtanos de 1 a 2 das hbiles para completar [address]  Los precios de los medicamentos varan con frecuencia dependiendo del Environmental consultant de dnde se surte la receta y alguna farmacias pueden ofrecer precios ms baratos.  El sitio web www.goodrx.com tiene cupones para medicamentos de Health and safety inspector. Los precios aqu no tienen en cuenta lo que podra costar con la ayuda del seguro (puede ser ms barato con su seguro), pero el sitio web puede  darle el precio si no utiliz [employer].  - Puede imprimir el cupn correspondiente y llevarlo con su receta a la farmacia.  - Tambin puede pasar por nuestra oficina durante el horario de atencin regular y Education officer, museum una tarjeta de cupones de GoodRx.  - Si necesita que su receta se enve electrnicamente a una farmacia diferente, informe a nuestra oficina a travs de MyChart de [place] o por telfono llamando al [phone] y presione la opcin 4.   Staying in the shade or wearing long sleeves, sun glasses (UVA+UVB protection) and wide brim hats (4-inch brim around the entire circumference of the hat) are also recommended for sun protection.

## 2023-12-10 ENCOUNTER — Other Ambulatory Visit: Payer: Self-pay

## 2023-12-15 ENCOUNTER — Other Ambulatory Visit: Payer: Self-pay | Admitting: Internal Medicine

## 2023-12-17 DIAGNOSIS — M9903 Segmental and somatic dysfunction of lumbar region: Secondary | ICD-10-CM | POA: Diagnosis not present

## 2023-12-17 DIAGNOSIS — M5033 Other cervical disc degeneration, cervicothoracic region: Secondary | ICD-10-CM | POA: Diagnosis not present

## 2023-12-17 DIAGNOSIS — M62830 Muscle spasm of back: Secondary | ICD-10-CM | POA: Diagnosis not present

## 2023-12-17 DIAGNOSIS — M9901 Segmental and somatic dysfunction of cervical region: Secondary | ICD-10-CM | POA: Diagnosis not present

## 2024-01-01 DIAGNOSIS — E039 Hypothyroidism, unspecified: Secondary | ICD-10-CM | POA: Diagnosis not present

## 2024-01-01 DIAGNOSIS — E2740 Unspecified adrenocortical insufficiency: Secondary | ICD-10-CM | POA: Diagnosis not present

## 2024-01-07 DIAGNOSIS — I10 Essential (primary) hypertension: Secondary | ICD-10-CM | POA: Diagnosis not present

## 2024-01-07 DIAGNOSIS — E039 Hypothyroidism, unspecified: Secondary | ICD-10-CM | POA: Diagnosis not present

## 2024-01-07 DIAGNOSIS — C439 Malignant melanoma of skin, unspecified: Secondary | ICD-10-CM | POA: Diagnosis not present

## 2024-01-07 DIAGNOSIS — Z7952 Long term (current) use of systemic steroids: Secondary | ICD-10-CM | POA: Diagnosis not present

## 2024-01-07 DIAGNOSIS — E2740 Unspecified adrenocortical insufficiency: Secondary | ICD-10-CM | POA: Diagnosis not present

## 2024-01-14 DIAGNOSIS — M62830 Muscle spasm of back: Secondary | ICD-10-CM | POA: Diagnosis not present

## 2024-01-14 DIAGNOSIS — M9901 Segmental and somatic dysfunction of cervical region: Secondary | ICD-10-CM | POA: Diagnosis not present

## 2024-01-14 DIAGNOSIS — M9903 Segmental and somatic dysfunction of lumbar region: Secondary | ICD-10-CM | POA: Diagnosis not present

## 2024-01-14 DIAGNOSIS — M5033 Other cervical disc degeneration, cervicothoracic region: Secondary | ICD-10-CM | POA: Diagnosis not present

## 2024-02-04 ENCOUNTER — Other Ambulatory Visit: Payer: Self-pay

## 2024-02-10 DIAGNOSIS — M9903 Segmental and somatic dysfunction of lumbar region: Secondary | ICD-10-CM | POA: Diagnosis not present

## 2024-02-10 DIAGNOSIS — M62830 Muscle spasm of back: Secondary | ICD-10-CM | POA: Diagnosis not present

## 2024-02-10 DIAGNOSIS — M9901 Segmental and somatic dysfunction of cervical region: Secondary | ICD-10-CM | POA: Diagnosis not present

## 2024-02-10 DIAGNOSIS — M5033 Other cervical disc degeneration, cervicothoracic region: Secondary | ICD-10-CM | POA: Diagnosis not present

## 2024-02-11 ENCOUNTER — Other Ambulatory Visit: Payer: Self-pay | Admitting: Internal Medicine

## 2024-02-17 DIAGNOSIS — I10 Essential (primary) hypertension: Secondary | ICD-10-CM | POA: Diagnosis not present

## 2024-03-02 ENCOUNTER — Ambulatory Visit
Admission: RE | Admit: 2024-03-02 | Discharge: 2024-03-02 | Disposition: A | Discharge status: Home / Self Care | Source: Ambulatory Visit | Attending: Internal Medicine | Admitting: Internal Medicine

## 2024-03-02 DIAGNOSIS — C779 Secondary and unspecified malignant neoplasm of lymph node, unspecified: Secondary | ICD-10-CM | POA: Insufficient documentation

## 2024-03-02 DIAGNOSIS — C439 Malignant melanoma of skin, unspecified: Secondary | ICD-10-CM | POA: Diagnosis not present

## 2024-03-02 DIAGNOSIS — D1803 Hemangioma of intra-abdominal structures: Secondary | ICD-10-CM | POA: Diagnosis not present

## 2024-03-02 DIAGNOSIS — K5730 Diverticulosis of large intestine without perforation or abscess without bleeding: Secondary | ICD-10-CM | POA: Diagnosis not present

## 2024-03-02 DIAGNOSIS — I700 Atherosclerosis of aorta: Secondary | ICD-10-CM | POA: Diagnosis not present

## 2024-03-02 MED ORDER — IOHEXOL 300 MG/ML  SOLN
100.0000 mL | Freq: Once | INTRAMUSCULAR | Status: AC | PRN
  Administered 2024-03-02: 100 mL via INTRAVENOUS

## 2024-03-08 ENCOUNTER — Ambulatory Visit

## 2024-03-09 DIAGNOSIS — M62830 Muscle spasm of back: Secondary | ICD-10-CM | POA: Diagnosis not present

## 2024-03-09 DIAGNOSIS — M9903 Segmental and somatic dysfunction of lumbar region: Secondary | ICD-10-CM | POA: Diagnosis not present

## 2024-03-09 DIAGNOSIS — M5033 Other cervical disc degeneration, cervicothoracic region: Secondary | ICD-10-CM | POA: Diagnosis not present

## 2024-03-09 DIAGNOSIS — M9901 Segmental and somatic dysfunction of cervical region: Secondary | ICD-10-CM | POA: Diagnosis not present

## 2024-03-15 ENCOUNTER — Encounter: Payer: Self-pay | Admitting: Internal Medicine

## 2024-03-15 ENCOUNTER — Inpatient Hospital Stay (HOSPITAL_BASED_OUTPATIENT_CLINIC_OR_DEPARTMENT_OTHER): Admitting: Internal Medicine

## 2024-03-15 ENCOUNTER — Ambulatory Visit: Admitting: Internal Medicine

## 2024-03-15 ENCOUNTER — Other Ambulatory Visit

## 2024-03-15 ENCOUNTER — Inpatient Hospital Stay: Attending: Internal Medicine

## 2024-03-15 VITALS — BP 133/87 | HR 59 | Temp 98.6°F | Resp 16 | Ht 73.0 in | Wt 198.3 lb

## 2024-03-15 DIAGNOSIS — Z9221 Personal history of antineoplastic chemotherapy: Secondary | ICD-10-CM | POA: Diagnosis not present

## 2024-03-15 DIAGNOSIS — C779 Secondary and unspecified malignant neoplasm of lymph node, unspecified: Secondary | ICD-10-CM | POA: Diagnosis not present

## 2024-03-15 DIAGNOSIS — Z801 Family history of malignant neoplasm of trachea, bronchus and lung: Secondary | ICD-10-CM | POA: Insufficient documentation

## 2024-03-15 DIAGNOSIS — E2740 Unspecified adrenocortical insufficiency: Secondary | ICD-10-CM | POA: Insufficient documentation

## 2024-03-15 DIAGNOSIS — Z808 Family history of malignant neoplasm of other organs or systems: Secondary | ICD-10-CM | POA: Insufficient documentation

## 2024-03-15 DIAGNOSIS — G4700 Insomnia, unspecified: Secondary | ICD-10-CM | POA: Insufficient documentation

## 2024-03-15 DIAGNOSIS — I10 Essential (primary) hypertension: Secondary | ICD-10-CM | POA: Diagnosis not present

## 2024-03-15 DIAGNOSIS — C773 Secondary and unspecified malignant neoplasm of axilla and upper limb lymph nodes: Secondary | ICD-10-CM | POA: Diagnosis not present

## 2024-03-15 DIAGNOSIS — E039 Hypothyroidism, unspecified: Secondary | ICD-10-CM | POA: Insufficient documentation

## 2024-03-15 DIAGNOSIS — C4359 Malignant melanoma of other part of trunk: Secondary | ICD-10-CM | POA: Diagnosis not present

## 2024-03-15 DIAGNOSIS — K5730 Diverticulosis of large intestine without perforation or abscess without bleeding: Secondary | ICD-10-CM | POA: Diagnosis not present

## 2024-03-15 DIAGNOSIS — Z79890 Hormone replacement therapy (postmenopausal): Secondary | ICD-10-CM | POA: Insufficient documentation

## 2024-03-15 LAB — CMP (CANCER CENTER ONLY)
ALT: 18 U/L (ref 0–44)
AST: 21 U/L (ref 15–41)
Albumin: 3.8 g/dL (ref 3.5–5.0)
Alkaline Phosphatase: 45 U/L (ref 38–126)
Anion gap: 6 (ref 5–15)
BUN: 20 mg/dL (ref 8–23)
CO2: 27 mmol/L (ref 22–32)
Calcium: 8.5 mg/dL — ABNORMAL LOW (ref 8.9–10.3)
Chloride: 106 mmol/L (ref 98–111)
Creatinine: 1.16 mg/dL (ref 0.61–1.24)
GFR, Estimated: >60 mL/min (ref >60)
Glucose, Bld: 98 mg/dL (ref 70–99)
Potassium: 4.3 mmol/L (ref 3.5–5.1)
Sodium: 139 mmol/L (ref 135–145)
Total Bilirubin: 0.7 mg/dL (ref 0.0–1.2)
Total Protein: 6 g/dL — ABNORMAL LOW (ref 6.5–8.1)

## 2024-03-15 LAB — CBC WITH DIFFERENTIAL (CANCER CENTER ONLY)
Abs Immature Granulocytes: 0.03 10*3/uL (ref 0.00–0.07)
Basophils Absolute: 0 10*3/uL (ref 0.0–0.1)
Basophils Relative: 1 %
Eosinophils Absolute: 0.1 10*3/uL (ref 0.0–0.5)
Eosinophils Relative: 1 %
HCT: 42.3 % (ref 39.0–52.0)
Hemoglobin: 13.9 g/dL (ref 13.0–17.0)
Immature Granulocytes: 1 %
Lymphocytes Relative: 23 %
Lymphs Abs: 1.2 10*3/uL (ref 0.7–4.0)
MCH: 31.4 pg (ref 26.0–34.0)
MCHC: 32.9 g/dL (ref 30.0–36.0)
MCV: 95.7 fL (ref 80.0–100.0)
Monocytes Absolute: 0.3 10*3/uL (ref 0.1–1.0)
Monocytes Relative: 6 %
Neutro Abs: 3.8 10*3/uL (ref 1.7–7.7)
Neutrophils Relative %: 68 %
Platelet Count: 212 10*3/uL (ref 150–400)
RBC: 4.42 MIL/uL (ref 4.22–5.81)
RDW: 13.4 % (ref 11.5–15.5)
WBC Count: 5.5 10*3/uL (ref 4.0–10.5)
nRBC: 0 % (ref 0.0–0.2)

## 2024-03-15 NOTE — Progress Notes (Signed)
 CT CAP 03/02/24.  C/o sharp feeling in hands and feet, comes and goes.  Pt states not sleeping well. Not taking any sleep aids. Taking hydrocortisone  around 9pm. Trying to change the time he takes it.

## 2024-03-15 NOTE — Progress Notes (Signed)
 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   There is sufficient material for ancillary molecular testing if needed  (block A3, A1, A2).   Comment:  Per provided outside pathology report the patient had an "at least pT1b"  malignant melanoma of the left lateral infraclavicular skin in 2015.  Touch preparations demonstrate predominantly discohesive large  pleomorphic cells, some with binucleation.  HE sections demonstrate  lymph node parenchyma involved by metastatic neoplasm.  The neoplastic  cells are pleomorphic with binucleation, focal intranuclear inclusions,  and associated tumor necrosis. The neoplastic cells demonstrate the  following pattern of immunoreactivity:  S100: Positive  SOX10: Positive  HMB-45: Negative  Melan-A: Negative  Super pancytokeratin: Negative  CD45: Negative  This pattern of immunoreactivity supports the above diagnosis.   # NOV 7th, 2022-neoadjuvant Keytruda  cycle #1  # [id number], 26th, [name],  majority of these foci measure 1-2 mm in greatest dimension, with the largest measuring 4 mm.  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  Accompanied by his wife.   [name] 68 y.o.  Haynes pleasant patient above history of recurrent melanoma left axillary lymphadenopathy-s/p surgery is currently status post adjuvant Keytruda -with iatrogenic hypothyroidism /adrenal insufficiency is here for follow-up/and review results of the PET scan.   Patient continues to follow-up c/o intermittent numbness and tingle in fingers/toes.    Pt states not sleeping well. Not taking any sleep aids. Taking hydrocortisone  around 9pm.    Patient denies any shortness of breath or cough.  No fever no chills.  No skin rash.  Review of Systems  Constitutional:  Negative for chills, diaphoresis, fever, malaise/fatigue and weight loss.  HENT:  Negative for nosebleeds and sore throat.   Eyes:  Negative for double vision.  Respiratory:  Negative for hemoptysis, sputum production, shortness of breath and wheezing.   Cardiovascular:  Negative for chest pain, palpitations, orthopnea and leg swelling.  Gastrointestinal:  Negative for abdominal pain, blood in stool, constipation, diarrhea, heartburn, melena, nausea and vomiting.  Genitourinary:  Negative for dysuria, frequency and urgency.  Musculoskeletal:  Positive for myalgias.  Negative for back pain and joint pain.  Skin: Negative.  Negative for itching and rash.  Neurological:   Positive for tingling. Negative for dizziness, focal weakness and weakness.  Endo/Heme/Allergies:  Does not bruise/bleed easily.  Psychiatric/Behavioral:  Negative for depression. The patient is not nervous/anxious and does not have insomnia.       PAST MEDICAL HISTORY :  Past Medical History:  Diagnosis Date   Basal cell carcinoma 03/10/2013   Right medial forehead. Excised, margins free.   Dysplastic nevus 11/05/2006   Mid back paraspinal, [name], close to edge. Excised 12/24/2006, margins free.   Dysplastic nevus 12/15/2008   Left lat. pectoral area. Moderate atypia, extends to one edge.    Dysplastic nevus 12/07/2009   Left mid side. Moderate atypia, close to margin.   Dysplastic nevus 12/07/2009   Right posterior waistline. Moderate atypia, close to margin.   Dysplastic nevus 02/10/2014   Left paraspinal mid back. Moderate atypia, lateral and deep margin involved.    Dysplastic nevus 06/16/2014   Left epigastric. Mild atypia, deep margin involved.    GERD (gastroesophageal reflux disease)    Hx of basal cell carcinoma    multiple sites   Hx of malignant melanoma 11/11/2013   L lateral infraclavicular, superficial spreading, Breslow's 0.58mm, Clark's level III   Hypothyroidism    Melanoma (HCC) 11/11/2013   Left lateral infraclavicular. MM, superficial spreading. Anatomic level III. Tumor thickness 0.67mm. Excised 11/24/2013, margins free.    Metastatic melanoma (HCC) 07/2021   L axilla, excised by Dr. Marthenia Slater, followd by Dr. Debbi Failing with Keytruda     PAST SURGICAL HISTORY :   Past Surgical History:  Procedure Laterality Date   AXILLARY LYMPH NODE DISSECTION Left 11/02/2021   Procedure: AXILLARY LYMPH NODE DISSECTION;  Surgeon: Marshall Skeeter, MD;  [name];  Service: General;  Laterality: Left;   COLONOSCOPY WITH PROPOFOL  N/A 01/20/2017   Procedure: COLONOSCOPY WITH PROPOFOL ;  Surgeon: Deveron Fly, MD;  Location:  [facility] ENDOSCOPY;  Service: Endoscopy;  [name];   ESOPHAGOGASTRODUODENOSCOPY (EGD) WITH ESOPHAGEAL DILATION     MELANOMA EXCISION Right 11/24/2013   lateral infraclavicular   WISDOM TOOTH EXTRACTION     WRIST SURGERY Left 2022    FAMILY HISTORY :   Family History  Problem Relation Age of Onset   Macular degeneration Mother    Hyperlipidemia Mother    Melanoma Mother 57       rt leg   Heart disease Father    Heart attack Father    Lung cancer Father        metastasized    SOCIAL HISTORY:   Social History   Tobacco Use   Smoking status: Never   Smokeless tobacco: Never  Vaping Use   Vaping status: Never Used  Substance Use Topics   Alcohol use: Yes    Comment: about 1-3 glass of wine with dinner   Drug use: No    ALLERGIES:  has no known allergies.  MEDICATIONS:  Current Outpatient Medications  Medication Sig Dispense Refill   amLODipine (NORVASC) 5 MG tablet Take 5 mg by mouth daily.     aspirin 81 MG tablet Take 81 mg by mouth daily.     hydrocortisone  (CORTEF ) 10 MG tablet TAKE 2 PILLS IN THE MORNING AND 1 PILL AT NIGHT. DO NOT STOP UNTIL DIRECTED. TAKE WITH FOOD. 90 tablet 1   levothyroxine  (SYNTHROID ) 112 MCG tablet Take 112 mcg by mouth daily before breakfast.  Multiple Vitamins tablet Take 1 tablet by mouth daily.     Multiple Vitamins-Minerals (PRESERVISION AREDS 2 PO) Take 1 capsule by mouth in the morning and at bedtime.     pantoprazole (PROTONIX) 20 MG tablet Take 20 mg by mouth daily.  0   No current facility-administered medications for this visit.    PHYSICAL EXAMINATION: ECOG PERFORMANCE STATUS: 0 - Asymptomatic  BP 133/87 (BP Location: Right Arm, Patient Position: Sitting, Cuff Size: Large)   Pulse (!) 59   Temp 98.6 F (37 C) (Tympanic)   Resp 16   Ht 6\' 1"  (1.854 m)   Wt 198 lb 4.8 oz (89.9 kg)   SpO2 99%   BMI 26.16 kg/m   Filed Weights   03/15/24 1306  Weight: 198 lb 4.8 oz (89.9 kg)      Physical Exam Vitals and  nursing note reviewed.  HENT:     Head: Normocephalic and atraumatic.     Mouth/Throat:     Pharynx: Oropharynx is clear.  Eyes:     Extraocular Movements: Extraocular movements intact.     Pupils: Pupils are equal, round, and reactive to light.  Cardiovascular:     Rate and Rhythm: Normal rate and regular rhythm.  Pulmonary:     Comments: Decreased breath sounds bilaterally.  Abdominal:     Palpations: Abdomen is soft.  Musculoskeletal:        General: Normal range of motion.     Cervical back: Normal range of motion.  Skin:    General: Skin is warm.  Neurological:     General: No focal deficit present.     Mental Status: He is alert and oriented to person, place, and time.  Psychiatric:        Behavior: Behavior normal.        Judgment: Judgment normal.        LABORATORY DATA:  I have reviewed the data as listed    Component Value Date/Time   NA 139 03/15/2024 1254   NA 144 05/16/2022 0953   K 4.3 03/15/2024 1254   CL 106 03/15/2024 1254   CO2 27 03/15/2024 1254   GLUCOSE 98 03/15/2024 1254   BUN 20 03/15/2024 1254   BUN 12 05/16/2022 0953   CREATININE 1.16 03/15/2024 1254   CALCIUM 8.5 (L) 03/15/2024 1254   PROT 6.0 (L) 03/15/2024 1254   PROT 5.7 (L) 05/16/2022 0953   ALBUMIN 3.8 03/15/2024 1254   ALBUMIN 3.9 05/16/2022 0953   AST 21 03/15/2024 1254   ALT 18 03/15/2024 1254   ALKPHOS 45 03/15/2024 1254   BILITOT 0.7 03/15/2024 1254   GFRNONAA >60 03/15/2024 1254   GFRAA 75 05/10/2020 1017    No results found for: "SPEP", "UPEP"  Lab Results  Component Value Date   WBC 5.5 03/15/2024   NEUTROABS 3.8 03/15/2024   HGB 13.9 03/15/2024   HCT 42.3 03/15/2024   MCV 95.7 03/15/2024   PLT 212 03/15/2024      Chemistry      Component Value Date/Time   NA 139 03/15/2024 1254   NA 144 05/16/2022 0953   K 4.3 03/15/2024 1254   CL 106 03/15/2024 1254   CO2 27 03/15/2024 1254   BUN 20 03/15/2024 1254   BUN 12 05/16/2022 0953   CREATININE 1.16  03/15/2024 1254   GLU 96 10/19/2014 0000      Component Value Date/Time   CALCIUM 8.5 (L) 03/15/2024 1254   ALKPHOS 45 03/15/2024 1254   AST  21 03/15/2024 1254   ALT 18 03/15/2024 1254   BILITOT 0.7 03/15/2024 1254       RADIOGRAPHIC STUDIES: I have personally reviewed the radiological images as listed and agreed with the findings in the report. No results found.   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  S/p neoadjuvant Keytruda  x4- -Status post axillary lymph node dissection-with residual micrometastatic disease.  Patient currently s/p  adjuvant Keytruda  x18 cycles. MAY 27th, 2025- Stable examination without evidence of metastatic disease within the chest, abdomen or pelvis-  Will plan scan q6M; will order today.   # Insomnia- sec to late night cortef - recommend taking cortef - late afternoon.   # HTN- [? Cortef ; no weight gain] on norvasc-Stable.   # Tingling and numbness in fingers- Bil UE- in AM-question carpal tunnel versus history of Dupuytren's contracture-  stable.  # Endocrinopathies: Hypothyroidism/adrenal insufficiency: [s/p evaluation- SEP 2023- Endocrinology-] iatrogenic hypothyroidism on Synthroid  112  mcg once a day; SEP 2023- TSH 0.15; Normal T3/T4-.  Adrenal insufficiency -extreme fatigue--currently on hydrocortsione 20 mg in the morning and 10 mg at night- see above re: insomnia- S/p Endocrinology appt- stable.   # Incidental findings on Imaging  CT , 2025: Colonic diverticulosis without evidence of acute diverticulitis; Aortic Atherosclerosis  [defer to Dr.Gilbert] Stable.I reviewed/discussed/counseled the patient.   # DISPOSITION:   # follow up in 6 months-MD; labs- cbc/cmp;thyroid  profile; CT CAP prior- Dr.B  # 25 minutes face-to-face with the patient discussing the above plan of care; more than 50% of time spent on prognosis/ natural history; counseling and  coordination.  # I reviewed the blood work- with the patient in detail; also reviewed the imaging independently [as summarized above]; and with the patient in detail.  .       Orders Placed This Encounter  Procedures   CT CHEST ABDOMEN PELVIS W CONTRAST    Standing Status:   Future    Expected Date:   09/14/2024    Expiration Date:   03/15/2025    If indicated for the ordered procedure, I authorize the administration of contrast media per Radiology protocol:   Yes    Does the patient have a contrast media/X-ray dye allergy?:   No    Preferred imaging location?:   [facility]    If indicated for the ordered procedure, I authorize the administration of oral contrast media per Radiology protocol:   Yes    All questions were answered. The patient knows to call the clinic with any problems, questions or concerns.      Gwyn Leos, MD 03/15/2024 2:02 PM

## 2024-03-15 NOTE — Addendum Note (Signed)
 Addended by: Vivi Grit on: 03/15/2024 02:10 PM   Modules accepted: Orders

## 2024-03-15 NOTE — Assessment & Plan Note (Addendum)
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  S/p neoadjuvant Keytruda  x4- -Status post axillary lymph node dissection-with residual micrometastatic disease.  Patient currently s/p  adjuvant Keytruda  x18 cycles. MAY 27th, 2025- Stable examination without evidence of metastatic disease within the chest, abdomen or pelvis-  Will plan scan q6M; will order today.   # Insomnia- sec to late night cortef - recommend taking cortef - late afternoon.   # HTN- [? Cortef ; no weight gain] on norvasc-Stable.   # Tingling and numbness in fingers- Bil UE- in AM-question carpal tunnel versus history of Dupuytren's contracture-  stable.  # Endocrinopathies: Hypothyroidism/adrenal insufficiency: [s/p evaluation- SEP 2023- Endocrinology-] iatrogenic hypothyroidism on Synthroid  112  mcg once a day; SEP 2023- TSH 0.15; Normal T3/T4-.  Adrenal insufficiency -extreme fatigue--currently on hydrocortsione 20 mg in the morning and 10 mg at night- see above re: insomnia- S/p Endocrinology appt- stable.   # Incidental findings on Imaging  CT , 2025: Colonic diverticulosis without evidence of acute diverticulitis; Aortic Atherosclerosis  [defer to Dr.Gilbert] Stable.I reviewed/discussed/counseled the patient.   # DISPOSITION:   # follow up in 6 months-MD; labs- cbc/cmp;thyroid  profile; CT CAP prior- Dr.B  # 25 minutes face-to-face with the patient discussing the above plan of care; more than 50% of time spent on prognosis/ natural history; counseling and coordination.  # I reviewed the blood work- with the patient in detail; also reviewed the imaging independently [as summarized above]; and with the patient in detail.  Aaron Aas

## 2024-03-16 ENCOUNTER — Other Ambulatory Visit: Payer: Self-pay

## 2024-03-16 ENCOUNTER — Encounter: Payer: Self-pay | Admitting: Internal Medicine

## 2024-03-16 LAB — THYROID PANEL WITH TSH
Free Thyroxine Index: 2.1 (ref 1.2–4.9)
T3 Uptake Ratio: 28 % (ref 24–39)
T4, Total: 7.6 ug/dL (ref 4.5–12.0)
TSH: 0.715 u[IU]/mL (ref 0.450–4.500)

## 2024-03-22 ENCOUNTER — Other Ambulatory Visit

## 2024-03-22 ENCOUNTER — Ambulatory Visit: Admitting: Internal Medicine

## 2024-04-06 DIAGNOSIS — M5033 Other cervical disc degeneration, cervicothoracic region: Secondary | ICD-10-CM | POA: Diagnosis not present

## 2024-04-06 DIAGNOSIS — M62830 Muscle spasm of back: Secondary | ICD-10-CM | POA: Diagnosis not present

## 2024-04-06 DIAGNOSIS — M9903 Segmental and somatic dysfunction of lumbar region: Secondary | ICD-10-CM | POA: Diagnosis not present

## 2024-04-06 DIAGNOSIS — M9901 Segmental and somatic dysfunction of cervical region: Secondary | ICD-10-CM | POA: Diagnosis not present

## 2024-04-09 ENCOUNTER — Other Ambulatory Visit: Payer: Self-pay | Admitting: Internal Medicine

## 2024-04-13 ENCOUNTER — Encounter: Payer: Self-pay | Admitting: Internal Medicine

## 2024-04-18 ENCOUNTER — Other Ambulatory Visit: Payer: Self-pay

## 2024-05-11 DIAGNOSIS — M9901 Segmental and somatic dysfunction of cervical region: Secondary | ICD-10-CM | POA: Diagnosis not present

## 2024-05-11 DIAGNOSIS — M62830 Muscle spasm of back: Secondary | ICD-10-CM | POA: Diagnosis not present

## 2024-05-11 DIAGNOSIS — M5033 Other cervical disc degeneration, cervicothoracic region: Secondary | ICD-10-CM | POA: Diagnosis not present

## 2024-05-11 DIAGNOSIS — M9903 Segmental and somatic dysfunction of lumbar region: Secondary | ICD-10-CM | POA: Diagnosis not present

## 2024-06-12 ENCOUNTER — Other Ambulatory Visit: Payer: Self-pay | Admitting: Internal Medicine

## 2024-06-18 ENCOUNTER — Encounter: Payer: Self-pay | Admitting: Internal Medicine

## 2024-06-22 ENCOUNTER — Ambulatory Visit: Admitting: Dermatology

## 2024-07-13 ENCOUNTER — Ambulatory Visit: Admitting: Dermatology

## 2024-07-13 ENCOUNTER — Encounter: Payer: Self-pay | Admitting: Dermatology

## 2024-07-13 DIAGNOSIS — D225 Melanocytic nevi of trunk: Secondary | ICD-10-CM | POA: Diagnosis not present

## 2024-07-13 DIAGNOSIS — L814 Other melanin hyperpigmentation: Secondary | ICD-10-CM | POA: Diagnosis not present

## 2024-07-13 DIAGNOSIS — L570 Actinic keratosis: Secondary | ICD-10-CM | POA: Diagnosis not present

## 2024-07-13 DIAGNOSIS — W908XXA Exposure to other nonionizing radiation, initial encounter: Secondary | ICD-10-CM | POA: Diagnosis not present

## 2024-07-13 DIAGNOSIS — Z86018 Personal history of other benign neoplasm: Secondary | ICD-10-CM

## 2024-07-13 DIAGNOSIS — Z1283 Encounter for screening for malignant neoplasm of skin: Secondary | ICD-10-CM

## 2024-07-13 DIAGNOSIS — D492 Neoplasm of unspecified behavior of bone, soft tissue, and skin: Secondary | ICD-10-CM

## 2024-07-13 DIAGNOSIS — L578 Other skin changes due to chronic exposure to nonionizing radiation: Secondary | ICD-10-CM | POA: Diagnosis not present

## 2024-07-13 DIAGNOSIS — Z85820 Personal history of malignant melanoma of skin: Secondary | ICD-10-CM

## 2024-07-13 DIAGNOSIS — L820 Inflamed seborrheic keratosis: Secondary | ICD-10-CM

## 2024-07-13 DIAGNOSIS — Z85828 Personal history of other malignant neoplasm of skin: Secondary | ICD-10-CM

## 2024-07-13 DIAGNOSIS — L821 Other seborrheic keratosis: Secondary | ICD-10-CM

## 2024-07-13 DIAGNOSIS — D229 Melanocytic nevi, unspecified: Secondary | ICD-10-CM

## 2024-07-13 MED ORDER — MUPIROCIN 2 % EX OINT: 1.0000 | TOPICAL_OINTMENT | Freq: Every day | CUTANEOUS | 1 refills | Status: AC

## 2024-07-13 NOTE — Patient Instructions (Addendum)

## 2024-07-13 NOTE — Progress Notes (Unsigned)
 Follow-Up Visit   Subjective  [name] is a 68 y.o. male who presents for the following: Skin Cancer Screening and Full Body Skin Exam hx of Melanoma, metastatic melanoma, BCC, Dysplastic Nevi, AKs  The patient presents for Total-Body Skin Exam (TBSE) for skin cancer screening and mole check. The patient has spots, moles and lesions to be evaluated, some may be new or changing and the patient may have concern these could be cancer.  The following portions of the chart were reviewed this encounter and updated as appropriate: medications, allergies, medical history  Review of Systems:  No other skin or systemic complaints except as noted in HPI or Assessment and Plan.  Objective  Well appearing patient in no apparent distress; mood and affect are within normal limits.  A full examination was performed including scalp, head, eyes, ears, nose, lips, neck, chest, axillae, abdomen, back, buttocks, bilateral upper extremities, bilateral lower extremities, hands, feet, fingers, toes, fingernails, and toenails. All findings within normal limits unless otherwise noted below.   Relevant physical exam findings are noted in the Assessment and Plan.  face, ears, scalp x 5 (5) Pink scaly macules L mid to low back paraspinal 0.6cm irregular brown macule  L epigastric x 1, L periumbilical x 1, L medial infrapectoral x 1, R temple x 1 (4) Stuck on waxy paps with erythema  Assessment & Plan   SKIN CANCER SCREENING PERFORMED TODAY.  ACTINIC DAMAGE - Chronic condition, secondary to cumulative UV/sun exposure - diffuse scaly erythematous macules with underlying dyspigmentation - Recommend daily broad spectrum sunscreen SPF 30+ to sun-exposed areas, reapply every 2 hours as needed.  - Staying in the shade or wearing long sleeves, sun glasses (UVA+UVB protection) and wide brim hats (4-inch brim around the entire circumference of the hat) are also recommended for sun protection.  - Call for new  or changing lesions.  LENTIGINES, SEBORRHEIC KERATOSES, HEMANGIOMAS - Benign normal skin lesions - Benign-appearing - Call for any changes  MELANOCYTIC NEVI - Tan-brown and/or pink-flesh-colored symmetric macules and papules - Benign appearing on exam today - Observation - Call clinic for new or changing moles - Recommend daily use of broad spectrum spf 30+ sunscreen to sun-exposed areas.   HISTORY OF MELANOMA Metastatic melanoma in remission Patient is no longer taking Keytruda  Left axilla + Lymph node excised by Dr. Lovenia, 07/2021 followed by Dr. Amey  Scans q 6 months Left lateral infraclavicular 11/2013    Left lat infraclavicular History of Melanoma - Left lateral infraclavicular in February 2015; S/P Wide Local excision - clear with close follow up for past 7 years.  Patient met all recommended treatment protocol and follow up. Metastatic Melanoma of left axilla diagnosed 2022 - he was being treated with Keytruda .  Oncology exam recently with scans showed pt was clear of disease. Skin is Clear today.  No lymphadenopathy.  Observe for recurrence. Call clinic for new or changing lesions.  Recommend regular skin exams, daily broad-spectrum spf 30+ sunscreen use, and photoprotection.  Metastatic melanoma (HCC) Left Axilla Axillary excision per Dr Lovenia. All scans after finishing Keytruda  have been clear. MRI of brain is clear.  CT of Chest; abdomen and pelvis is clear  - No evidence of recurrence today - No lymphadenopathy - Recommend regular full body skin exams - Recommend daily broad spectrum sunscreen SPF 30+ to sun-exposed areas, reapply every 2 hours as needed.  - Call if any new or changing lesions are noted between office visits  HISTORY OF BASAL  CELL CARCINOMA OF THE SKIN - No evidence of recurrence today - Recommend regular full body skin exams - Recommend daily broad spectrum sunscreen SPF 30+ to sun-exposed areas, reapply every 2 hours as needed.  - Call  if any new or changing lesions are noted between office visits  - R medial forehead  HISTORY OF DYSPLASTIC NEVUS No evidence of recurrence today Recommend regular full body skin exams Recommend daily broad spectrum sunscreen SPF 30+ to sun-exposed areas, reapply every 2 hours as needed.  Call if any new or changing lesions are noted between office visits  -Mid back paraspinal 1.0cm lat to spine, L lat pectoral area, L mid side, R post waistline, L paraspinal mid back, L epigastric  AK (ACTINIC KERATOSIS) (5) face, ears, scalp x 5 (5) Actinic keratoses are precancerous spots that appear secondary to cumulative UV radiation exposure/sun exposure over time. They are chronic with expected duration over 1 year. A portion of actinic keratoses will progress to squamous cell carcinoma of the skin. It is not possible to reliably predict which spots will progress to skin cancer and so treatment is recommended to prevent development of skin cancer.  Recommend daily broad spectrum sunscreen SPF 30+ to sun-exposed areas, reapply every 2 hours as needed.  Recommend staying in the shade or wearing long sleeves, sun glasses (UVA+UVB protection) and wide brim hats (4-inch brim around the entire circumference of the hat). Call for new or changing lesions. Destruction of lesion - face, ears, scalp x 5 (5) Complexity: simple   Destruction method: cryotherapy   Informed consent: discussed and consent obtained   Timeout:  patient name, date of birth, surgical site, and procedure verified Lesion destroyed using liquid nitrogen: Yes   Region frozen until ice ball extended beyond lesion: Yes   Outcome: patient tolerated procedure well with no complications   Post-procedure details: wound care instructions given    NEOPLASM OF SKIN L mid to low back paraspinal Epidermal / dermal shaving  Lesion diameter (cm):  0.6 Informed consent: discussed and consent obtained   Timeout: patient name, date of birth,  surgical site, and procedure verified   Procedure prep:  Patient was prepped and draped in usual sterile fashion Prep type:  Isopropyl alcohol Anesthesia: the lesion was anesthetized in a standard fashion   Anesthetic:  1% lidocaine  w/ epinephrine  1-100,000 buffered w/ 8.4% NaHCO3 Instrument used: flexible razor blade   Hemostasis achieved with: pressure, aluminum chloride and electrodesiccation   Outcome: patient tolerated procedure well   Post-procedure details: sterile dressing applied and wound care instructions given   Dressing type: bandage (mupirocin)    mupirocin ointment (BACTROBAN) 2 % Apply 1 Application topically daily. qd to biopsy site Specimen 1 - Surgical pathology Differential Diagnosis: Nevus vs Dysplastic Nevus  Check Margins: yes 0.6cm irregular brown macule Start Mupirocin oint qd to biopsy site INFLAMED SEBORRHEIC KERATOSIS (4) L epigastric x 1, L periumbilical x 1, L medial infrapectoral x 1, R temple x 1 (4) Symptomatic, irritating, patient would like treated. Destruction of lesion - L epigastric x 1, L periumbilical x 1, L medial infrapectoral x 1, R temple x 1 (4) Complexity: simple   Destruction method: cryotherapy   Informed consent: discussed and consent obtained   Timeout:  patient name, date of birth, surgical site, and procedure verified Lesion destroyed using liquid nitrogen: Yes   Region frozen until ice ball extended beyond lesion: Yes   Outcome: patient tolerated procedure well with no complications   Post-procedure details: wound  care instructions given    Return in about 6 months (around 01/11/2025) for TBSE, Hx of Melanoma, Hx of BCC, Hx of Dysplastic nevi, Hx of AKs.  I, Grayce Saunas, RMA, am acting as scribe for Alm Rhyme, MD .   Documentation: I have reviewed the above documentation for accuracy and completeness, and I agree with the above.  Alm Rhyme, MD

## 2024-07-14 ENCOUNTER — Encounter: Payer: Self-pay | Admitting: Dermatology

## 2024-07-15 ENCOUNTER — Encounter: Payer: Self-pay | Admitting: Dermatology

## 2024-07-15 ENCOUNTER — Other Ambulatory Visit: Payer: Self-pay

## 2024-07-15 ENCOUNTER — Ambulatory Visit: Payer: Self-pay | Admitting: Dermatology

## 2024-07-15 DIAGNOSIS — D239 Other benign neoplasm of skin, unspecified: Secondary | ICD-10-CM

## 2024-07-15 LAB — SURGICAL PATHOLOGY

## 2024-07-15 NOTE — Telephone Encounter (Addendum)
 Called and discussed bx results with patient. He  verbalized understanding. Patient would like to have surgery done by Dr. Paci. Referral sent to Dr. Corey office for surgery of severe dysplastic at left mid to low back paraspinal. ----- Message from Alm Rhyme sent at 07/15/2024  5:14 PM EDT ----- FINAL DIAGNOSIS        1. Skin, L mid to low back paraspinal :       DYSPLASTIC COMPOUND NEVUS WITH SEVERE ATYPIA, DEEP MARGIN INVOLVED, SEE       DESCRIPTION   Severe Dysplastic Schedule surgery ----- Message ----- From: Interface, Lab In Three Zero One Sent: 07/15/2024   4:54 PM EDT To: Alm JAYSON Rhyme, MD

## 2024-08-13 ENCOUNTER — Other Ambulatory Visit: Payer: Self-pay

## 2024-08-16 ENCOUNTER — Other Ambulatory Visit: Payer: Self-pay | Admitting: Internal Medicine

## 2024-09-06 ENCOUNTER — Ambulatory Visit
Admission: RE | Admit: 2024-09-06 | Discharge: 2024-09-06 | Disposition: A | Source: Ambulatory Visit | Attending: Internal Medicine

## 2024-09-06 DIAGNOSIS — C779 Secondary and unspecified malignant neoplasm of lymph node, unspecified: Secondary | ICD-10-CM

## 2024-09-06 MED ORDER — IOPAMIDOL (ISOVUE-300) INJECTION 61%: 100.0000 mL | Freq: Once | INTRAVENOUS | Status: DC | PRN

## 2024-09-07 ENCOUNTER — Inpatient Hospital Stay: Admission: RE | Admit: 2024-09-07 | Discharge: 2024-09-07 | Attending: Internal Medicine

## 2024-09-07 ENCOUNTER — Encounter: Payer: Self-pay | Admitting: Internal Medicine

## 2024-09-07 MED ORDER — IOPAMIDOL (ISOVUE-300) INJECTION 61%
100.0000 mL | Freq: Once | INTRAVENOUS | Status: AC | PRN
  Administered 2024-09-07: 100 mL via INTRAVENOUS

## 2024-09-13 ENCOUNTER — Other Ambulatory Visit: Attending: Oncology

## 2024-09-13 ENCOUNTER — Ambulatory Visit: Admitting: Internal Medicine

## 2024-09-13 DIAGNOSIS — R200 Anesthesia of skin: Secondary | ICD-10-CM | POA: Diagnosis not present

## 2024-09-13 DIAGNOSIS — I2510 Atherosclerotic heart disease of native coronary artery without angina pectoris: Secondary | ICD-10-CM | POA: Insufficient documentation

## 2024-09-13 DIAGNOSIS — G4700 Insomnia, unspecified: Secondary | ICD-10-CM | POA: Diagnosis not present

## 2024-09-13 DIAGNOSIS — I700 Atherosclerosis of aorta: Secondary | ICD-10-CM | POA: Diagnosis not present

## 2024-09-13 DIAGNOSIS — I10 Essential (primary) hypertension: Secondary | ICD-10-CM | POA: Diagnosis not present

## 2024-09-13 DIAGNOSIS — Z801 Family history of malignant neoplasm of trachea, bronchus and lung: Secondary | ICD-10-CM | POA: Diagnosis not present

## 2024-09-13 DIAGNOSIS — C773 Secondary and unspecified malignant neoplasm of axilla and upper limb lymph nodes: Secondary | ICD-10-CM | POA: Diagnosis not present

## 2024-09-13 DIAGNOSIS — Z9221 Personal history of antineoplastic chemotherapy: Secondary | ICD-10-CM | POA: Insufficient documentation

## 2024-09-13 DIAGNOSIS — Z808 Family history of malignant neoplasm of other organs or systems: Secondary | ICD-10-CM | POA: Diagnosis not present

## 2024-09-13 DIAGNOSIS — Z79899 Other long term (current) drug therapy: Secondary | ICD-10-CM | POA: Insufficient documentation

## 2024-09-13 DIAGNOSIS — C439 Malignant melanoma of skin, unspecified: Secondary | ICD-10-CM | POA: Insufficient documentation

## 2024-09-13 DIAGNOSIS — C779 Secondary and unspecified malignant neoplasm of lymph node, unspecified: Secondary | ICD-10-CM | POA: Diagnosis not present

## 2024-09-13 DIAGNOSIS — E039 Hypothyroidism, unspecified: Secondary | ICD-10-CM | POA: Diagnosis not present

## 2024-09-13 DIAGNOSIS — E271 Primary adrenocortical insufficiency: Secondary | ICD-10-CM | POA: Diagnosis not present

## 2024-09-13 LAB — CBC WITH DIFFERENTIAL (CANCER CENTER ONLY)
Abs Immature Granulocytes: 0.04 K/uL (ref 0.00–0.07)
Basophils Absolute: 0.1 K/uL (ref 0.0–0.1)
Basophils Relative: 1 %
Eosinophils Absolute: 0.1 K/uL (ref 0.0–0.5)
Eosinophils Relative: 1 %
HCT: 45.2 % (ref 39.0–52.0)
Hemoglobin: 15.5 g/dL (ref 13.0–17.0)
Immature Granulocytes: 1 %
Lymphocytes Relative: 21 %
Lymphs Abs: 1.4 K/uL (ref 0.7–4.0)
MCH: 32 pg (ref 26.0–34.0)
MCHC: 34.3 g/dL (ref 30.0–36.0)
MCV: 93.2 fL (ref 80.0–100.0)
Monocytes Absolute: 0.3 K/uL (ref 0.1–1.0)
Monocytes Relative: 5 %
Neutro Abs: 5 K/uL (ref 1.7–7.7)
Neutrophils Relative %: 71 %
Platelet Count: 226 K/uL (ref 150–400)
RBC: 4.85 MIL/uL (ref 4.22–5.81)
RDW: 12.4 % (ref 11.5–15.5)
WBC Count: 6.9 K/uL (ref 4.0–10.5)
nRBC: 0 % (ref 0.0–0.2)

## 2024-09-13 LAB — CMP (CANCER CENTER ONLY)
ALT: 16 U/L (ref 0–44)
AST: 21 U/L (ref 15–41)
Albumin: 4.3 g/dL (ref 3.5–5.0)
Alkaline Phosphatase: 54 U/L (ref 38–126)
Anion gap: 10 (ref 5–15)
BUN: 17 mg/dL (ref 8–23)
CO2: 28 mmol/L (ref 22–32)
Calcium: 9.7 mg/dL (ref 8.9–10.3)
Chloride: 107 mmol/L (ref 98–111)
Creatinine: 1.28 mg/dL — ABNORMAL HIGH (ref 0.61–1.24)
GFR, Estimated: >60 mL/min (ref >60)
Glucose, Bld: 100 mg/dL — ABNORMAL HIGH (ref 70–99)
Potassium: 4.5 mmol/L (ref 3.5–5.1)
Sodium: 144 mmol/L (ref 135–145)
Total Bilirubin: 0.4 mg/dL (ref 0.0–1.2)
Total Protein: 6.3 g/dL — ABNORMAL LOW (ref 6.5–8.1)

## 2024-09-13 NOTE — Progress Notes (Signed)
 Pt feeling well today. Has a few aches in left side /axillary area. Has been raking/blowing leaves. Appetite is good. Has a headache today. Denies night sweats recently. No fevers. Energy seems good. Pt having a Moh's procedure scheduled Weds in [place] for Dysplastic Nevus with deep margins.

## 2024-09-13 NOTE — Assessment & Plan Note (Addendum)
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  S/p neoadjuvant Keytruda  x4- -Status post axillary lymph node dissection-with residual micrometastatic disease.  Patient currently s/p  adjuvant Keytruda  x18 cycles. Dec 5th, 2025- CT CAP-  No acute finding or evidence of metastatic disease in the chest, abdomen, or pelvis;  Dominant right hepatic lobe hemangioma;    # Insomnia- sec to late night cortef - recommend taking cortef - late afternoon.   # HTN- [? Cortef ; no weight gain] on norvasc-Stable.   # Tingling and numbness in fingers- Bil UE- in AM-question carpal tunnel versus history of Dupuytren's contracture-  stable.  # Endocrinopathies: Hypothyroidism/adrenal insufficiency: [s/p evaluation- SEP 2023- Endocrinology-] iatrogenic hypothyroidism on Synthroid  112  mcg once a day; SEP 2023- TSH 0.15; Normal T3/T4-.  Adrenal insufficiency -extreme fatigue--currently on hydrocortsione 20 mg in the morning and 10 mg at night- see above re: insomnia- S/p Endocrinology appt- stable.   # Incidental findings on Imaging  CT, 2025: Aortic atherosclerosis; Coronary artery atherosclerosis.  [defer to Dr.Gilbert] Stable.I reviewed/discussed/counseled the patient.   # DISPOSITION:   # follow up in 6  months-MD; labs- cbc/cmp;thyroid  profile; CT CAP-Dr.B  # 25 minutes face-to-face with the patient discussing the above plan of care; more than 50% of time spent on prognosis/ natural history; counseling and coordination.  SABRA

## 2024-09-13 NOTE — Progress Notes (Signed)
 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   There is sufficient material for ancillary molecular testing if needed  (block A3, A1, A2).   Comment:  Per provided outside pathology report the patient had an at least pT1b  malignant melanoma of the left lateral infraclavicular skin in 2015.  Touch preparations demonstrate predominantly discohesive large  pleomorphic cells, some with binucleation.  HE sections demonstrate  lymph node parenchyma involved by metastatic neoplasm.  The neoplastic  cells are pleomorphic with binucleation, focal intranuclear inclusions,  and associated tumor necrosis. The neoplastic cells demonstrate the  following pattern of immunoreactivity:  S100: Positive  SOX10: Positive  HMB-45: Negative  Melan-A: Negative  Super pancytokeratin: Negative  CD45: Negative  This pattern of immunoreactivity supports the above diagnosis.   # NOV 7th, 2022-neoadjuvant Keytruda  cycle #1  # [id number], 26th, [name],  majority of these foci measure 1-2 mm in greatest dimension, with the largest measuring 4 mm.  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  Accompanied by his wife.   [name] 68 y.o.  male pleasant patient above history of recurrent melanoma left axillary lymphadenopathy-s/p surgery is currently status post adjuvant Keytruda -with iatrogenic hypothyroidism /adrenal insufficiency is here for follow-up/and review results of the  CAT scan.  Discussed the use of AI scribe software for clinical note transcription with the patient, who gave verbal consent to proceed.  History of Present Illness   [name] is a 68 year old male with stage III malignant melanoma, status post adjuvant immunotherapy, who presents for routine oncology follow-up and surveillance imaging review.  He is approximately three years from initial melanoma diagnosis and has completed adjuvant immunotherapy. He is currently on a six-month surveillance schedule. He reports no new or concerning symptoms related to melanoma and remains physically active, including a recent 20-mile hike and preparation for ski season. He denies chest pain, dyspnea, or other cardiopulmonary symptoms. He describes only mild musculoskeletal discomfort with activity, which he attributes to age. He is asymptomatic and not seeking further cardiac evaluation.  Two months ago, a lesion was identified  on his back during routine dermatologic evaluation and excised,  with pathology confirming squamous cell carcinoma. He is scheduled for Mohs surgery this week and has had no complications or symptoms related to the lesion since excision.  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 He notes mood changes, specifically difficulty controlling anger, which he attributes to steroid therapy. A prior attempt at dose reduction was unsuccessful. He also takes Protonix for GERD and remains stable on this regimen. He denies ongoing headaches, though he experienced mild tension-type discomfort in the neck and head region earlier today, attributed to stress. He continues annual endocrinology follow-up, with his next appointment anticipated in April.      Review of Systems  Constitutional:  Negative for chills, diaphoresis, fever, malaise/fatigue and weight loss.  HENT:  Negative for nosebleeds and sore throat.   Eyes:  Negative for double vision.  Respiratory:  Negative for hemoptysis, sputum production, shortness of breath and wheezing.   Cardiovascular:  Negative for chest pain, palpitations, orthopnea and leg swelling.  Gastrointestinal:  Negative for abdominal pain, blood in stool, constipation, diarrhea, heartburn, melena, nausea and vomiting.  Genitourinary:  Negative for dysuria, frequency and urgency.  Musculoskeletal:  Positive for myalgias. Negative for back pain and joint pain.  Skin: Negative.  Negative for itching and rash.  Neurological:  Positive for tingling. Negative for dizziness, focal weakness and weakness.  Endo/Heme/Allergies:  Does not bruise/bleed easily.  Psychiatric/Behavioral:  Negative for depression. The patient is not nervous/anxious and does not have insomnia.       PAST MEDICAL HISTORY :  Past Medical History:  Diagnosis Date   Basal cell carcinoma 03/10/2013   Right medial forehead. Excised, margins free.   Dysplastic  nevus 11/05/2006   Mid back paraspinal, [name], close to edge. Excised 12/24/2006, margins free.   Dysplastic nevus 12/15/2008   Left lat. pectoral area. Moderate atypia, extends to one edge.    Dysplastic nevus 12/07/2009   Left mid side. Moderate atypia, close to margin.   Dysplastic nevus 12/07/2009   Right posterior waistline. Moderate atypia, close to margin.   Dysplastic nevus 02/10/2014   Left paraspinal mid back. Moderate atypia, lateral and deep margin involved.    Dysplastic nevus 06/16/2014   Left epigastric. Mild atypia, deep margin involved.    Dysplastic nevus 07/13/2024   left mid to low back paraspinal - severe atypia - pending surgery with Dr. Corey   GERD (gastroesophageal reflux disease)    Hx of basal cell carcinoma    multiple sites   Hx of malignant melanoma 11/11/2013   L lateral infraclavicular, superficial spreading, Breslow's 0.25mm, Clark's level III   Hypothyroidism    Melanoma (HCC) 11/11/2013   Left lateral infraclavicular. MM, superficial spreading. Anatomic level III. Tumor thickness 0.76mm. Excised 11/24/2013, margins free.    Metastatic melanoma (HCC) 07/2021   L axilla, excised by Dr. Lovenia, followd by Dr. Amey with Keytruda     PAST SURGICAL HISTORY :   Past Surgical History:  Procedure Laterality Date   AXILLARY LYMPH NODE DISSECTION Left 11/02/2021   Procedure: AXILLARY LYMPH NODE DISSECTION;  Surgeon: Dessa Reyes ORN, MD;  [name];  Service: General;  Laterality: Left;   COLONOSCOPY WITH PROPOFOL  N/A 01/20/2017   Procedure: COLONOSCOPY WITH PROPOFOL ;  Surgeon: Gladis RAYMOND Mariner, MD;  Location: [facility] ENDOSCOPY;  Service: Endoscopy;  [name];   ESOPHAGOGASTRODUODENOSCOPY (EGD) WITH ESOPHAGEAL DILATION  MELANOMA EXCISION Right 11/24/2013   lateral infraclavicular   WISDOM TOOTH EXTRACTION     WRIST SURGERY Left 2022    FAMILY HISTORY :   Family History  Problem Relation Age  of Onset   Macular degeneration Mother    Hyperlipidemia Mother    Melanoma Mother 48       rt leg   Heart disease Father    Heart attack Father    Lung cancer Father        metastasized    SOCIAL HISTORY:   Social History   Tobacco Use   Smoking status: Never   Smokeless tobacco: Never  Vaping Use   Vaping status: Never Used  Substance Use Topics   Alcohol use: Yes    Comment: about 1-3 glass of wine with dinner   Drug use: No    ALLERGIES:  has no known allergies.  MEDICATIONS:  Current Outpatient Medications  Medication Sig Dispense Refill   amLODipine (NORVASC) 5 MG tablet Take 5 mg by mouth daily.     aspirin 81 MG tablet Take 81 mg by mouth daily.     hydrocortisone  (CORTEF ) 10 MG tablet TAKE 2 TABLETS IN THE MORNING AND 1 TABLET AT NIGHT. TAKE WITH FOOD AS DIRECTED . 90 tablet 1   levothyroxine  (SYNTHROID ) 112 MCG tablet Take 112 mcg by mouth daily before breakfast.     Multiple Vitamins tablet Take 1 tablet by mouth daily.     Multiple Vitamins-Minerals (PRESERVISION AREDS 2 PO) Take 1 capsule by mouth in the morning and at bedtime.     mupirocin  ointment (BACTROBAN ) 2 % Apply 1 Application topically daily. qd to biopsy site 22 g 1   pantoprazole (PROTONIX) 20 MG tablet Take 20 mg by mouth daily.  0   No current facility-administered medications for this visit.    PHYSICAL EXAMINATION: ECOG PERFORMANCE STATUS: 0 - Asymptomatic  BP 131/75 (BP Location: Right Arm, Patient Position: Sitting)   Pulse 66   Temp (!) 97 F (36.1 C) (Tympanic)   Resp 17   Ht 6' 1 (1.854 m)   Wt 195 lb (88.5 kg)   SpO2 100%   BMI 25.73 kg/m   Filed Weights   09/13/24 1455  Weight: 195 lb (88.5 kg)      Physical Exam Vitals and nursing note reviewed.  HENT:     Head: Normocephalic and atraumatic.     Mouth/Throat:     Pharynx: Oropharynx is clear.  Eyes:     Extraocular Movements: Extraocular movements intact.     Pupils: Pupils are equal, round, and reactive to  light.  Cardiovascular:     Rate and Rhythm: Normal rate and regular rhythm.  Pulmonary:     Comments: Decreased breath sounds bilaterally.  Abdominal:     Palpations: Abdomen is soft.  Musculoskeletal:        General: Normal range of motion.     Cervical back: Normal range of motion.  Skin:    General: Skin is warm.  Neurological:     General: No focal deficit present.     Mental Status: He is alert and oriented to person, place, and time.  Psychiatric:        Behavior: Behavior normal.        Judgment: Judgment normal.        LABORATORY DATA:  I have reviewed the data as listed    Component Value Date/Time   NA 144 09/13/2024 1431   NA 144 05/16/2022  0953   K 4.5 09/13/2024 1431   CL 107 09/13/2024 1431   CO2 28 09/13/2024 1431   GLUCOSE 100 (H) 09/13/2024 1431   BUN 17 09/13/2024 1431   BUN 12 05/16/2022 0953   CREATININE 1.28 (H) 09/13/2024 1431   CALCIUM 9.7 09/13/2024 1431   PROT 6.3 (L) 09/13/2024 1431   PROT 5.7 (L) 05/16/2022 0953   ALBUMIN 4.3 09/13/2024 1431   ALBUMIN 3.9 05/16/2022 0953   AST 21 09/13/2024 1431   ALT 16 09/13/2024 1431   ALKPHOS 54 09/13/2024 1431   BILITOT 0.4 09/13/2024 1431   GFRNONAA >60 09/13/2024 1431   GFRAA 75 05/10/2020 1017    No results found for: SPEP, UPEP  Lab Results  Component Value Date   WBC 6.9 09/13/2024   NEUTROABS 5.0 09/13/2024   HGB 15.5 09/13/2024   HCT 45.2 09/13/2024   MCV 93.2 09/13/2024   PLT 226 09/13/2024      Chemistry      Component Value Date/Time   NA 144 09/13/2024 1431   NA 144 05/16/2022 0953   K 4.5 09/13/2024 1431   CL 107 09/13/2024 1431   CO2 28 09/13/2024 1431   BUN 17 09/13/2024 1431   BUN 12 05/16/2022 0953   CREATININE 1.28 (H) 09/13/2024 1431   GLU 96 10/19/2014 0000      Component Value Date/Time   CALCIUM 9.7 09/13/2024 1431   ALKPHOS 54 09/13/2024 1431   AST 21 09/13/2024 1431   ALT 16 09/13/2024 1431   BILITOT 0.4 09/13/2024 1431       RADIOGRAPHIC  STUDIES: I have personally reviewed the radiological images as listed and agreed with the findings in the report. No results found.   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  S/p neoadjuvant Keytruda  x4- -Status post axillary lymph node dissection-with residual micrometastatic disease.  Patient currently s/p  adjuvant Keytruda  x18 cycles. Dec 5th, 2025- CT CAP-  No acute finding or evidence of metastatic disease in the chest, abdomen, or pelvis;  Dominant right hepatic lobe hemangioma;    # Insomnia- sec to late night cortef - recommend taking cortef - late afternoon.   # HTN- [? Cortef ; no weight gain] on norvasc-Stable.   # Tingling and numbness in fingers- Bil UE- in AM-question carpal tunnel versus history of Dupuytren's contracture-  stable.  # Endocrinopathies: Hypothyroidism/adrenal insufficiency: [s/p evaluation- SEP 2023- Endocrinology-] iatrogenic hypothyroidism on Synthroid  112  mcg once a day; SEP 2023- TSH 0.15; Normal T3/T4-.  Adrenal insufficiency -extreme fatigue--currently on hydrocortsione 20 mg in the morning and 10 mg at night- see above re: insomnia- S/p Endocrinology appt- stable.   # Incidental findings on Imaging  CT, 2025: Aortic atherosclerosis; Coronary artery atherosclerosis.  [defer to Dr.Gilbert] Stable.I reviewed/discussed/counseled the patient.   # DISPOSITION:   # follow up in 6  months-MD; labs- cbc/cmp;thyroid  profile; CT CAP-Dr.B  # 25 minutes face-to-face with the patient discussing the above plan of care; more than 50% of time spent on prognosis/ natural history; counseling and coordination.  .       Orders Placed This Encounter  Procedures   CT CHEST ABDOMEN PELVIS W CONTRAST    Standing Status:   Future    Expected Date:   03/14/2025    Expiration Date:   09/13/2025    If indicated for the ordered procedure, I authorize the administration of contrast media  per Radiology protocol:   Yes  Does the patient have a contrast media/X-ray dye allergy?:   No    Preferred imaging location?:   [facility]    If indicated for the ordered procedure, I authorize the administration of oral contrast media per Radiology protocol:   Yes   CBC with Differential (Cancer Center Only)    Standing Status:   Future    Expected Date:   03/14/2025    Expiration Date:   06/12/2025   CMP (Cancer Center only)    Standing Status:   Future    Expected Date:   03/14/2025    Expiration Date:   06/12/2025   Thyroid  Panel With TSH    Standing Status:   Future    Expected Date:   03/14/2025    Expiration Date:   06/12/2025    All questions were answered. The patient knows to call the clinic with any problems, questions or concerns.      Cindy JONELLE Joe, MD 09/13/2024 3:20 PM

## 2024-09-14 ENCOUNTER — Other Ambulatory Visit: Payer: Self-pay

## 2024-09-14 ENCOUNTER — Encounter: Payer: Self-pay | Admitting: Oncology

## 2024-09-14 ENCOUNTER — Telehealth: Payer: Self-pay | Admitting: Internal Medicine

## 2024-09-14 ENCOUNTER — Ambulatory Visit: Payer: Self-pay | Admitting: Internal Medicine

## 2024-09-14 ENCOUNTER — Encounter: Payer: Self-pay | Admitting: Internal Medicine

## 2024-09-14 LAB — THYROID PANEL WITH TSH
Free Thyroxine Index: 2.5 (ref 1.2–4.9)
T3 Uptake Ratio: 30 % (ref 24–39)
T4, Total: 8.3 ug/dL (ref 4.5–12.0)
TSH: 0.319 u[IU]/mL — ABNORMAL LOW (ref 0.450–4.500)

## 2024-09-14 NOTE — Telephone Encounter (Signed)
 Called pt to sched CT - confirmed date/time/location w/pt - LH

## 2024-09-15 ENCOUNTER — Encounter: Payer: Self-pay | Admitting: Dermatology

## 2024-09-15 ENCOUNTER — Ambulatory Visit (INDEPENDENT_AMBULATORY_CARE_PROVIDER_SITE_OTHER): Admitting: Dermatology

## 2024-09-15 VITALS — BP 120/71 | HR 65

## 2024-09-15 DIAGNOSIS — D225 Melanocytic nevi of trunk: Secondary | ICD-10-CM | POA: Diagnosis not present

## 2024-09-15 DIAGNOSIS — D239 Other benign neoplasm of skin, unspecified: Secondary | ICD-10-CM

## 2024-09-15 NOTE — Patient Instructions (Signed)

## 2024-09-15 NOTE — Progress Notes (Signed)
 Follow-Up Visit   Subjective  [name] is a 68 y.o. male who presents for the following: Excision of a DN moderate to severe on the left mid to low back paraspinal, referred by Dr. Hester.   The following portions of the chart were reviewed this encounter and updated as appropriate: medications, allergies, medical history  Review of Systems:  No other skin or systemic complaints except as noted in HPI or Assessment and Plan.  Objective  Well appearing patient in no apparent distress; mood and affect are within normal limits.  A focused examination was performed of the following areas: Left mid to low back paraspinal Relevant physical exam findings are noted in the Assessment and Plan.   Left Lower Back Biopsy scar   Assessment & Plan   DYSPLASTIC NEVUS Left Lower Back Skin excision - Left Lower Back  Excision method:  elliptical Lesion length (cm):  1.1 Lesion width (cm):  0.9 Margin per side (cm):  0.5 Total excision diameter (cm):  2.1 Informed consent: discussed and consent obtained   Timeout: patient name, date of birth, surgical site, and procedure verified   Procedure prep:  Patient was prepped and draped in usual sterile fashion Prep type:  Chlorhexidine  Anesthesia: the lesion was anesthetized in a standard fashion   Anesthetic:  1% lidocaine  w/ epinephrine  1-100,000 buffered w/ 8.4% NaHCO3 Instrument used: #15 blade   Hemostasis achieved with: suture, pressure and electrodesiccation   Hemostasis achieved with comment:  3.0 PDS with dermabond and steri strips Outcome: patient tolerated procedure well with no complications   Post-procedure details: sterile dressing applied and wound care instructions given   Dressing type: bandage and pressure dressing   Additional details:  Final length 5.2  Skin repair - Left Lower Back Complexity:  Complex Final length (cm):  5.2 Informed consent: discussed and consent obtained   Timeout: patient name, date of  birth, surgical site, and procedure verified   Procedure prep:  Patient was prepped and draped in usual sterile fashion Prep type:  Chlorhexidine  Anesthesia: the lesion was anesthetized in a standard fashion   Anesthetic:  1% lidocaine  w/ epinephrine  1-100,000 buffered w/ 8.4% NaHCO3 Reason for type of repair: reduce tension to allow closure, preserve normal anatomy and preserve normal anatomical and functional relationships   Undermining: area extensively undermined   Subcutaneous layers (deep stitches):  Suture size:  3-0 Suture type: PDS (polydioxanone)   Stitches:  Buried vertical mattress Fine/surface layer approximation (top stitches):  Suture type: cyanoacrylate tissue glue   Hemostasis achieved with: suture, pressure and electrodesiccation Outcome: patient tolerated procedure well with no complications   Post-procedure details: sterile dressing applied and wound care instructions given   Dressing type: bandage and pressure dressing    Specimen 1 - Surgical pathology Differential Diagnosis: DN [id number] Check Margins: No  The surgical wound was then cleaned, prepped, and re-anesthetized as above. Wound edges were undermined extensively along at least one entire edge and at a distance equal to or greater than the width of the defect (see wound defect size above) in order to achieve closure and decrease wound tension and anatomic distortion. Redundant tissue repair including standing cone removal was performed. Hemostasis was achieved with electrocautery. Subcutaneous and epidermal tissues were approximated with the above sutures. The surgical site was then lightly scrubbed with sterile, saline-soaked gauze. Steri-strips were applied, and the area was then bandaged using Vaseline ointment, non-adherent gauze, gauze pads, and tape to provide an adequate pressure dressing. The patient tolerated the  procedure well, was given detailed written and verbal wound care instructions, and was  discharged in good condition.   The patient will follow-up: PRN.  Return if symptoms worsen or fail to improve.  I, Darice Smock, CMA, am acting as scribe for RUFUS CHRISTELLA HOLY, MD.   Documentation: I have reviewed the above documentation for accuracy and completeness, and I agree with the above.  RUFUS CHRISTELLA HOLY, MD

## 2024-09-16 LAB — SURGICAL PATHOLOGY

## 2024-09-17 ENCOUNTER — Ambulatory Visit: Payer: Self-pay | Admitting: Dermatology

## 2024-10-17 ENCOUNTER — Other Ambulatory Visit: Payer: Self-pay | Admitting: Internal Medicine

## 2024-10-18 ENCOUNTER — Encounter: Payer: Self-pay | Admitting: Internal Medicine

## 2025-01-12 ENCOUNTER — Ambulatory Visit: Admitting: Dermatology

## 2025-03-01 ENCOUNTER — Ambulatory Visit: Admitting: Dermatology

## 2025-03-07 ENCOUNTER — Other Ambulatory Visit

## 2025-03-14 ENCOUNTER — Inpatient Hospital Stay: Admitting: Internal Medicine

## 2025-03-14 ENCOUNTER — Inpatient Hospital Stay
